# Patient Record
Sex: Female | Born: 1940 | Race: White | Hispanic: No | Smoking: Never smoker
Health system: Southern US, Community
[De-identification: ages and names within clinical notes are randomized; demographics above are authoritative.]

## PROBLEM LIST (undated history)

## (undated) DIAGNOSIS — IMO0001 Reserved for inherently not codable concepts without codable children: Secondary | ICD-10-CM

## (undated) DIAGNOSIS — I5189 Other ill-defined heart diseases: Secondary | ICD-10-CM

## (undated) DIAGNOSIS — I2721 Secondary pulmonary arterial hypertension: Secondary | ICD-10-CM

## (undated) DIAGNOSIS — Z87442 Personal history of urinary calculi: Secondary | ICD-10-CM

## (undated) DIAGNOSIS — I10 Essential (primary) hypertension: Secondary | ICD-10-CM

## (undated) DIAGNOSIS — M1990 Unspecified osteoarthritis, unspecified site: Secondary | ICD-10-CM

## (undated) DIAGNOSIS — N6320 Unspecified lump in the left breast, unspecified quadrant: Secondary | ICD-10-CM

## (undated) DIAGNOSIS — D649 Anemia, unspecified: Secondary | ICD-10-CM

## (undated) DIAGNOSIS — R079 Chest pain, unspecified: Secondary | ICD-10-CM

## (undated) DIAGNOSIS — F419 Anxiety disorder, unspecified: Secondary | ICD-10-CM

## (undated) DIAGNOSIS — I4510 Unspecified right bundle-branch block: Secondary | ICD-10-CM

## (undated) DIAGNOSIS — G4730 Sleep apnea, unspecified: Secondary | ICD-10-CM

## (undated) DIAGNOSIS — R002 Palpitations: Secondary | ICD-10-CM

## (undated) DIAGNOSIS — K219 Gastro-esophageal reflux disease without esophagitis: Secondary | ICD-10-CM

## (undated) DIAGNOSIS — E785 Hyperlipidemia, unspecified: Secondary | ICD-10-CM

## (undated) DIAGNOSIS — J302 Other seasonal allergic rhinitis: Secondary | ICD-10-CM

## (undated) HISTORY — DX: Palpitations: R00.2

## (undated) HISTORY — DX: Unspecified right bundle-branch block: I45.10

## (undated) HISTORY — DX: Secondary pulmonary arterial hypertension: I27.21

## (undated) HISTORY — DX: Chest pain, unspecified: R07.9

## (undated) HISTORY — DX: Reserved for inherently not codable concepts without codable children: IMO0001

## (undated) HISTORY — DX: Gastro-esophageal reflux disease without esophagitis: K21.9

## (undated) HISTORY — PX: OTHER SURGICAL HISTORY: SHX169

## (undated) HISTORY — DX: Other ill-defined heart diseases: I51.89

## (undated) HISTORY — DX: Essential (primary) hypertension: I10

## (undated) HISTORY — PX: SHOULDER SURGERY: SHX246

---

## 2000-04-23 ENCOUNTER — Encounter: Admission: RE | Admit: 2000-04-23 | Discharge: 2000-07-22 | Payer: Self-pay | Admitting: Family Medicine

## 2000-07-05 ENCOUNTER — Other Ambulatory Visit: Admission: RE | Admit: 2000-07-05 | Discharge: 2000-07-05 | Payer: Self-pay | Admitting: Family Medicine

## 2000-07-09 ENCOUNTER — Encounter: Admission: RE | Admit: 2000-07-09 | Discharge: 2000-07-09 | Payer: Self-pay | Admitting: Family Medicine

## 2000-07-09 ENCOUNTER — Encounter: Payer: Self-pay | Admitting: Family Medicine

## 2000-08-22 ENCOUNTER — Encounter: Payer: Self-pay | Admitting: Urology

## 2000-08-22 ENCOUNTER — Ambulatory Visit (HOSPITAL_COMMUNITY): Admission: RE | Admit: 2000-08-22 | Discharge: 2000-08-22 | Payer: Self-pay | Admitting: Urology

## 2002-01-07 ENCOUNTER — Other Ambulatory Visit: Admission: RE | Admit: 2002-01-07 | Discharge: 2002-01-07 | Payer: Self-pay | Admitting: Family Medicine

## 2002-04-22 ENCOUNTER — Encounter: Payer: Self-pay | Admitting: Family Medicine

## 2002-04-22 ENCOUNTER — Encounter: Admission: RE | Admit: 2002-04-22 | Discharge: 2002-04-22 | Payer: Self-pay | Admitting: Family Medicine

## 2003-10-07 ENCOUNTER — Encounter: Payer: Self-pay | Admitting: Family Medicine

## 2003-10-07 ENCOUNTER — Encounter: Admission: RE | Admit: 2003-10-07 | Discharge: 2003-10-07 | Payer: Self-pay | Admitting: Family Medicine

## 2004-05-26 ENCOUNTER — Ambulatory Visit (HOSPITAL_COMMUNITY): Admission: RE | Admit: 2004-05-26 | Discharge: 2004-05-26 | Payer: Self-pay | Admitting: Urology

## 2004-06-30 ENCOUNTER — Ambulatory Visit (HOSPITAL_COMMUNITY): Admission: RE | Admit: 2004-06-30 | Discharge: 2004-06-30 | Payer: Self-pay | Admitting: Urology

## 2007-02-11 ENCOUNTER — Encounter: Admission: RE | Admit: 2007-02-11 | Discharge: 2007-02-11 | Payer: Self-pay | Admitting: Family Medicine

## 2007-06-26 ENCOUNTER — Ambulatory Visit (HOSPITAL_BASED_OUTPATIENT_CLINIC_OR_DEPARTMENT_OTHER): Admission: RE | Admit: 2007-06-26 | Discharge: 2007-06-26 | Payer: Self-pay | Admitting: Orthopedic Surgery

## 2011-03-21 ENCOUNTER — Other Ambulatory Visit: Payer: Self-pay | Admitting: Podiatry

## 2011-03-21 DIAGNOSIS — M25571 Pain in right ankle and joints of right foot: Secondary | ICD-10-CM

## 2011-03-24 ENCOUNTER — Ambulatory Visit
Admission: RE | Admit: 2011-03-24 | Discharge: 2011-03-24 | Disposition: A | Discharge status: Home / Self Care | Payer: BLUE CROSS/BLUE SHIELD | Source: Ambulatory Visit | Attending: Podiatry | Admitting: Podiatry

## 2011-03-24 DIAGNOSIS — M25571 Pain in right ankle and joints of right foot: Secondary | ICD-10-CM

## 2011-05-02 NOTE — Op Note (Signed)
NAME:  Laura Garner, Laura Garner                  ACCOUNT NO.:  [id number]   MEDICAL RECORD NO.:  [id number]          PATIENT TYPE:  AMB   LOCATION:  DSC                          FACILITY:  [facility]   PHYSICIAN:  Mila Homer. Sherlean Foot, M.D. DATE OF BIRTH:  27-Aug-1941   DATE OF PROCEDURE:  06/26/2007  DATE OF DISCHARGE:                               OPERATIVE REPORT   SURGEON:  Mila Homer. Sherlean Foot, M.D.   ASSISTANT:  None.   ANESTHESIA:  General.   PREOPERATIVE DIAGNOSIS:  Left shoulder impingement syndrome and  osteoarthritis.   POSTOPERATIVE DIAGNOSIS:  Left shoulder impingement syndrome and  osteoarthritis.   PROCEDURE:  Left shoulder arthroscopy with subacromial decompression,  glenohumeral debridement and distal clavicle resection.   INDICATIONS FOR PROCEDURE:  The patient is a 70 year old white female  with failure of conservative measures for shoulder impingement syndrome.  Informed consent obtained.   DESCRIPTION OF PROCEDURE:  The patient was taken to operating room,  administered general anesthesia, placed in beach-chair position.  Left  shoulder was prepped and draped in the usual sterile fashion.  Anterior-  posterior and direct lateral portals were created with a #11 blade,  blunt trocar and cannula.  Diagnostic arthroscopy revealed some  undersurface tearing of the rotator cuff.  Of note is that she did have  arthrofibrosis as well and I did perform a preoperative manipulation.  I  then debrided some of the scar tissue in the joint.  At this point I  redirected scope into the subacromial space looking from the direct  lateral portal, performed a subacromial decompression by removing the  bursa and then used the 4.0 mm cylindrical bur to perform an  anterolateral acromioplasty and distal clavicle resection.  I used the  ArthroCare debridement wand to clean off the undersurface of the bone as  well as released the CA ligament.  I then lavaged and closed with 4-0  nylon sutures, dressed  with Xeroform dressing sponges, 2-inch silk tape  and a simple sling.   COMPLICATIONS:  None.   DRAINS:  None.           ______________________________  Mila Homer. Sherlean Foot, M.D.     SDL/MEDQ  D:  06/26/2007  T:  06/26/2007  Job:  [id number]

## 2011-05-05 NOTE — Op Note (Signed)
NAME:  REESHEMAH, NAZARYAN                            ACCOUNT NO.:  [id number]   MEDICAL RECORD NO.:  [id number]                   PATIENT TYPE:  AMB   LOCATION:  DAY                                  FACILITY:  [facility]   PHYSICIAN:  Maretta Bees. Vonita Moss, M.D.             DATE OF BIRTH:  [birth year]   DATE OF PROCEDURE:  05/26/2004  DATE OF DISCHARGE:                                 OPERATIVE REPORT   PREOPERATIVE DIAGNOSES:  Large right renal calculi.   POSTOPERATIVE DIAGNOSES:  Large right renal calculi.   PROCEDURE:  Cystoscopy, right retrograde pyelogram with interpretation and  right double J catheter placement.   SURGEON:  Maretta Bees. Vonita Moss, M.D.   ANESTHESIA:  General.   INDICATIONS FOR PROCEDURE:  This 70 year old lady has had right flank pain  and history of stones and microscopic hematuria. She has got large right  renal calculi that looked fairly soft, I believe these need removal and  discussion of percutaneous nephrolithotomy versus ESL's was discussed and  she opted for an ESL as her initial approach. With the stone burden, I felt  like she needed a right double J catheter placed before ESL and therefore  she is brought to the OR this morning.   DESCRIPTION OF PROCEDURE:  The patient was brought to the operating room,  placed in lithotomy position, external genitalia were prepped and draped in  the usual fashion.  She was cystoscoped and the bladder was unremarkable.  A  guidewire was placed up the right ureter and then an open ended ureteral  catheter was positioned and a retrograde pyelogram obtained demonstrating  these stones in the pyelocaliceal system and a slightly dilated right upper  ureter.  A 26 cm 6 [place] double J catheter was then placed easily in  position with a full coil in the renal pelvis and a full coil in the  bladder. The bladder was emptied, the scope removed and the patient sent to  the recovery room in good condition having tolerated the procedure well  and  she will undergo ESL later this morning.                                               Maretta Bees. Vonita Moss, M.D.    LJP/MEDQ  D:  05/26/2004  T:  05/26/2004  Job:  [id number]

## 2011-05-05 NOTE — Op Note (Signed)
[place]  Patient:    Laura Garner, Laura Garner                         MRN: [id number] Proc. Date: 08/22/00 Adm. Date:  [id number] Attending:  Lauree Chandler                           Operative Report  PREOPERATIVE DIAGNOSIS:  Microhematuria.  POSTOPERATIVE DIAGNOSIS:  Microhematuria and mild urethral stenosis.  OPERATION PERFORMED:  Cystoscopy, urethral dilation, and bilateral retrograde pyelograms with interpretation.  SURGEON:  Maretta Bees. Vonita Moss, M.D.  ANESTHESIA:  General.  INDICATIONS FOR PROCEDURE:  This 70 year old lady has had persistent microscopic hematuria.  She is allergic to IV contrast.  She has had renal ultrasounds that have shown no masses, stones or hydronephrosis.  Cystoscopy earlier this year was negative.  Because of persistent red blood cells, she is brought to the operating room today to evaluate her upper tracts by means of retrograde pyelograms.  She also has normal renal function.  DESCRIPTION OF PROCEDURE:  The patient was brought to the operating room and placed in lithotomy position.  The external genitalia were prepped and draped in the usual fashion.  The cystoscope was snug, so I dilated her to 36 [place] with female sounds and then inserted the 25 [place] cystoscope.  The bladder had no stones, tumors or inflammatory lesions.  Bilateral retrograde pyelograms were obtained under fluoroscopic control using a 5 French whistle tip ureteral catheter.  The upper tracts were not obstructed with no filling defects or other lesions.  The bladder was then emptied, the scope removed. The patient was transferred to the recovery room in good condition. DD:  08/22/00 TD:  08/22/00 Job: [id number]

## 2011-07-27 DIAGNOSIS — R002 Palpitations: Secondary | ICD-10-CM

## 2011-07-27 DIAGNOSIS — IMO0001 Reserved for inherently not codable concepts without codable children: Secondary | ICD-10-CM

## 2011-07-27 DIAGNOSIS — E119 Type 2 diabetes mellitus without complications: Secondary | ICD-10-CM | POA: Insufficient documentation

## 2011-07-27 DIAGNOSIS — M66879 Spontaneous rupture of other tendons, unspecified ankle and foot: Secondary | ICD-10-CM

## 2011-07-27 DIAGNOSIS — I10 Essential (primary) hypertension: Secondary | ICD-10-CM

## 2011-10-03 LAB — BASIC METABOLIC PANEL
BUN: 24 — ABNORMAL HIGH
Creatinine, Ser: 1.41 — ABNORMAL HIGH
GFR calc non Af Amer: 37 — ABNORMAL LOW

## 2011-10-03 LAB — POCT HEMOGLOBIN-HEMACUE
Hemoglobin: 10.2 — ABNORMAL LOW
Operator id: 116011

## 2012-01-21 DIAGNOSIS — J069 Acute upper respiratory infection, unspecified: Secondary | ICD-10-CM | POA: Diagnosis not present

## 2012-03-12 DIAGNOSIS — Z79899 Other long term (current) drug therapy: Secondary | ICD-10-CM | POA: Diagnosis not present

## 2012-03-12 DIAGNOSIS — E1149 Type 2 diabetes mellitus with other diabetic neurological complication: Secondary | ICD-10-CM | POA: Diagnosis not present

## 2012-03-12 DIAGNOSIS — E782 Mixed hyperlipidemia: Secondary | ICD-10-CM | POA: Diagnosis not present

## 2012-03-18 DIAGNOSIS — E782 Mixed hyperlipidemia: Secondary | ICD-10-CM | POA: Diagnosis not present

## 2012-03-18 DIAGNOSIS — I10 Essential (primary) hypertension: Secondary | ICD-10-CM | POA: Diagnosis not present

## 2012-03-18 DIAGNOSIS — E1149 Type 2 diabetes mellitus with other diabetic neurological complication: Secondary | ICD-10-CM | POA: Diagnosis not present

## 2012-03-28 DIAGNOSIS — M9981 Other biomechanical lesions of cervical region: Secondary | ICD-10-CM | POA: Diagnosis not present

## 2012-03-28 DIAGNOSIS — M5137 Other intervertebral disc degeneration, lumbosacral region: Secondary | ICD-10-CM | POA: Diagnosis not present

## 2012-03-28 DIAGNOSIS — M999 Biomechanical lesion, unspecified: Secondary | ICD-10-CM | POA: Diagnosis not present

## 2012-04-10 DIAGNOSIS — I10 Essential (primary) hypertension: Secondary | ICD-10-CM | POA: Diagnosis not present

## 2012-04-10 DIAGNOSIS — R609 Edema, unspecified: Secondary | ICD-10-CM | POA: Diagnosis not present

## 2012-06-05 DIAGNOSIS — M999 Biomechanical lesion, unspecified: Secondary | ICD-10-CM | POA: Diagnosis not present

## 2012-06-05 DIAGNOSIS — M9981 Other biomechanical lesions of cervical region: Secondary | ICD-10-CM | POA: Diagnosis not present

## 2012-06-05 DIAGNOSIS — M5412 Radiculopathy, cervical region: Secondary | ICD-10-CM | POA: Diagnosis not present

## 2012-06-13 DIAGNOSIS — M5412 Radiculopathy, cervical region: Secondary | ICD-10-CM | POA: Diagnosis not present

## 2012-06-13 DIAGNOSIS — M999 Biomechanical lesion, unspecified: Secondary | ICD-10-CM | POA: Diagnosis not present

## 2012-06-13 DIAGNOSIS — M9981 Other biomechanical lesions of cervical region: Secondary | ICD-10-CM | POA: Diagnosis not present

## 2012-06-25 DIAGNOSIS — M999 Biomechanical lesion, unspecified: Secondary | ICD-10-CM | POA: Diagnosis not present

## 2012-06-25 DIAGNOSIS — M5412 Radiculopathy, cervical region: Secondary | ICD-10-CM | POA: Diagnosis not present

## 2012-06-25 DIAGNOSIS — M9981 Other biomechanical lesions of cervical region: Secondary | ICD-10-CM | POA: Diagnosis not present

## 2012-06-27 DIAGNOSIS — M5137 Other intervertebral disc degeneration, lumbosacral region: Secondary | ICD-10-CM | POA: Diagnosis not present

## 2012-06-27 DIAGNOSIS — M542 Cervicalgia: Secondary | ICD-10-CM | POA: Diagnosis not present

## 2012-07-18 DIAGNOSIS — E782 Mixed hyperlipidemia: Secondary | ICD-10-CM | POA: Diagnosis not present

## 2012-07-18 DIAGNOSIS — Z79899 Other long term (current) drug therapy: Secondary | ICD-10-CM | POA: Diagnosis not present

## 2012-07-18 DIAGNOSIS — E1149 Type 2 diabetes mellitus with other diabetic neurological complication: Secondary | ICD-10-CM | POA: Diagnosis not present

## 2012-07-18 DIAGNOSIS — D649 Anemia, unspecified: Secondary | ICD-10-CM | POA: Diagnosis not present

## 2012-07-25 DIAGNOSIS — E7800 Pure hypercholesterolemia, unspecified: Secondary | ICD-10-CM | POA: Diagnosis not present

## 2012-07-25 DIAGNOSIS — I10 Essential (primary) hypertension: Secondary | ICD-10-CM | POA: Diagnosis not present

## 2012-07-25 DIAGNOSIS — E1149 Type 2 diabetes mellitus with other diabetic neurological complication: Secondary | ICD-10-CM | POA: Diagnosis not present

## 2012-07-25 DIAGNOSIS — D649 Anemia, unspecified: Secondary | ICD-10-CM | POA: Diagnosis not present

## 2012-08-29 DIAGNOSIS — M9981 Other biomechanical lesions of cervical region: Secondary | ICD-10-CM | POA: Diagnosis not present

## 2012-08-29 DIAGNOSIS — M5412 Radiculopathy, cervical region: Secondary | ICD-10-CM | POA: Diagnosis not present

## 2012-08-29 DIAGNOSIS — M999 Biomechanical lesion, unspecified: Secondary | ICD-10-CM | POA: Diagnosis not present

## 2012-09-11 DIAGNOSIS — J0100 Acute maxillary sinusitis, unspecified: Secondary | ICD-10-CM | POA: Diagnosis not present

## 2012-09-11 DIAGNOSIS — E119 Type 2 diabetes mellitus without complications: Secondary | ICD-10-CM | POA: Diagnosis not present

## 2012-10-29 DIAGNOSIS — Z23 Encounter for immunization: Secondary | ICD-10-CM | POA: Diagnosis not present

## 2012-11-20 DIAGNOSIS — M999 Biomechanical lesion, unspecified: Secondary | ICD-10-CM | POA: Diagnosis not present

## 2012-11-20 DIAGNOSIS — M9981 Other biomechanical lesions of cervical region: Secondary | ICD-10-CM | POA: Diagnosis not present

## 2012-11-20 DIAGNOSIS — M546 Pain in thoracic spine: Secondary | ICD-10-CM | POA: Diagnosis not present

## 2012-11-25 DIAGNOSIS — M999 Biomechanical lesion, unspecified: Secondary | ICD-10-CM | POA: Diagnosis not present

## 2012-11-25 DIAGNOSIS — M546 Pain in thoracic spine: Secondary | ICD-10-CM | POA: Diagnosis not present

## 2012-11-25 DIAGNOSIS — M9981 Other biomechanical lesions of cervical region: Secondary | ICD-10-CM | POA: Diagnosis not present

## 2012-11-27 DIAGNOSIS — E782 Mixed hyperlipidemia: Secondary | ICD-10-CM | POA: Diagnosis not present

## 2012-11-27 DIAGNOSIS — D649 Anemia, unspecified: Secondary | ICD-10-CM | POA: Diagnosis not present

## 2012-11-27 DIAGNOSIS — E1149 Type 2 diabetes mellitus with other diabetic neurological complication: Secondary | ICD-10-CM | POA: Diagnosis not present

## 2012-12-02 DIAGNOSIS — E782 Mixed hyperlipidemia: Secondary | ICD-10-CM | POA: Diagnosis not present

## 2012-12-02 DIAGNOSIS — I10 Essential (primary) hypertension: Secondary | ICD-10-CM | POA: Diagnosis not present

## 2012-12-02 DIAGNOSIS — E1149 Type 2 diabetes mellitus with other diabetic neurological complication: Secondary | ICD-10-CM | POA: Diagnosis not present

## 2012-12-05 DIAGNOSIS — M999 Biomechanical lesion, unspecified: Secondary | ICD-10-CM | POA: Diagnosis not present

## 2012-12-05 DIAGNOSIS — M9981 Other biomechanical lesions of cervical region: Secondary | ICD-10-CM | POA: Diagnosis not present

## 2012-12-05 DIAGNOSIS — M546 Pain in thoracic spine: Secondary | ICD-10-CM | POA: Diagnosis not present

## 2012-12-26 DIAGNOSIS — M5412 Radiculopathy, cervical region: Secondary | ICD-10-CM | POA: Diagnosis not present

## 2012-12-26 DIAGNOSIS — M9981 Other biomechanical lesions of cervical region: Secondary | ICD-10-CM | POA: Diagnosis not present

## 2012-12-26 DIAGNOSIS — M999 Biomechanical lesion, unspecified: Secondary | ICD-10-CM | POA: Diagnosis not present

## 2013-01-18 DIAGNOSIS — J0100 Acute maxillary sinusitis, unspecified: Secondary | ICD-10-CM | POA: Diagnosis not present

## 2013-01-21 DIAGNOSIS — M5412 Radiculopathy, cervical region: Secondary | ICD-10-CM | POA: Diagnosis not present

## 2013-01-21 DIAGNOSIS — M9981 Other biomechanical lesions of cervical region: Secondary | ICD-10-CM | POA: Diagnosis not present

## 2013-01-21 DIAGNOSIS — M999 Biomechanical lesion, unspecified: Secondary | ICD-10-CM | POA: Diagnosis not present

## 2013-02-19 DIAGNOSIS — M999 Biomechanical lesion, unspecified: Secondary | ICD-10-CM | POA: Diagnosis not present

## 2013-02-19 DIAGNOSIS — M9981 Other biomechanical lesions of cervical region: Secondary | ICD-10-CM | POA: Diagnosis not present

## 2013-03-24 DIAGNOSIS — J069 Acute upper respiratory infection, unspecified: Secondary | ICD-10-CM | POA: Diagnosis not present

## 2013-04-10 DIAGNOSIS — E782 Mixed hyperlipidemia: Secondary | ICD-10-CM | POA: Diagnosis not present

## 2013-04-10 DIAGNOSIS — E1149 Type 2 diabetes mellitus with other diabetic neurological complication: Secondary | ICD-10-CM | POA: Diagnosis not present

## 2013-04-14 DIAGNOSIS — E1149 Type 2 diabetes mellitus with other diabetic neurological complication: Secondary | ICD-10-CM | POA: Diagnosis not present

## 2013-04-14 DIAGNOSIS — Z1331 Encounter for screening for depression: Secondary | ICD-10-CM | POA: Diagnosis not present

## 2013-04-14 DIAGNOSIS — Z23 Encounter for immunization: Secondary | ICD-10-CM | POA: Diagnosis not present

## 2013-04-14 DIAGNOSIS — Z9181 History of falling: Secondary | ICD-10-CM | POA: Diagnosis not present

## 2013-04-28 DIAGNOSIS — M899 Disorder of bone, unspecified: Secondary | ICD-10-CM | POA: Diagnosis not present

## 2013-04-28 DIAGNOSIS — M949 Disorder of cartilage, unspecified: Secondary | ICD-10-CM | POA: Diagnosis not present

## 2013-04-28 DIAGNOSIS — Z1231 Encounter for screening mammogram for malignant neoplasm of breast: Secondary | ICD-10-CM | POA: Diagnosis not present

## 2013-06-03 DIAGNOSIS — J0100 Acute maxillary sinusitis, unspecified: Secondary | ICD-10-CM | POA: Diagnosis not present

## 2013-06-03 DIAGNOSIS — J209 Acute bronchitis, unspecified: Secondary | ICD-10-CM | POA: Diagnosis not present

## 2013-06-03 DIAGNOSIS — H66009 Acute suppurative otitis media without spontaneous rupture of ear drum, unspecified ear: Secondary | ICD-10-CM | POA: Diagnosis not present

## 2013-08-05 DIAGNOSIS — IMO0002 Reserved for concepts with insufficient information to code with codable children: Secondary | ICD-10-CM | POA: Diagnosis not present

## 2013-08-05 DIAGNOSIS — M999 Biomechanical lesion, unspecified: Secondary | ICD-10-CM | POA: Diagnosis not present

## 2013-08-05 DIAGNOSIS — M9981 Other biomechanical lesions of cervical region: Secondary | ICD-10-CM | POA: Diagnosis not present

## 2013-08-11 DIAGNOSIS — M9981 Other biomechanical lesions of cervical region: Secondary | ICD-10-CM | POA: Diagnosis not present

## 2013-08-11 DIAGNOSIS — M999 Biomechanical lesion, unspecified: Secondary | ICD-10-CM | POA: Diagnosis not present

## 2013-08-11 DIAGNOSIS — IMO0002 Reserved for concepts with insufficient information to code with codable children: Secondary | ICD-10-CM | POA: Diagnosis not present

## 2013-08-14 DIAGNOSIS — E1149 Type 2 diabetes mellitus with other diabetic neurological complication: Secondary | ICD-10-CM | POA: Diagnosis not present

## 2013-08-14 DIAGNOSIS — D649 Anemia, unspecified: Secondary | ICD-10-CM | POA: Diagnosis not present

## 2013-08-14 DIAGNOSIS — E782 Mixed hyperlipidemia: Secondary | ICD-10-CM | POA: Diagnosis not present

## 2013-08-20 DIAGNOSIS — I10 Essential (primary) hypertension: Secondary | ICD-10-CM | POA: Diagnosis not present

## 2013-08-20 DIAGNOSIS — E1149 Type 2 diabetes mellitus with other diabetic neurological complication: Secondary | ICD-10-CM | POA: Diagnosis not present

## 2013-08-20 DIAGNOSIS — E782 Mixed hyperlipidemia: Secondary | ICD-10-CM | POA: Diagnosis not present

## 2013-09-03 DIAGNOSIS — IMO0002 Reserved for concepts with insufficient information to code with codable children: Secondary | ICD-10-CM | POA: Diagnosis not present

## 2013-09-03 DIAGNOSIS — M999 Biomechanical lesion, unspecified: Secondary | ICD-10-CM | POA: Diagnosis not present

## 2013-09-03 DIAGNOSIS — M9981 Other biomechanical lesions of cervical region: Secondary | ICD-10-CM | POA: Diagnosis not present

## 2013-10-05 DIAGNOSIS — Z23 Encounter for immunization: Secondary | ICD-10-CM | POA: Diagnosis not present

## 2013-10-09 DIAGNOSIS — IMO0002 Reserved for concepts with insufficient information to code with codable children: Secondary | ICD-10-CM | POA: Diagnosis not present

## 2013-10-09 DIAGNOSIS — M546 Pain in thoracic spine: Secondary | ICD-10-CM | POA: Diagnosis not present

## 2013-10-09 DIAGNOSIS — M9981 Other biomechanical lesions of cervical region: Secondary | ICD-10-CM | POA: Diagnosis not present

## 2013-10-09 DIAGNOSIS — M999 Biomechanical lesion, unspecified: Secondary | ICD-10-CM | POA: Diagnosis not present

## 2013-10-09 DIAGNOSIS — G44209 Tension-type headache, unspecified, not intractable: Secondary | ICD-10-CM | POA: Diagnosis not present

## 2013-11-18 DIAGNOSIS — M542 Cervicalgia: Secondary | ICD-10-CM | POA: Diagnosis not present

## 2013-11-18 DIAGNOSIS — M999 Biomechanical lesion, unspecified: Secondary | ICD-10-CM | POA: Diagnosis not present

## 2013-11-18 DIAGNOSIS — M25559 Pain in unspecified hip: Secondary | ICD-10-CM | POA: Diagnosis not present

## 2013-11-18 DIAGNOSIS — M546 Pain in thoracic spine: Secondary | ICD-10-CM | POA: Diagnosis not present

## 2013-11-18 DIAGNOSIS — M9981 Other biomechanical lesions of cervical region: Secondary | ICD-10-CM | POA: Diagnosis not present

## 2013-11-18 DIAGNOSIS — IMO0002 Reserved for concepts with insufficient information to code with codable children: Secondary | ICD-10-CM | POA: Diagnosis not present

## 2013-12-02 DIAGNOSIS — J0100 Acute maxillary sinusitis, unspecified: Secondary | ICD-10-CM | POA: Diagnosis not present

## 2013-12-05 DIAGNOSIS — M546 Pain in thoracic spine: Secondary | ICD-10-CM | POA: Diagnosis not present

## 2013-12-05 DIAGNOSIS — M542 Cervicalgia: Secondary | ICD-10-CM | POA: Diagnosis not present

## 2013-12-05 DIAGNOSIS — M999 Biomechanical lesion, unspecified: Secondary | ICD-10-CM | POA: Diagnosis not present

## 2013-12-05 DIAGNOSIS — M9981 Other biomechanical lesions of cervical region: Secondary | ICD-10-CM | POA: Diagnosis not present

## 2013-12-05 DIAGNOSIS — IMO0002 Reserved for concepts with insufficient information to code with codable children: Secondary | ICD-10-CM | POA: Diagnosis not present

## 2013-12-05 DIAGNOSIS — M25559 Pain in unspecified hip: Secondary | ICD-10-CM | POA: Diagnosis not present

## 2013-12-24 DIAGNOSIS — Z79899 Other long term (current) drug therapy: Secondary | ICD-10-CM | POA: Diagnosis not present

## 2013-12-24 DIAGNOSIS — D649 Anemia, unspecified: Secondary | ICD-10-CM | POA: Diagnosis not present

## 2013-12-24 DIAGNOSIS — E1149 Type 2 diabetes mellitus with other diabetic neurological complication: Secondary | ICD-10-CM | POA: Diagnosis not present

## 2013-12-24 DIAGNOSIS — E782 Mixed hyperlipidemia: Secondary | ICD-10-CM | POA: Diagnosis not present

## 2013-12-28 DIAGNOSIS — J069 Acute upper respiratory infection, unspecified: Secondary | ICD-10-CM | POA: Diagnosis not present

## 2013-12-30 DIAGNOSIS — D649 Anemia, unspecified: Secondary | ICD-10-CM | POA: Diagnosis not present

## 2013-12-30 DIAGNOSIS — Z1331 Encounter for screening for depression: Secondary | ICD-10-CM | POA: Diagnosis not present

## 2013-12-30 DIAGNOSIS — E1149 Type 2 diabetes mellitus with other diabetic neurological complication: Secondary | ICD-10-CM | POA: Diagnosis not present

## 2013-12-30 DIAGNOSIS — E782 Mixed hyperlipidemia: Secondary | ICD-10-CM | POA: Diagnosis not present

## 2013-12-30 DIAGNOSIS — I10 Essential (primary) hypertension: Secondary | ICD-10-CM | POA: Diagnosis not present

## 2013-12-30 DIAGNOSIS — N183 Chronic kidney disease, stage 3 unspecified: Secondary | ICD-10-CM | POA: Diagnosis not present

## 2013-12-30 DIAGNOSIS — Z9181 History of falling: Secondary | ICD-10-CM | POA: Diagnosis not present

## 2013-12-30 DIAGNOSIS — Z6838 Body mass index (BMI) 38.0-38.9, adult: Secondary | ICD-10-CM | POA: Diagnosis not present

## 2014-01-20 DIAGNOSIS — M9981 Other biomechanical lesions of cervical region: Secondary | ICD-10-CM | POA: Diagnosis not present

## 2014-01-20 DIAGNOSIS — M5412 Radiculopathy, cervical region: Secondary | ICD-10-CM | POA: Diagnosis not present

## 2014-01-20 DIAGNOSIS — M999 Biomechanical lesion, unspecified: Secondary | ICD-10-CM | POA: Diagnosis not present

## 2014-05-04 DIAGNOSIS — J019 Acute sinusitis, unspecified: Secondary | ICD-10-CM | POA: Diagnosis not present

## 2014-05-18 DIAGNOSIS — D649 Anemia, unspecified: Secondary | ICD-10-CM | POA: Diagnosis not present

## 2014-05-18 DIAGNOSIS — E1149 Type 2 diabetes mellitus with other diabetic neurological complication: Secondary | ICD-10-CM | POA: Diagnosis not present

## 2014-05-18 DIAGNOSIS — E782 Mixed hyperlipidemia: Secondary | ICD-10-CM | POA: Diagnosis not present

## 2014-05-22 DIAGNOSIS — D649 Anemia, unspecified: Secondary | ICD-10-CM | POA: Diagnosis not present

## 2014-05-22 DIAGNOSIS — N183 Chronic kidney disease, stage 3 unspecified: Secondary | ICD-10-CM | POA: Diagnosis not present

## 2014-05-22 DIAGNOSIS — E1149 Type 2 diabetes mellitus with other diabetic neurological complication: Secondary | ICD-10-CM | POA: Diagnosis not present

## 2014-05-22 DIAGNOSIS — Z23 Encounter for immunization: Secondary | ICD-10-CM | POA: Diagnosis not present

## 2014-05-22 DIAGNOSIS — I10 Essential (primary) hypertension: Secondary | ICD-10-CM | POA: Diagnosis not present

## 2014-05-22 DIAGNOSIS — E782 Mixed hyperlipidemia: Secondary | ICD-10-CM | POA: Diagnosis not present

## 2014-06-30 DIAGNOSIS — H2510 Age-related nuclear cataract, unspecified eye: Secondary | ICD-10-CM | POA: Diagnosis not present

## 2014-07-15 DIAGNOSIS — IMO0002 Reserved for concepts with insufficient information to code with codable children: Secondary | ICD-10-CM | POA: Diagnosis not present

## 2014-07-15 DIAGNOSIS — M999 Biomechanical lesion, unspecified: Secondary | ICD-10-CM | POA: Diagnosis not present

## 2014-07-15 DIAGNOSIS — M9981 Other biomechanical lesions of cervical region: Secondary | ICD-10-CM | POA: Diagnosis not present

## 2014-07-15 DIAGNOSIS — R51 Headache: Secondary | ICD-10-CM | POA: Diagnosis not present

## 2014-07-21 DIAGNOSIS — R609 Edema, unspecified: Secondary | ICD-10-CM | POA: Diagnosis not present

## 2014-09-23 DIAGNOSIS — M9902 Segmental and somatic dysfunction of thoracic region: Secondary | ICD-10-CM | POA: Diagnosis not present

## 2014-09-23 DIAGNOSIS — M9901 Segmental and somatic dysfunction of cervical region: Secondary | ICD-10-CM | POA: Diagnosis not present

## 2014-09-23 DIAGNOSIS — E1140 Type 2 diabetes mellitus with diabetic neuropathy, unspecified: Secondary | ICD-10-CM | POA: Diagnosis not present

## 2014-09-23 DIAGNOSIS — M5481 Occipital neuralgia: Secondary | ICD-10-CM | POA: Diagnosis not present

## 2014-09-23 DIAGNOSIS — M545 Low back pain: Secondary | ICD-10-CM | POA: Diagnosis not present

## 2014-09-23 DIAGNOSIS — M9903 Segmental and somatic dysfunction of lumbar region: Secondary | ICD-10-CM | POA: Diagnosis not present

## 2014-09-23 DIAGNOSIS — E782 Mixed hyperlipidemia: Secondary | ICD-10-CM | POA: Diagnosis not present

## 2014-09-23 DIAGNOSIS — M5414 Radiculopathy, thoracic region: Secondary | ICD-10-CM | POA: Diagnosis not present

## 2014-09-23 DIAGNOSIS — N183 Chronic kidney disease, stage 3 (moderate): Secondary | ICD-10-CM | POA: Diagnosis not present

## 2014-09-25 DIAGNOSIS — E782 Mixed hyperlipidemia: Secondary | ICD-10-CM | POA: Diagnosis not present

## 2014-09-25 DIAGNOSIS — N189 Chronic kidney disease, unspecified: Secondary | ICD-10-CM | POA: Diagnosis not present

## 2014-09-25 DIAGNOSIS — E1140 Type 2 diabetes mellitus with diabetic neuropathy, unspecified: Secondary | ICD-10-CM | POA: Diagnosis not present

## 2014-09-25 DIAGNOSIS — I10 Essential (primary) hypertension: Secondary | ICD-10-CM | POA: Diagnosis not present

## 2014-09-25 DIAGNOSIS — Z23 Encounter for immunization: Secondary | ICD-10-CM | POA: Diagnosis not present

## 2014-10-26 DIAGNOSIS — E1122 Type 2 diabetes mellitus with diabetic chronic kidney disease: Secondary | ICD-10-CM | POA: Diagnosis not present

## 2015-01-26 DIAGNOSIS — E782 Mixed hyperlipidemia: Secondary | ICD-10-CM | POA: Diagnosis not present

## 2015-01-26 DIAGNOSIS — Z79899 Other long term (current) drug therapy: Secondary | ICD-10-CM | POA: Diagnosis not present

## 2015-01-26 DIAGNOSIS — E1149 Type 2 diabetes mellitus with other diabetic neurological complication: Secondary | ICD-10-CM | POA: Diagnosis not present

## 2015-01-26 DIAGNOSIS — E1122 Type 2 diabetes mellitus with diabetic chronic kidney disease: Secondary | ICD-10-CM | POA: Diagnosis not present

## 2015-02-01 DIAGNOSIS — E782 Mixed hyperlipidemia: Secondary | ICD-10-CM | POA: Diagnosis not present

## 2015-02-01 DIAGNOSIS — Z9181 History of falling: Secondary | ICD-10-CM | POA: Diagnosis not present

## 2015-02-01 DIAGNOSIS — E1149 Type 2 diabetes mellitus with other diabetic neurological complication: Secondary | ICD-10-CM | POA: Diagnosis not present

## 2015-02-01 DIAGNOSIS — Z1389 Encounter for screening for other disorder: Secondary | ICD-10-CM | POA: Diagnosis not present

## 2015-02-01 DIAGNOSIS — N189 Chronic kidney disease, unspecified: Secondary | ICD-10-CM | POA: Diagnosis not present

## 2015-02-01 DIAGNOSIS — I10 Essential (primary) hypertension: Secondary | ICD-10-CM | POA: Diagnosis not present

## 2015-02-01 DIAGNOSIS — E1122 Type 2 diabetes mellitus with diabetic chronic kidney disease: Secondary | ICD-10-CM | POA: Diagnosis not present

## 2015-03-02 DIAGNOSIS — M9901 Segmental and somatic dysfunction of cervical region: Secondary | ICD-10-CM | POA: Diagnosis not present

## 2015-03-02 DIAGNOSIS — M9903 Segmental and somatic dysfunction of lumbar region: Secondary | ICD-10-CM | POA: Diagnosis not present

## 2015-03-02 DIAGNOSIS — M545 Low back pain: Secondary | ICD-10-CM | POA: Diagnosis not present

## 2015-03-02 DIAGNOSIS — M542 Cervicalgia: Secondary | ICD-10-CM | POA: Diagnosis not present

## 2015-03-03 DIAGNOSIS — Z6841 Body Mass Index (BMI) 40.0 and over, adult: Secondary | ICD-10-CM | POA: Diagnosis not present

## 2015-03-03 DIAGNOSIS — J0100 Acute maxillary sinusitis, unspecified: Secondary | ICD-10-CM | POA: Diagnosis not present

## 2015-03-09 DIAGNOSIS — M9903 Segmental and somatic dysfunction of lumbar region: Secondary | ICD-10-CM | POA: Diagnosis not present

## 2015-03-09 DIAGNOSIS — M9901 Segmental and somatic dysfunction of cervical region: Secondary | ICD-10-CM | POA: Diagnosis not present

## 2015-03-09 DIAGNOSIS — M545 Low back pain: Secondary | ICD-10-CM | POA: Diagnosis not present

## 2015-03-09 DIAGNOSIS — M542 Cervicalgia: Secondary | ICD-10-CM | POA: Diagnosis not present

## 2015-03-22 DIAGNOSIS — M9903 Segmental and somatic dysfunction of lumbar region: Secondary | ICD-10-CM | POA: Diagnosis not present

## 2015-03-22 DIAGNOSIS — M4302 Spondylolysis, cervical region: Secondary | ICD-10-CM | POA: Diagnosis not present

## 2015-03-22 DIAGNOSIS — M9902 Segmental and somatic dysfunction of thoracic region: Secondary | ICD-10-CM | POA: Diagnosis not present

## 2015-03-22 DIAGNOSIS — M4307 Spondylolysis, lumbosacral region: Secondary | ICD-10-CM | POA: Diagnosis not present

## 2015-03-22 DIAGNOSIS — M5013 Cervical disc disorder with radiculopathy, cervicothoracic region: Secondary | ICD-10-CM | POA: Diagnosis not present

## 2015-03-22 DIAGNOSIS — M545 Low back pain: Secondary | ICD-10-CM | POA: Diagnosis not present

## 2015-03-22 DIAGNOSIS — M9901 Segmental and somatic dysfunction of cervical region: Secondary | ICD-10-CM | POA: Diagnosis not present

## 2015-03-22 DIAGNOSIS — M542 Cervicalgia: Secondary | ICD-10-CM | POA: Diagnosis not present

## 2015-04-05 DIAGNOSIS — M9901 Segmental and somatic dysfunction of cervical region: Secondary | ICD-10-CM | POA: Diagnosis not present

## 2015-04-05 DIAGNOSIS — M4307 Spondylolysis, lumbosacral region: Secondary | ICD-10-CM | POA: Diagnosis not present

## 2015-04-05 DIAGNOSIS — M9902 Segmental and somatic dysfunction of thoracic region: Secondary | ICD-10-CM | POA: Diagnosis not present

## 2015-04-05 DIAGNOSIS — M5013 Cervical disc disorder with radiculopathy, cervicothoracic region: Secondary | ICD-10-CM | POA: Diagnosis not present

## 2015-04-05 DIAGNOSIS — M4302 Spondylolysis, cervical region: Secondary | ICD-10-CM | POA: Diagnosis not present

## 2015-04-05 DIAGNOSIS — M9903 Segmental and somatic dysfunction of lumbar region: Secondary | ICD-10-CM | POA: Diagnosis not present

## 2015-05-03 DIAGNOSIS — J0110 Acute frontal sinusitis, unspecified: Secondary | ICD-10-CM | POA: Diagnosis not present

## 2015-05-03 DIAGNOSIS — J0100 Acute maxillary sinusitis, unspecified: Secondary | ICD-10-CM | POA: Diagnosis not present

## 2015-05-03 DIAGNOSIS — Z6841 Body Mass Index (BMI) 40.0 and over, adult: Secondary | ICD-10-CM | POA: Diagnosis not present

## 2015-05-24 DIAGNOSIS — E1149 Type 2 diabetes mellitus with other diabetic neurological complication: Secondary | ICD-10-CM | POA: Diagnosis not present

## 2015-05-24 DIAGNOSIS — E1122 Type 2 diabetes mellitus with diabetic chronic kidney disease: Secondary | ICD-10-CM | POA: Diagnosis not present

## 2015-05-24 DIAGNOSIS — E782 Mixed hyperlipidemia: Secondary | ICD-10-CM | POA: Diagnosis not present

## 2015-05-31 DIAGNOSIS — I10 Essential (primary) hypertension: Secondary | ICD-10-CM | POA: Diagnosis not present

## 2015-05-31 DIAGNOSIS — M79671 Pain in right foot: Secondary | ICD-10-CM | POA: Diagnosis not present

## 2015-05-31 DIAGNOSIS — E1149 Type 2 diabetes mellitus with other diabetic neurological complication: Secondary | ICD-10-CM | POA: Diagnosis not present

## 2015-05-31 DIAGNOSIS — N182 Chronic kidney disease, stage 2 (mild): Secondary | ICD-10-CM | POA: Diagnosis not present

## 2015-05-31 DIAGNOSIS — N189 Chronic kidney disease, unspecified: Secondary | ICD-10-CM | POA: Diagnosis not present

## 2015-05-31 DIAGNOSIS — Z1389 Encounter for screening for other disorder: Secondary | ICD-10-CM | POA: Diagnosis not present

## 2015-05-31 DIAGNOSIS — E782 Mixed hyperlipidemia: Secondary | ICD-10-CM | POA: Diagnosis not present

## 2015-06-07 DIAGNOSIS — S92351A Displaced fracture of fifth metatarsal bone, right foot, initial encounter for closed fracture: Secondary | ICD-10-CM | POA: Diagnosis not present

## 2015-06-15 ENCOUNTER — Ambulatory Visit (INDEPENDENT_AMBULATORY_CARE_PROVIDER_SITE_OTHER): Payer: Medicare Other | Admitting: Podiatry

## 2015-06-15 ENCOUNTER — Encounter: Payer: Self-pay | Admitting: Podiatry

## 2015-06-15 ENCOUNTER — Ambulatory Visit (INDEPENDENT_AMBULATORY_CARE_PROVIDER_SITE_OTHER): Payer: Medicare Other

## 2015-06-15 VITALS — BP 127/78 | HR 76 | Resp 16 | Ht 62.0 in | Wt 220.0 lb

## 2015-06-15 DIAGNOSIS — S92301A Fracture of unspecified metatarsal bone(s), right foot, initial encounter for closed fracture: Secondary | ICD-10-CM

## 2015-06-15 DIAGNOSIS — Q6650 Congenital pes planus, unspecified foot: Secondary | ICD-10-CM

## 2015-06-15 DIAGNOSIS — M25572 Pain in left ankle and joints of left foot: Secondary | ICD-10-CM

## 2015-06-15 DIAGNOSIS — G5752 Tarsal tunnel syndrome, left lower limb: Secondary | ICD-10-CM

## 2015-06-17 NOTE — Progress Notes (Signed)
Subjective:     Patient ID: Laura Garner, female   DOB: 06/11/1941, 74 y.o.   MRN: [id number]  HPI 74 year old female presents the office today with her daughter for concerns of left foot pain which has been ongoing for a "few years". She states that she states they had the posterior tibial tendon repair on the right side due to tear and a concern with standing and hopping to the left side. She states that she has pain to the left foot particularly with weightbearing and pressure. She points to the outside aspect of her ankle with majority of her pain is at. She denies any recent injury or trauma to the area. She denies any swelling or any redness overlying the area. Denies any tenderness. She also states approximate one week ago she did fall hurting her right foot for which she went to urgent care was told she had a fracture of the fifth metatarsal. She is placed into a Cam Walker at that time however she was unable to wear it and she has been wearing a regular shoe. She has some discomfort to the area with pressure and weightbearing. No other complaints at this time.  Review of Systems  All other systems reviewed and are negative.      Objective:   Physical Exam AAO x3, NAD DP/PT pulses palpable bilaterally, CRT less than 3 seconds Protective sensation intact with Simms Weinstein monofilament, vibratory sensation intact, Achilles tendon reflex intact On the right foot there is a scar overlying the medial aspect of the ankle from prior surgery which is well healed. There is mild tenderness to palpation on the fifth metatarsal on the distal aspect. There is mild edema overlying the lateral aspect of the foot without any associated erythema or increase in warmth. There is no other areas of tenderness to the right foot/ankle. On the left side there is mild tenderness to palpation overlying the lateral aspect of the foot over the sinus tarsi. There is mild discomfort along the medial aspect of the  ankle on the course the posterior tibial tendon inferior to the medial malleolus and along the insertion into the navicular tuberosity. There is no other areas of tenderness of left foot/ankle. The patient is able to perform a double and single heel rise. There is a decrease in medial arch height upon weightbearing with equinus present. There is decreased range of motion of the subtalar joint without any crepitation. Ankle joint range of motion is intact. There is no overlying edema, erythema, increase in warmth to the left foot/ankle. No areas of tenderness to bilateral lower extremities. MMT 5/5, ROM WNL.  No open lesions or pre-ulcerative lesions.  No overlying edema, erythema, increase in warmth to bilateral lower extremities.  No pain with calf compression, swelling, warmth, erythema bilaterally.      Assessment:     74 year old female with right fifth metatarsal fracture, left symptomatic flatfoot/sinus tarsi pain    Plan:     -X-rays were obtained and reviewed with the patient.  -Treatment options discussed including all alternatives, risks, and complications  1. Right foot 5th metatarsal fracture -Dispensed surgical shoe as she is unable to wear the CAM boot -Ice/elevation -Repeat x-ray 3 weeks.   2. Left foot pain -Likely due to flatfoot. Discussed etiology -Discussed steroid injection into the subtalar joint over the area of maximal tenderness. Risks and complications of the injection were discussed the patient for which she understands and verbally consents to. Under sterile conditions a total of  1 mL mixture of dexamethasone phosphate and 2% lidocaine plain was infiltrated into the lateral aspect of the foot overlying the sinus tarsi and the area of maximal tenderness. A Band-Aid was applied. Patient tolerated the injection well any complications. Post injection care was discussed the patient. -I discussed custom orthotics and possible AFO/[place] brace. I bluntly for her to  follow-up in 3 weeks when Benjie Karvonen is here to be evaluated for this.  -In the meantime, she would likely beenfit from an ankle brace. I believe the one that we have here she is unable to put on site recommended an over-the-counter brace for now.   -Follow-up 3 weeks or sooner if any problems arise. In the meantime, encouraged to call the office with any questions, concerns, change in symptoms.   Celesta Gentile, DPM

## 2015-06-28 ENCOUNTER — Ambulatory Visit: Payer: BLUE CROSS/BLUE SHIELD | Admitting: Podiatry

## 2015-07-01 ENCOUNTER — Encounter: Payer: Self-pay | Admitting: Podiatry

## 2015-07-01 ENCOUNTER — Ambulatory Visit (INDEPENDENT_AMBULATORY_CARE_PROVIDER_SITE_OTHER): Payer: Medicare Other | Admitting: Podiatry

## 2015-07-01 ENCOUNTER — Ambulatory Visit (INDEPENDENT_AMBULATORY_CARE_PROVIDER_SITE_OTHER): Payer: Medicare Other

## 2015-07-01 ENCOUNTER — Encounter: Payer: Medicare Other | Admitting: Podiatry

## 2015-07-01 DIAGNOSIS — S92301D Fracture of unspecified metatarsal bone(s), right foot, subsequent encounter for fracture with routine healing: Secondary | ICD-10-CM | POA: Diagnosis not present

## 2015-07-01 DIAGNOSIS — M79671 Pain in right foot: Secondary | ICD-10-CM

## 2015-07-01 DIAGNOSIS — M2142 Flat foot [pes planus] (acquired), left foot: Secondary | ICD-10-CM | POA: Diagnosis not present

## 2015-07-01 DIAGNOSIS — Q6650 Congenital pes planus, unspecified foot: Secondary | ICD-10-CM | POA: Diagnosis not present

## 2015-07-04 NOTE — Progress Notes (Signed)
Patient ID: Laura Garner, female   DOB: 01-24-1941, 74 y.o.   MRN: [id number]  Subjective: Patient returns the office today with her daughter for follow-up evaluation of the right fifth metatarsal fracture as well as left ankle pain. Since last appointment the injections individually help decrease the painful left side. She does continue some pain to her ankle. The majority of her pain is on the outside aspect of the foot which is mostly resolved. She denies any swelling or redness of the area recently she denies any recent injury or trauma. On the right side she was unable to wear the surgical shoe as well. She continues to do regular shoe. She gets some intermittent discomfort around the outside aspect of the foot. She is also asking her nails to be debrided as they are thickened and elongated and she is unable to trim them herself. No other complaints at this time.   Objective : AAO 3, NAD  Neurovascular status unchanged  On the right foot there is mild tailors palpation along the fifth metatarsal distally. There is mild amount of edema over the area without any assisted erythema or increase in warmth. No other areas of tenderness to the right foot.  On the left side there is decreased tenderness to palpation around the lateral aspect of the foot along the sinus tarsi. There is mild discomfort along the anterior lateral and anteromedial gutters of the ankle joint. There is a significant decrease in medial arch height. Arthritic changes are present. There is a decrease in range of motion of the subtalar joint.  No other areas of tenderness bilateral lower extremities.  No open lesions or pre-ulcerative lesions  Nails are hypertrophic, dystrophic, brittle, elongated 10. There is tenderness to palpation overlying nails 1-5 bilaterally. There is no surrounding erythema or drainage.  No pain with calf compression, swelling, warmth, erythema.   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 There is callus formation around the fifth metatarsal fracture. -Recommended to continue immobilization for the right foot. However this time she does not want any kind of boot or immobilization device. Discussed with her this and make the fracture healing prolonged because further problems. She understands this.  -At this time she was also evaluated by [facility] and she was casted for an [place] brace on the left -Nail sharply debrided 10 without complication/bleeding.  -Follow-up  in 4 weeks or sooner if any problems are to arise. In the meantime I encouraged her to call the office with any questions, concerns, change in symptoms.   Celesta Gentile, DPM

## 2015-07-04 NOTE — Progress Notes (Signed)
Entered in error. Another note already dictated for same day.   

## 2015-07-06 DIAGNOSIS — H524 Presbyopia: Secondary | ICD-10-CM | POA: Diagnosis not present

## 2015-07-06 DIAGNOSIS — H2513 Age-related nuclear cataract, bilateral: Secondary | ICD-10-CM | POA: Diagnosis not present

## 2015-07-29 ENCOUNTER — Ambulatory Visit (INDEPENDENT_AMBULATORY_CARE_PROVIDER_SITE_OTHER): Payer: Medicare Other

## 2015-07-29 ENCOUNTER — Ambulatory Visit: Payer: Medicare Other | Admitting: Podiatry

## 2015-07-29 ENCOUNTER — Ambulatory Visit (INDEPENDENT_AMBULATORY_CARE_PROVIDER_SITE_OTHER): Payer: Medicare Other | Admitting: Podiatry

## 2015-07-29 DIAGNOSIS — M2142 Flat foot [pes planus] (acquired), left foot: Secondary | ICD-10-CM | POA: Diagnosis not present

## 2015-07-29 DIAGNOSIS — S92301A Fracture of unspecified metatarsal bone(s), right foot, initial encounter for closed fracture: Secondary | ICD-10-CM | POA: Diagnosis not present

## 2015-07-29 DIAGNOSIS — M19072 Primary osteoarthritis, left ankle and foot: Secondary | ICD-10-CM | POA: Diagnosis not present

## 2015-07-29 DIAGNOSIS — S92301D Fracture of unspecified metatarsal bone(s), right foot, subsequent encounter for fracture with routine healing: Secondary | ICD-10-CM

## 2015-07-29 DIAGNOSIS — M76822 Posterior tibial tendinitis, left leg: Secondary | ICD-10-CM

## 2015-07-29 DIAGNOSIS — M2141 Flat foot [pes planus] (acquired), right foot: Secondary | ICD-10-CM | POA: Diagnosis not present

## 2015-07-29 DIAGNOSIS — M25572 Pain in left ankle and joints of left foot: Secondary | ICD-10-CM

## 2015-07-29 NOTE — Progress Notes (Signed)
Patient ID: Laura Garner, female   DOB: 1941-04-04, 74 y.o.   MRN: [id number]  Subjective: 74 year old female presents the office today to pick up [place] and the left side as well as follow up evaluation of fracture to the right fifth metatarsal. She states that she has had decreased pain in the right foot inches when in regular shoe without orthotic to the pressure off the area. She states that she cannot tolerate wearing a surgical shoe or Cam Walker. She is not dizzy swelling or redness around the right foot. She also has pain along the inside aspect of her ankle on the left side as well as to the foot. No other complaints at this time in no acute changes since last appointment.  Objective: AAO 3, NAD  Neurovascular status unchanged  On the right foot there is mild tenderness to palpation along the fifth metatarsal distally. There is trace  edema over the area without any associated erythema or increase in warmth. No other areas of tenderness to the right foot.  On the left side there is decreased tenderness to palpation around the lateral aspect of the foot along the sinus tarsi. There is mild discomfort along the anterior lateral and anteromedial gutters of the ankle joint. There is a significant decrease in medial arch height. Arthritic changes are present. There is a decrease in range of motion of the subtalar joint.  No other areas of tenderness bilateral lower extremities.  No open lesions or pre-ulcerative lesions  No pain with calf compression, swelling, warmth, erythema.   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 There is callus formation around the fifth metatarsal fracture. -Brace was dispensed per Benjie Karvonen -Can continue regular shoe gear as tolerated to the right foot. There is any increase in pain to call the office. Continue  to monitor -Follow-up in 6-8  weeks or sooner if any problems are to arise. In the meantime I encouraged her to call the office with any questions, concerns, change in symptoms.   Celesta Gentile, DPM

## 2015-09-09 ENCOUNTER — Ambulatory Visit (INDEPENDENT_AMBULATORY_CARE_PROVIDER_SITE_OTHER): Payer: Medicare Other | Admitting: Podiatry

## 2015-09-09 ENCOUNTER — Encounter: Payer: Self-pay | Admitting: Podiatry

## 2015-09-09 VITALS — BP 164/68 | HR 68 | Resp 18

## 2015-09-09 DIAGNOSIS — M2142 Flat foot [pes planus] (acquired), left foot: Secondary | ICD-10-CM | POA: Diagnosis not present

## 2015-09-09 DIAGNOSIS — M19072 Primary osteoarthritis, left ankle and foot: Secondary | ICD-10-CM | POA: Diagnosis not present

## 2015-09-09 DIAGNOSIS — S92301D Fracture of unspecified metatarsal bone(s), right foot, subsequent encounter for fracture with routine healing: Secondary | ICD-10-CM | POA: Diagnosis not present

## 2015-09-09 DIAGNOSIS — M2141 Flat foot [pes planus] (acquired), right foot: Secondary | ICD-10-CM

## 2015-09-09 NOTE — Progress Notes (Signed)
Patient ID: Laura Garner, female   DOB: 1941/04/13, 75 y.o.   MRN: [id number]  Subjective: 74 year old female presents the office they for follow-up evaluation after spending [place] brace the left foot. She states that she feels significant difference in the brace feels comfortable. She gets a very mild amount of irritation the top of her foot with the brace. She wears a Band-Aid over this area which helps. She states that her right foot is doing well she has an occasional ache around the metatarsal fracture however denies any pain. Denies any swelling or redness over the area. No other complaints at this time in no acute changes.  Objective: AAO 3, NAD Neurovascular status unchanged There is no tenderness the patient on the fifth metatarsal the right foot. There is no overlying edema, erythema, increase in warmth. No other areas of tenderness the right foot. There is a significant decrease in medial arch left foot. There is no pain on the course the posterior tibial tendon/flexor tendons on the peroneal tendons. There is restriction of range of motion of the subtalar joint. Ankle joint range of motion is intact. There is a very small amount of irritation on the dorsal medial midfoot and the brace. No open lesion identified at this time. There is no overlying edema, erythema, increase in warmth left foot. No area pinpoint bony tenderness or pain the vibratory sensation bilaterally. No pain with calf compression, swelling, warmth, erythema.  Assessment: 74 year old female with left symptomatic flatfoot, arthritis; right fifth metatarsal fracture  Plan: -Treatment options discussed including all alternatives, risks, and complications -Continued [place] brace the left foot. Discussed wear a thicker sock or use moleskin over the area of irritation. If there is any wound forms to stop wearing the brace and call the office immediately. -Continue supportive shoe gear. This time the fifth metatarsal  fractures doing well. She will not immobilize and will continue the shoe. We'll off on x-rays but it will not change her treatment at this point. It was healing well last x-ray. -Follow-up in 3 months or sooner if any problems arise. In the meantime, encouraged to call the office with any questions, concerns, change in symptoms.   Celesta Gentile, DPM

## 2015-09-20 DIAGNOSIS — N189 Chronic kidney disease, unspecified: Secondary | ICD-10-CM | POA: Diagnosis not present

## 2015-09-20 DIAGNOSIS — E1149 Type 2 diabetes mellitus with other diabetic neurological complication: Secondary | ICD-10-CM | POA: Diagnosis not present

## 2015-09-27 DIAGNOSIS — E1149 Type 2 diabetes mellitus with other diabetic neurological complication: Secondary | ICD-10-CM | POA: Diagnosis not present

## 2015-09-27 DIAGNOSIS — Z9181 History of falling: Secondary | ICD-10-CM | POA: Diagnosis not present

## 2015-09-27 DIAGNOSIS — I10 Essential (primary) hypertension: Secondary | ICD-10-CM | POA: Diagnosis not present

## 2015-09-27 DIAGNOSIS — Z1389 Encounter for screening for other disorder: Secondary | ICD-10-CM | POA: Diagnosis not present

## 2015-09-27 DIAGNOSIS — R609 Edema, unspecified: Secondary | ICD-10-CM | POA: Diagnosis not present

## 2015-09-27 DIAGNOSIS — N189 Chronic kidney disease, unspecified: Secondary | ICD-10-CM | POA: Diagnosis not present

## 2015-09-27 DIAGNOSIS — Z23 Encounter for immunization: Secondary | ICD-10-CM | POA: Diagnosis not present

## 2015-10-25 DIAGNOSIS — M4306 Spondylolysis, lumbar region: Secondary | ICD-10-CM | POA: Diagnosis not present

## 2015-10-25 DIAGNOSIS — Z6837 Body mass index (BMI) 37.0-37.9, adult: Secondary | ICD-10-CM | POA: Diagnosis not present

## 2015-10-25 DIAGNOSIS — M9903 Segmental and somatic dysfunction of lumbar region: Secondary | ICD-10-CM | POA: Diagnosis not present

## 2015-10-25 DIAGNOSIS — I10 Essential (primary) hypertension: Secondary | ICD-10-CM | POA: Diagnosis not present

## 2015-10-25 DIAGNOSIS — M9905 Segmental and somatic dysfunction of pelvic region: Secondary | ICD-10-CM | POA: Diagnosis not present

## 2015-10-25 DIAGNOSIS — M4302 Spondylolysis, cervical region: Secondary | ICD-10-CM | POA: Diagnosis not present

## 2015-10-25 DIAGNOSIS — E1149 Type 2 diabetes mellitus with other diabetic neurological complication: Secondary | ICD-10-CM | POA: Diagnosis not present

## 2015-10-25 DIAGNOSIS — M5417 Radiculopathy, lumbosacral region: Secondary | ICD-10-CM | POA: Diagnosis not present

## 2015-10-25 DIAGNOSIS — R609 Edema, unspecified: Secondary | ICD-10-CM | POA: Diagnosis not present

## 2015-10-25 DIAGNOSIS — M9901 Segmental and somatic dysfunction of cervical region: Secondary | ICD-10-CM | POA: Diagnosis not present

## 2015-11-22 DIAGNOSIS — M4302 Spondylolysis, cervical region: Secondary | ICD-10-CM | POA: Diagnosis not present

## 2015-11-22 DIAGNOSIS — M5417 Radiculopathy, lumbosacral region: Secondary | ICD-10-CM | POA: Diagnosis not present

## 2015-11-22 DIAGNOSIS — M9901 Segmental and somatic dysfunction of cervical region: Secondary | ICD-10-CM | POA: Diagnosis not present

## 2015-11-22 DIAGNOSIS — M9903 Segmental and somatic dysfunction of lumbar region: Secondary | ICD-10-CM | POA: Diagnosis not present

## 2015-11-22 DIAGNOSIS — M9905 Segmental and somatic dysfunction of pelvic region: Secondary | ICD-10-CM | POA: Diagnosis not present

## 2015-11-22 DIAGNOSIS — M4306 Spondylolysis, lumbar region: Secondary | ICD-10-CM | POA: Diagnosis not present

## 2015-11-29 DIAGNOSIS — M47816 Spondylosis without myelopathy or radiculopathy, lumbar region: Secondary | ICD-10-CM | POA: Diagnosis not present

## 2015-11-29 DIAGNOSIS — M4724 Other spondylosis with radiculopathy, thoracic region: Secondary | ICD-10-CM | POA: Diagnosis not present

## 2015-11-29 DIAGNOSIS — M9902 Segmental and somatic dysfunction of thoracic region: Secondary | ICD-10-CM | POA: Diagnosis not present

## 2015-11-29 DIAGNOSIS — M9901 Segmental and somatic dysfunction of cervical region: Secondary | ICD-10-CM | POA: Diagnosis not present

## 2015-11-29 DIAGNOSIS — M9903 Segmental and somatic dysfunction of lumbar region: Secondary | ICD-10-CM | POA: Diagnosis not present

## 2015-11-29 DIAGNOSIS — M4721 Other spondylosis with radiculopathy, occipito-atlanto-axial region: Secondary | ICD-10-CM | POA: Diagnosis not present

## 2015-12-02 ENCOUNTER — Ambulatory Visit (INDEPENDENT_AMBULATORY_CARE_PROVIDER_SITE_OTHER): Payer: Medicare Other | Admitting: Podiatry

## 2015-12-02 ENCOUNTER — Encounter: Payer: Self-pay | Admitting: Podiatry

## 2015-12-02 VITALS — BP 172/74 | HR 61 | Resp 18

## 2015-12-02 DIAGNOSIS — M19072 Primary osteoarthritis, left ankle and foot: Secondary | ICD-10-CM | POA: Diagnosis not present

## 2015-12-02 DIAGNOSIS — S92301D Fracture of unspecified metatarsal bone(s), right foot, subsequent encounter for fracture with routine healing: Secondary | ICD-10-CM

## 2015-12-02 DIAGNOSIS — M25572 Pain in left ankle and joints of left foot: Secondary | ICD-10-CM

## 2015-12-04 NOTE — Progress Notes (Signed)
Patient ID: Laura Garner, female   DOB: [birth year], 74 y.o.   MRN: [id number]  Subjective: 74 year old female presents the office they for follow-up evaluation after continuing to sue the [place] brace the left foot. She says the brace is helping and she does wear a daily basis. She gets some occasional discomfort to the left foot however this is much improved since wearing the brace. She gets some occasional and comfort to the right fifth metatarsal varus at the fracture this continues to improve. No recent injury or trauma. No swelling or redness. No other concerns at this time.  Objective: AAO 3, NAD Neurovascular status unchanged There is no tenderness the patient on the fifth metatarsal the right foot. There is no overlying edema, erythema, increase in warmth. No other areas of tenderness the right foot. There is a significant decrease in medial arch left foot. There is currently no pain on the course the posterior tibial tendon/flexor tendons on the peroneal tendons. There is restriction of range of motion of the subtalar joint. Ankle joint range of motion is intact. There is a very small amount of irritation on the dorsal medial midfoot and the brace. No open lesion identified at this time. There is no overlying edema, erythema, increase in warmth left foot. No area pinpoint bony tenderness or pain the vibratory sensation bilaterally. No pain with calf compression, swelling, warmth, erythema.  Assessment: 74 year old female with left symptomatic flatfoot, arthritis; right fifth metatarsal fracture with resolving symptoms bilaterally  Plan: -Treatment options discussed including all alternatives, risks, and complications -Continued [place] brace the left foot. Also discussed shoe gear changes. -She wishes to hold off on any further x-ray on the right foot and this continues to improve. -Follow-up as needed or if any problems arise. In the meantime, encouraged to call the office with any  questions, concerns, change in symptoms.   Celesta Gentile, DPM

## 2015-12-17 DIAGNOSIS — M4212 Adult osteochondrosis of spine, cervical region: Secondary | ICD-10-CM | POA: Diagnosis not present

## 2016-01-17 DIAGNOSIS — M542 Cervicalgia: Secondary | ICD-10-CM | POA: Diagnosis not present

## 2016-01-17 DIAGNOSIS — Z6837 Body mass index (BMI) 37.0-37.9, adult: Secondary | ICD-10-CM | POA: Diagnosis not present

## 2016-01-24 DIAGNOSIS — M25511 Pain in right shoulder: Secondary | ICD-10-CM | POA: Diagnosis not present

## 2016-01-24 DIAGNOSIS — M25512 Pain in left shoulder: Secondary | ICD-10-CM | POA: Diagnosis not present

## 2016-01-24 DIAGNOSIS — M542 Cervicalgia: Secondary | ICD-10-CM | POA: Diagnosis not present

## 2016-01-26 DIAGNOSIS — M25511 Pain in right shoulder: Secondary | ICD-10-CM | POA: Diagnosis not present

## 2016-01-26 DIAGNOSIS — M25512 Pain in left shoulder: Secondary | ICD-10-CM | POA: Diagnosis not present

## 2016-01-26 DIAGNOSIS — M542 Cervicalgia: Secondary | ICD-10-CM | POA: Diagnosis not present

## 2016-01-31 DIAGNOSIS — M25511 Pain in right shoulder: Secondary | ICD-10-CM | POA: Diagnosis not present

## 2016-01-31 DIAGNOSIS — M25512 Pain in left shoulder: Secondary | ICD-10-CM | POA: Diagnosis not present

## 2016-01-31 DIAGNOSIS — M542 Cervicalgia: Secondary | ICD-10-CM | POA: Diagnosis not present

## 2016-02-03 DIAGNOSIS — M25512 Pain in left shoulder: Secondary | ICD-10-CM | POA: Diagnosis not present

## 2016-02-03 DIAGNOSIS — M542 Cervicalgia: Secondary | ICD-10-CM | POA: Diagnosis not present

## 2016-02-03 DIAGNOSIS — M25511 Pain in right shoulder: Secondary | ICD-10-CM | POA: Diagnosis not present

## 2016-02-07 DIAGNOSIS — M25512 Pain in left shoulder: Secondary | ICD-10-CM | POA: Diagnosis not present

## 2016-02-07 DIAGNOSIS — E782 Mixed hyperlipidemia: Secondary | ICD-10-CM | POA: Diagnosis not present

## 2016-02-07 DIAGNOSIS — Z79899 Other long term (current) drug therapy: Secondary | ICD-10-CM | POA: Diagnosis not present

## 2016-02-07 DIAGNOSIS — N189 Chronic kidney disease, unspecified: Secondary | ICD-10-CM | POA: Diagnosis not present

## 2016-02-07 DIAGNOSIS — E1149 Type 2 diabetes mellitus with other diabetic neurological complication: Secondary | ICD-10-CM | POA: Diagnosis not present

## 2016-02-07 DIAGNOSIS — M542 Cervicalgia: Secondary | ICD-10-CM | POA: Diagnosis not present

## 2016-02-07 DIAGNOSIS — M25511 Pain in right shoulder: Secondary | ICD-10-CM | POA: Diagnosis not present

## 2016-02-14 DIAGNOSIS — I10 Essential (primary) hypertension: Secondary | ICD-10-CM | POA: Diagnosis not present

## 2016-02-14 DIAGNOSIS — M25511 Pain in right shoulder: Secondary | ICD-10-CM | POA: Diagnosis not present

## 2016-02-14 DIAGNOSIS — Z1231 Encounter for screening mammogram for malignant neoplasm of breast: Secondary | ICD-10-CM | POA: Diagnosis not present

## 2016-02-14 DIAGNOSIS — R609 Edema, unspecified: Secondary | ICD-10-CM | POA: Diagnosis not present

## 2016-02-14 DIAGNOSIS — N189 Chronic kidney disease, unspecified: Secondary | ICD-10-CM | POA: Diagnosis not present

## 2016-02-14 DIAGNOSIS — M542 Cervicalgia: Secondary | ICD-10-CM | POA: Diagnosis not present

## 2016-02-14 DIAGNOSIS — M25512 Pain in left shoulder: Secondary | ICD-10-CM | POA: Diagnosis not present

## 2016-02-14 DIAGNOSIS — Z6837 Body mass index (BMI) 37.0-37.9, adult: Secondary | ICD-10-CM | POA: Diagnosis not present

## 2016-02-14 DIAGNOSIS — N182 Chronic kidney disease, stage 2 (mild): Secondary | ICD-10-CM | POA: Diagnosis not present

## 2016-02-14 DIAGNOSIS — E2839 Other primary ovarian failure: Secondary | ICD-10-CM | POA: Diagnosis not present

## 2016-02-14 DIAGNOSIS — E782 Mixed hyperlipidemia: Secondary | ICD-10-CM | POA: Diagnosis not present

## 2016-02-14 DIAGNOSIS — E1149 Type 2 diabetes mellitus with other diabetic neurological complication: Secondary | ICD-10-CM | POA: Diagnosis not present

## 2016-02-17 DIAGNOSIS — M542 Cervicalgia: Secondary | ICD-10-CM | POA: Diagnosis not present

## 2016-02-17 DIAGNOSIS — M25511 Pain in right shoulder: Secondary | ICD-10-CM | POA: Diagnosis not present

## 2016-02-17 DIAGNOSIS — M25512 Pain in left shoulder: Secondary | ICD-10-CM | POA: Diagnosis not present

## 2016-02-24 DIAGNOSIS — M25512 Pain in left shoulder: Secondary | ICD-10-CM | POA: Diagnosis not present

## 2016-02-24 DIAGNOSIS — M25511 Pain in right shoulder: Secondary | ICD-10-CM | POA: Diagnosis not present

## 2016-02-24 DIAGNOSIS — M542 Cervicalgia: Secondary | ICD-10-CM | POA: Diagnosis not present

## 2016-03-02 DIAGNOSIS — M25511 Pain in right shoulder: Secondary | ICD-10-CM | POA: Diagnosis not present

## 2016-03-02 DIAGNOSIS — M542 Cervicalgia: Secondary | ICD-10-CM | POA: Diagnosis not present

## 2016-03-02 DIAGNOSIS — M25512 Pain in left shoulder: Secondary | ICD-10-CM | POA: Diagnosis not present

## 2016-03-15 DIAGNOSIS — M25511 Pain in right shoulder: Secondary | ICD-10-CM | POA: Diagnosis not present

## 2016-03-15 DIAGNOSIS — M542 Cervicalgia: Secondary | ICD-10-CM | POA: Diagnosis not present

## 2016-03-15 DIAGNOSIS — M25512 Pain in left shoulder: Secondary | ICD-10-CM | POA: Diagnosis not present

## 2016-03-28 ENCOUNTER — Ambulatory Visit (INDEPENDENT_AMBULATORY_CARE_PROVIDER_SITE_OTHER): Payer: Medicare Other | Admitting: Podiatry

## 2016-03-28 ENCOUNTER — Encounter: Payer: Self-pay | Admitting: Podiatry

## 2016-03-28 ENCOUNTER — Ambulatory Visit (INDEPENDENT_AMBULATORY_CARE_PROVIDER_SITE_OTHER): Payer: Medicare Other

## 2016-03-28 DIAGNOSIS — M19079 Primary osteoarthritis, unspecified ankle and foot: Secondary | ICD-10-CM | POA: Diagnosis not present

## 2016-03-28 DIAGNOSIS — M779 Enthesopathy, unspecified: Secondary | ICD-10-CM | POA: Diagnosis not present

## 2016-03-28 DIAGNOSIS — R52 Pain, unspecified: Secondary | ICD-10-CM | POA: Diagnosis not present

## 2016-03-28 NOTE — Progress Notes (Signed)
Patient ID: Laura Garner, female   DOB: 10-13-1941, 75 y.o.   MRN: [id number]   Subjective: 74 year old female presents the office today for concerns of pain to the right ankle/foot which is been ongoing for about 3 weeks. She denies any recent injury or trauma to the area. She states it feels the same pain that she's had on the left side previously. She has would have some recurrence of pain in the outside of the left foot/ankle as well. She did well to the injection since June of last year. She is wearing the AFO and is doing well and fitting well. She's been also and await more since then. No increase in swelling or redness. Denies any systemic complaints such as fevers, chills, nausea, vomiting. No acute changes since last appointment, and no other complaints at this time.   Objective: AAO x3, NAD DP/PT pulses palpable bilaterally, CRT less than 3 seconds Protective sensation intact with Derrel Nip monofilament There is a decrease in medial arch height upon weightbearing. There is tender  on the sinus tarsi on the lateral aspect of the foot bilaterally the right side worse than left. There is no overlying erythema or increase in warmth. There is no other area pinpoint by tenderness there is no pain vibratory sensation. No areas of pinpoint bony tenderness or pain with vibratory sensation. MMT 5/5, ROM WNL.There is no pain to the fifth metatarsal head on right foot. No edema, erythema, increase in warmth to bilateral lower extremities.  No open lesions or pre-ulcerative lesions.  No pain with calf compression, swelling, warmth, erythema  Assessment: Subtalar joint arthritis, capsulitis bilaterally.  Plan: -All treatment options discussed with the patient including all alternatives, risks, complications.  -X-rays were obtained and reviewed with the patient.  -I discussed steroid injection to bilateral subtalar joints and she wishes to proceed with this understanding risks and  complications. Under sterile conditions a mixture of dexamethasone phosphate and lidocaine plain was infiltrated into the sinus tarsi through a lateral approach without complications under sterile conditions. -Continue brace on left side. -Anti-inflammatories as needed. -Follow-up in 6 weeks -Patient encouraged to call the office with any questions, concerns, change in symptoms.   Celesta Gentile, DPM

## 2016-03-30 DIAGNOSIS — N390 Urinary tract infection, site not specified: Secondary | ICD-10-CM | POA: Diagnosis not present

## 2016-03-30 DIAGNOSIS — Z6841 Body Mass Index (BMI) 40.0 and over, adult: Secondary | ICD-10-CM | POA: Diagnosis not present

## 2016-04-05 DIAGNOSIS — M25511 Pain in right shoulder: Secondary | ICD-10-CM | POA: Diagnosis not present

## 2016-04-05 DIAGNOSIS — M25512 Pain in left shoulder: Secondary | ICD-10-CM | POA: Diagnosis not present

## 2016-04-05 DIAGNOSIS — M542 Cervicalgia: Secondary | ICD-10-CM | POA: Diagnosis not present

## 2016-04-19 DIAGNOSIS — M542 Cervicalgia: Secondary | ICD-10-CM | POA: Diagnosis not present

## 2016-04-19 DIAGNOSIS — M25512 Pain in left shoulder: Secondary | ICD-10-CM | POA: Diagnosis not present

## 2016-04-19 DIAGNOSIS — M25511 Pain in right shoulder: Secondary | ICD-10-CM | POA: Diagnosis not present

## 2016-05-09 ENCOUNTER — Encounter: Payer: Self-pay | Admitting: Podiatry

## 2016-05-09 ENCOUNTER — Ambulatory Visit (INDEPENDENT_AMBULATORY_CARE_PROVIDER_SITE_OTHER): Payer: Medicare Other | Admitting: Podiatry

## 2016-05-09 VITALS — BP 179/78 | HR 74 | Resp 18

## 2016-05-09 DIAGNOSIS — M79675 Pain in left toe(s): Secondary | ICD-10-CM

## 2016-05-09 DIAGNOSIS — M79674 Pain in right toe(s): Secondary | ICD-10-CM

## 2016-05-09 DIAGNOSIS — B351 Tinea unguium: Secondary | ICD-10-CM | POA: Diagnosis not present

## 2016-05-09 DIAGNOSIS — M19079 Primary osteoarthritis, unspecified ankle and foot: Secondary | ICD-10-CM

## 2016-05-09 NOTE — Patient Instructions (Addendum)
Plantar Fasciitis (Heel Spur Syndrome) with Rehab The plantar fascia is a fibrous, ligament-like, soft-tissue structure that spans the bottom of the foot. Plantar fasciitis is a condition that causes pain in the foot due to inflammation of the tissue. SYMPTOMS   Pain and tenderness on the underneath side of the foot.  Pain that worsens with standing or walking. CAUSES  Plantar fasciitis is caused by irritation and injury to the plantar fascia on the underneath side of the foot. Common mechanisms of injury include:  Direct trauma to bottom of the foot.  Damage to a small nerve that runs under the foot where the main fascia attaches to the heel bone.  Stress placed on the plantar fascia due to bone spurs. RISK INCREASES WITH:   Activities that place stress on the plantar fascia (running, jumping, pivoting, or cutting).  Poor strength and flexibility.  Improperly fitted shoes.  Tight calf muscles.  Flat feet.  Failure to warm-up properly before activity.  Obesity. PREVENTION  Warm up and stretch properly before activity.  Allow for adequate recovery between workouts.  Maintain physical fitness:  Strength, flexibility, and endurance.  Cardiovascular fitness.  Maintain a health body weight.  Avoid stress on the plantar fascia.  Wear properly fitted shoes, including arch supports for individuals who have flat feet.  PROGNOSIS  If treated properly, then the symptoms of plantar fasciitis usually resolve without surgery. However, occasionally surgery is necessary.  RELATED COMPLICATIONS   Recurrent symptoms that may result in a chronic condition.  Problems of the lower back that are caused by compensating for the injury, such as limping.  Pain or weakness of the foot during push-off following surgery.  Chronic inflammation, scarring, and partial or complete fascia tear, occurring more often from repeated injections.  TREATMENT  Treatment initially involves the  use of ice and medication to help reduce pain and inflammation. The use of strengthening and stretching exercises may help reduce pain with activity, especially stretches of the Achilles tendon. These exercises may be performed at home or with a therapist. Your caregiver may recommend that you use heel cups of arch supports to help reduce stress on the plantar fascia. Occasionally, corticosteroid injections are given to reduce inflammation. If symptoms persist for greater than 6 months despite non-surgical (conservative), then surgery may be recommended.   MEDICATION   If pain medication is necessary, then nonsteroidal anti-inflammatory medications, such as aspirin and ibuprofen, or other minor pain relievers, such as acetaminophen, are often recommended.  Do not take pain medication within 7 days before surgery.  Prescription pain relievers may be given if deemed necessary by your caregiver. Use only as directed and only as much as you need.  Corticosteroid injections may be given by your caregiver. These injections should be reserved for the most serious cases, because they may only be given a certain number of times.  HEAT AND COLD  Cold treatment (icing) relieves pain and reduces inflammation. Cold treatment should be applied for 10 to 15 minutes every 2 to 3 hours for inflammation and pain and immediately after any activity that aggravates your symptoms. Use ice packs or massage the area with a piece of ice (ice massage).  Heat treatment may be used prior to performing the stretching and strengthening activities prescribed by your caregiver, physical therapist, or athletic trainer. Use a heat pack or soak the injury in warm water.  SEEK IMMEDIATE MEDICAL CARE IF:  Treatment seems to offer no benefit, or the condition worsens.  Any medications   produce adverse side effects.  EXERCISES- RANGE OF MOTION (ROM) AND STRETCHING EXERCISES - Plantar Fasciitis (Heel Spur Syndrome) These exercises  may help you when beginning to rehabilitate your injury. Your symptoms may resolve with or without further involvement from your physician, physical therapist or athletic trainer. While completing these exercises, remember:   Restoring tissue flexibility helps normal motion to return to the joints. This allows healthier, less painful movement and activity.  An effective stretch should be held for at least 30 seconds.  A stretch should never be painful. You should only feel a gentle lengthening or release in the stretched tissue.  RANGE OF MOTION - Toe Extension, Flexion  Sit with your right / left leg crossed over your opposite knee.  Grasp your toes and gently pull them back toward the top of your foot. You should feel a stretch on the bottom of your toes and/or foot.  Hold this stretch for 10 seconds.  Now, gently pull your toes toward the bottom of your foot. You should feel a stretch on the top of your toes and or foot.  Hold this stretch for 10 seconds. Repeat  times. Complete this stretch 3 times per day.   RANGE OF MOTION - Ankle Dorsiflexion, Active Assisted  Remove shoes and sit on a chair that is preferably not on a carpeted surface.  Place right / left foot under knee. Extend your opposite leg for support.  Keeping your heel down, slide your right / left foot back toward the chair until you feel a stretch at your ankle or calf. If you do not feel a stretch, slide your bottom forward to the edge of the chair, while still keeping your heel down.  Hold this stretch for 10 seconds. Repeat 3 times. Complete this stretch 2 times per day.   STRETCH  Gastroc, Standing  Place hands on wall.  Extend right / left leg, keeping the front knee somewhat bent.  Slightly point your toes inward on your back foot.  Keeping your right / left heel on the floor and your knee straight, shift your weight toward the wall, not allowing your back to arch.  You should feel a gentle stretch  in the right / left calf. Hold this position for 10 seconds. Repeat 3 times. Complete this stretch 2 times per day.  STRETCH  Soleus, Standing  Place hands on wall.  Extend right / left leg, keeping the other knee somewhat bent.  Slightly point your toes inward on your back foot.  Keep your right / left heel on the floor, bend your back knee, and slightly shift your weight over the back leg so that you feel a gentle stretch deep in your back calf.  Hold this position for 10 seconds. Repeat 3 times. Complete this stretch 2 times per day.  STRETCH  Gastrocsoleus, Standing  Note: This exercise can place a lot of stress on your foot and ankle. Please complete this exercise only if specifically instructed by your caregiver.   Place the ball of your right / left foot on a step, keeping your other foot firmly on the same step.  Hold on to the wall or a rail for balance.  Slowly lift your other foot, allowing your body weight to press your heel down over the edge of the step.  You should feel a stretch in your right / left calf.  Hold this position for 10 seconds.  Repeat this exercise with a slight bend in your right /   left knee. Repeat 3 times. Complete this stretch 2 times per day.   STRENGTHENING EXERCISES - Plantar Fasciitis (Heel Spur Syndrome)  These exercises may help you when beginning to rehabilitate your injury. They may resolve your symptoms with or without further involvement from your physician, physical therapist or athletic trainer. While completing these exercises, remember:   Muscles can gain both the endurance and the strength needed for everyday activities through controlled exercises.  Complete these exercises as instructed by your physician, physical therapist or athletic trainer. Progress the resistance and repetitions only as guided.  STRENGTH - Towel Curls  Sit in a chair positioned on a non-carpeted surface.  Place your foot on a towel, keeping your heel  on the floor.  Pull the towel toward your heel by only curling your toes. Keep your heel on the floor. Repeat 3 times. Complete this exercise 2 times per day.  STRENGTH - Ankle Inversion  Secure one end of a rubber exercise band/tubing to a fixed object (table, pole). Loop the other end around your foot just before your toes.  Place your fists between your knees. This will focus your strengthening at your ankle.  Slowly, pull your big toe up and in, making sure the band/tubing is positioned to resist the entire motion.  Hold this position for 10 seconds.  Have your muscles resist the band/tubing as it slowly pulls your foot back to the starting position. Repeat 3 times. Complete this exercises 2 times per day.  Document Released: 12/04/2005 Document Revised: 02/26/2012 Document Reviewed: 03/18/2009 [employer] Patient Information 2014 [employer]. Achilles Tendinitis  with Rehab Achilles tendinitis is a disorder of the Achilles tendon. The Achilles tendon connects the large calf muscles (Gastrocnemius and Soleus) to the heel bone (calcaneus). This tendon is sometimes called the heel cord. It is important for pushing-off and standing on your toes and is important for walking, running, or jumping. Tendinitis is often caused by overuse and repetitive microtrauma. SYMPTOMS  Pain, tenderness, swelling, warmth, and redness may occur over the Achilles tendon even at rest.  Pain with pushing off, or flexing or extending the ankle.  Pain that is worsened after or during activity. CAUSES   Overuse sometimes seen with rapid increase in exercise programs or in sports requiring running and jumping.  Poor physical conditioning (strength and flexibility or endurance).  Running sports, especially training running down hills.  Inadequate warm-up before practice or play or failure to stretch before participation.  Injury to the tendon. PREVENTION   Warm up and stretch before practice or  competition.  Allow time for adequate rest and recovery between practices and competition.  Keep up conditioning.  Keep up ankle and leg flexibility.  Improve or keep muscle strength and endurance.  Improve cardiovascular fitness.  Use proper technique.  Use proper equipment (shoes, skates).  To help prevent recurrence, taping, protective strapping, or an adhesive bandage may be recommended for several weeks after healing is complete. PROGNOSIS   Recovery may take weeks to several months to heal.  Longer recovery is expected if symptoms have been prolonged.  Recovery is usually quicker if the inflammation is due to a direct blow as compared with overuse or sudden strain. RELATED COMPLICATIONS   Healing time will be prolonged if the condition is not correctly treated. The injury must be given plenty of time to heal.  Symptoms can reoccur if activity is resumed too soon.  Untreated, tendinitis may increase the risk of tendon rupture requiring additional time for recovery   and possibly surgery. TREATMENT   The first treatment consists of rest anti-inflammatory medication, and ice to relieve the pain.  Stretching and strengthening exercises after resolution of pain will likely help reduce the risk of recurrence. Referral to a physical therapist or athletic trainer for further evaluation and treatment may be helpful.  A walking boot or cast may be recommended to rest the Achilles tendon. This can help break the cycle of inflammation and microtrauma.  Arch supports (orthotics) may be prescribed or recommended by your caregiver as an adjunct to therapy and rest.  Surgery to remove the inflamed tendon lining or degenerated tendon tissue is rarely necessary and has shown less than predictable results. MEDICATION   Nonsteroidal anti-inflammatory medications, such as aspirin and ibuprofen, may be used for pain and inflammation relief. Do not take within 7 days before surgery. Take  these as directed by your caregiver. Contact your caregiver immediately if any bleeding, stomach upset, or signs of allergic reaction occur. Other minor pain relievers, such as acetaminophen, may also be used.  Pain relievers may be prescribed as necessary by your caregiver. Do not take prescription pain medication for longer than 4 to 7 days. Use only as directed and only as much as you need.  Cortisone injections are rarely indicated. Cortisone injections may weaken tendons and predispose to rupture. It is better to give the condition more time to heal than to use them. HEAT AND COLD  Cold is used to relieve pain and reduce inflammation for acute and chronic Achilles tendinitis. Cold should be applied for 10 to 15 minutes every 2 to 3 hours for inflammation and pain and immediately after any activity that aggravates your symptoms. Use ice packs or an ice massage.  Heat may be used before performing stretching and strengthening activities prescribed by your caregiver. Use a heat pack or a warm soak. SEEK MEDICAL CARE IF:  Symptoms get worse or do not improve in 2 weeks despite treatment.  New, unexplained symptoms develop. Drugs used in treatment may produce side effects.  EXERCISES:  RANGE OF MOTION (ROM) AND STRETCHING EXERCISES - Achilles Tendinitis  These exercises may help you when beginning to rehabilitate your injury. Your symptoms may resolve with or without further involvement from your physician, physical therapist or athletic trainer. While completing these exercises, remember:   Restoring tissue flexibility helps normal motion to return to the joints. This allows healthier, less painful movement and activity.  An effective stretch should be held for at least 30 seconds.  A stretch should never be painful. You should only feel a gentle lengthening or release in the stretched tissue.  STRETCH  Gastroc, Standing   Place hands on wall.  Extend right / left leg, keeping the  front knee somewhat bent.  Slightly point your toes inward on your back foot.  Keeping your right / left heel on the floor and your knee straight, shift your weight toward the wall, not allowing your back to arch.  You should feel a gentle stretch in the right / left calf. Hold this position for 10 seconds. Repeat 3 times. Complete this stretch 2 times per day.  STRETCH  Soleus, Standing   Place hands on wall.  Extend right / left leg, keeping the other knee somewhat bent.  Slightly point your toes inward on your back foot.  Keep your right / left heel on the floor, bend your back knee, and slightly shift your weight over the back leg so that you feel a   gentle stretch deep in your back calf.  Hold this position for 10 seconds. Repeat 3 times. Complete this stretch 2 times per day.  STRETCH  Gastrocsoleus, Standing  Note: This exercise can place a lot of stress on your foot and ankle. Please complete this exercise only if specifically instructed by your caregiver.   Place the ball of your right / left foot on a step, keeping your other foot firmly on the same step.  Hold on to the wall or a rail for balance.  Slowly lift your other foot, allowing your body weight to press your heel down over the edge of the step.  You should feel a stretch in your right / left calf.  Hold this position for 10 seconds.  Repeat this exercise with a slight bend in your knee. Repeat 3 times. Complete this stretch 2 times per day.   STRENGTHENING EXERCISES - Achilles Tendinitis These exercises may help you when beginning to rehabilitate your injury. They may resolve your symptoms with or without further involvement from your physician, physical therapist or athletic trainer. While completing these exercises, remember:   Muscles can gain both the endurance and the strength needed for everyday activities through controlled exercises.  Complete these exercises as instructed by your physician,  physical therapist or athletic trainer. Progress the resistance and repetitions only as guided.  You may experience muscle soreness or fatigue, but the pain or discomfort you are trying to eliminate should never worsen during these exercises. If this pain does worsen, stop and make certain you are following the directions exactly. If the pain is still present after adjustments, discontinue the exercise until you can discuss the trouble with your clinician.  STRENGTH - Plantar-flexors   Sit with your right / left leg extended. Holding onto both ends of a rubber exercise band/tubing, loop it around the ball of your foot. Keep a slight tension in the band.  Slowly push your toes away from you, pointing them downward.  Hold this position for 10 seconds. Return slowly, controlling the tension in the band/tubing. Repeat 3 times. Complete this exercise 2 times per day.   STRENGTH - Plantar-flexors   Stand with your feet shoulder width apart. Steady yourself with a wall or table using as little support as needed.  Keeping your weight evenly spread over the width of your feet, rise up on your toes.*  Hold this position for 10 seconds. Repeat 3 times. Complete this exercise 2 times per day.  *If this is too easy, shift your weight toward your right / left leg until you feel challenged. Ultimately, you may be asked to do this exercise with your right / left foot only.  STRENGTH  Plantar-flexors, Eccentric  Note: This exercise can place a lot of stress on your foot and ankle. Please complete this exercise only if specifically instructed by your caregiver.   Place the balls of your feet on a step. With your hands, use only enough support from a wall or rail to keep your balance.  Keep your knees straight and rise up on your toes.  Slowly shift your weight entirely to your right / left toes and pick up your opposite foot. Gently and with controlled movement, lower your weight through your right /  left foot so that your heel drops below the level of the step. You will feel a slight stretch in the back of your calf at the end position.  Use the healthy leg to help rise up onto   the balls of both feet, then lower weight only on the right / left leg again. Build up to 15 repetitions. Then progress to 3 consecutive sets of 15 repetitions.*  After completing the above exercise, complete the same exercise with a slight knee bend (about 30 degrees). Again, build up to 15 repetitions. Then progress to 3 consecutive sets of 15 repetitions.* Perform this exercise 2 times per day.  *When you easily complete 3 sets of 15, your physician, physical therapist or athletic trainer may advise you to add resistance by wearing a backpack filled with additional weight.  STRENGTH - Plantar Flexors, Seated   Sit on a chair that allows your feet to rest flat on the ground. If necessary, sit at the edge of the chair.  Keeping your toes firmly on the ground, lift your right / left heel as far as you can without increasing any discomfort in your ankle. Repeat 3 times. Complete this exercise 2 times a day.  

## 2016-05-10 NOTE — Progress Notes (Signed)
Patient ID: Laura Garner, female   DOB: 1941/11/25, 75 y.o.   MRN: [id number]  Subjective: 75 year old female presents the office today for follow-up evaluation of bilateral foot pain. She still injection did not help much however over the last week she started to have significant improvement pain. She cries and her knee pain to her feet. She is continuing with the brace and the left side. She is wears shoes all the time which helps. No recent injury. She also says her nails are thickened elongated she cannot trim them herself and there causing pain. Denies any systemic complaints such as fevers, chills, nausea, vomiting. No acute changes since last appointment, and no other complaints at this time.   Objective: AAO x3, NAD DP/PT pulses palpable bilaterally, CRT less than 3 seconds Protective sensation intact with Derrel Nip monofilament There is a decrease in medial arch height upon weightbearing. There is currently no tenderness on the sinus tarsi on the lateral aspect of the foot bilaterally. There is no overlying erythema or increase in warmth. There is no other area pinpoint by tenderness there is no pain vibratory sensation. Subtalar joint ROM decrease but no pain with ROM. MMT 5/5, ROM WNL otherwise.There is no pain to the fifth metatarsal head on right foot. Nails are somewhat hypertrophic, dystrophic, brittle, discolored, elongated 10. There is no swelling or redness or drainage. Tenderness nails 1-5 bilaterally. No edema, erythema, increase in warmth to bilateral lower extremities.  No open lesions or pre-ulcerative lesions.  No pain with calf compression, swelling, warmth, erythema  Assessment: Subtalar joint arthritis, capsulitis bilaterally; symptomatic onychomycosis  Plan: -All treatment options discussed with the patient including all alternatives, risks, complications.  -At this time a discussed the rehabilitation exercises and stretching. Also continue supportive shoe gear.  She is inquiring about sandals. I recommended against this however around the house where stand without good arch support. This is not help and she should remain regular shoe. Continue AFO brace. -Nails debrided 10 without, occasions or bleeding. -Follow-up in 3 months or sooner if any issues are to arise. -Patient encouraged to call the office with any questions, concerns, change in symptoms.   Celesta Gentile, DPM

## 2016-06-12 DIAGNOSIS — N189 Chronic kidney disease, unspecified: Secondary | ICD-10-CM | POA: Diagnosis not present

## 2016-06-12 DIAGNOSIS — E1149 Type 2 diabetes mellitus with other diabetic neurological complication: Secondary | ICD-10-CM | POA: Diagnosis not present

## 2016-06-12 DIAGNOSIS — E782 Mixed hyperlipidemia: Secondary | ICD-10-CM | POA: Diagnosis not present

## 2016-06-19 DIAGNOSIS — E782 Mixed hyperlipidemia: Secondary | ICD-10-CM | POA: Diagnosis not present

## 2016-06-19 DIAGNOSIS — I10 Essential (primary) hypertension: Secondary | ICD-10-CM | POA: Diagnosis not present

## 2016-06-19 DIAGNOSIS — E1149 Type 2 diabetes mellitus with other diabetic neurological complication: Secondary | ICD-10-CM | POA: Diagnosis not present

## 2016-06-19 DIAGNOSIS — Z6841 Body Mass Index (BMI) 40.0 and over, adult: Secondary | ICD-10-CM | POA: Diagnosis not present

## 2016-06-19 DIAGNOSIS — R609 Edema, unspecified: Secondary | ICD-10-CM | POA: Diagnosis not present

## 2016-06-19 DIAGNOSIS — N189 Chronic kidney disease, unspecified: Secondary | ICD-10-CM | POA: Diagnosis not present

## 2016-06-19 DIAGNOSIS — N182 Chronic kidney disease, stage 2 (mild): Secondary | ICD-10-CM | POA: Diagnosis not present

## 2016-06-27 DIAGNOSIS — M9901 Segmental and somatic dysfunction of cervical region: Secondary | ICD-10-CM | POA: Diagnosis not present

## 2016-06-27 DIAGNOSIS — M5388 Other specified dorsopathies, sacral and sacrococcygeal region: Secondary | ICD-10-CM | POA: Diagnosis not present

## 2016-06-27 DIAGNOSIS — M4306 Spondylolysis, lumbar region: Secondary | ICD-10-CM | POA: Diagnosis not present

## 2016-06-27 DIAGNOSIS — M9904 Segmental and somatic dysfunction of sacral region: Secondary | ICD-10-CM | POA: Diagnosis not present

## 2016-06-27 DIAGNOSIS — M9903 Segmental and somatic dysfunction of lumbar region: Secondary | ICD-10-CM | POA: Diagnosis not present

## 2016-06-27 DIAGNOSIS — M4302 Spondylolysis, cervical region: Secondary | ICD-10-CM | POA: Diagnosis not present

## 2016-07-03 DIAGNOSIS — M9901 Segmental and somatic dysfunction of cervical region: Secondary | ICD-10-CM | POA: Diagnosis not present

## 2016-07-03 DIAGNOSIS — M9903 Segmental and somatic dysfunction of lumbar region: Secondary | ICD-10-CM | POA: Diagnosis not present

## 2016-07-03 DIAGNOSIS — M9904 Segmental and somatic dysfunction of sacral region: Secondary | ICD-10-CM | POA: Diagnosis not present

## 2016-07-03 DIAGNOSIS — M5388 Other specified dorsopathies, sacral and sacrococcygeal region: Secondary | ICD-10-CM | POA: Diagnosis not present

## 2016-07-03 DIAGNOSIS — M4302 Spondylolysis, cervical region: Secondary | ICD-10-CM | POA: Diagnosis not present

## 2016-07-03 DIAGNOSIS — M4306 Spondylolysis, lumbar region: Secondary | ICD-10-CM | POA: Diagnosis not present

## 2016-07-10 DIAGNOSIS — H04123 Dry eye syndrome of bilateral lacrimal glands: Secondary | ICD-10-CM | POA: Diagnosis not present

## 2016-07-10 DIAGNOSIS — H2513 Age-related nuclear cataract, bilateral: Secondary | ICD-10-CM | POA: Diagnosis not present

## 2016-07-10 DIAGNOSIS — H524 Presbyopia: Secondary | ICD-10-CM | POA: Diagnosis not present

## 2016-07-24 DIAGNOSIS — M5388 Other specified dorsopathies, sacral and sacrococcygeal region: Secondary | ICD-10-CM | POA: Diagnosis not present

## 2016-07-24 DIAGNOSIS — M9903 Segmental and somatic dysfunction of lumbar region: Secondary | ICD-10-CM | POA: Diagnosis not present

## 2016-07-24 DIAGNOSIS — M4302 Spondylolysis, cervical region: Secondary | ICD-10-CM | POA: Diagnosis not present

## 2016-07-24 DIAGNOSIS — M4306 Spondylolysis, lumbar region: Secondary | ICD-10-CM | POA: Diagnosis not present

## 2016-07-24 DIAGNOSIS — M9904 Segmental and somatic dysfunction of sacral region: Secondary | ICD-10-CM | POA: Diagnosis not present

## 2016-07-24 DIAGNOSIS — M9901 Segmental and somatic dysfunction of cervical region: Secondary | ICD-10-CM | POA: Diagnosis not present

## 2016-08-10 ENCOUNTER — Ambulatory Visit: Payer: Medicare Other | Admitting: Podiatry

## 2016-08-14 DIAGNOSIS — M9901 Segmental and somatic dysfunction of cervical region: Secondary | ICD-10-CM | POA: Diagnosis not present

## 2016-08-14 DIAGNOSIS — M4306 Spondylolysis, lumbar region: Secondary | ICD-10-CM | POA: Diagnosis not present

## 2016-08-14 DIAGNOSIS — M9903 Segmental and somatic dysfunction of lumbar region: Secondary | ICD-10-CM | POA: Diagnosis not present

## 2016-08-14 DIAGNOSIS — M4302 Spondylolysis, cervical region: Secondary | ICD-10-CM | POA: Diagnosis not present

## 2016-08-14 DIAGNOSIS — M9904 Segmental and somatic dysfunction of sacral region: Secondary | ICD-10-CM | POA: Diagnosis not present

## 2016-08-14 DIAGNOSIS — M5388 Other specified dorsopathies, sacral and sacrococcygeal region: Secondary | ICD-10-CM | POA: Diagnosis not present

## 2016-08-16 DIAGNOSIS — N200 Calculus of kidney: Secondary | ICD-10-CM | POA: Diagnosis not present

## 2016-08-16 DIAGNOSIS — N3080 Other cystitis without hematuria: Secondary | ICD-10-CM | POA: Diagnosis not present

## 2016-08-17 DIAGNOSIS — Z23 Encounter for immunization: Secondary | ICD-10-CM | POA: Diagnosis not present

## 2016-08-18 ENCOUNTER — Other Ambulatory Visit: Payer: Self-pay | Admitting: Urology

## 2016-08-18 DIAGNOSIS — R6889 Other general symptoms and signs: Secondary | ICD-10-CM

## 2016-08-24 ENCOUNTER — Ambulatory Visit (HOSPITAL_COMMUNITY)
Admission: RE | Admit: 2016-08-24 | Discharge: 2016-08-24 | Disposition: A | Discharge status: Home / Self Care | Payer: Medicare Other | Source: Ambulatory Visit | Attending: Urology | Admitting: Urology

## 2016-08-24 DIAGNOSIS — R6889 Other general symptoms and signs: Secondary | ICD-10-CM

## 2016-08-24 DIAGNOSIS — N83201 Unspecified ovarian cyst, right side: Secondary | ICD-10-CM | POA: Insufficient documentation

## 2016-09-12 DIAGNOSIS — R350 Frequency of micturition: Secondary | ICD-10-CM | POA: Diagnosis not present

## 2016-09-12 DIAGNOSIS — R8271 Bacteriuria: Secondary | ICD-10-CM | POA: Diagnosis not present

## 2016-09-21 ENCOUNTER — Ambulatory Visit (INDEPENDENT_AMBULATORY_CARE_PROVIDER_SITE_OTHER): Payer: Medicare Other | Admitting: Podiatry

## 2016-09-21 ENCOUNTER — Encounter: Payer: Self-pay | Admitting: Podiatry

## 2016-09-21 DIAGNOSIS — M79674 Pain in right toe(s): Secondary | ICD-10-CM

## 2016-09-21 DIAGNOSIS — B351 Tinea unguium: Secondary | ICD-10-CM

## 2016-09-21 DIAGNOSIS — M79675 Pain in left toe(s): Secondary | ICD-10-CM

## 2016-09-22 DIAGNOSIS — Z124 Encounter for screening for malignant neoplasm of cervix: Secondary | ICD-10-CM | POA: Diagnosis not present

## 2016-09-22 DIAGNOSIS — N83209 Unspecified ovarian cyst, unspecified side: Secondary | ICD-10-CM | POA: Diagnosis not present

## 2016-09-22 DIAGNOSIS — R938 Abnormal findings on diagnostic imaging of other specified body structures: Secondary | ICD-10-CM | POA: Diagnosis not present

## 2016-09-22 DIAGNOSIS — N939 Abnormal uterine and vaginal bleeding, unspecified: Secondary | ICD-10-CM | POA: Diagnosis not present

## 2016-09-22 NOTE — Progress Notes (Signed)
Patient ID: Laura Garner, female   DOB: 06-Nov-1941, 75 y.o.   MRN: [id number]  Subjective: 75 year old female presents to the office today for thickened, elongated toenails that she cannot trim them herself and there causing pain. Denies any systemic complaints such as fevers, chills, nausea, vomiting. No acute changes since last appointment, and no other complaints at this time. She states her ankles and feet are doing well otherwise. She continues with the [place].   Objective: AAO x3, NAD DP/PT pulses palpable bilaterally, CRT less than 3 seconds Protective sensation intact with Simms Weinstein monofilament Nails are hypertrophic, dystrophic, brittle, discolored, elongated 10. There is no surrounding redness or drainage. Tenderness nails 1-5 bilaterally. No edema, erythema, increase in warmth to bilateral lower extremities.  Decrease in medial arch height. There is no subcutaneous increase on the course of posterior tibial tendon, right fifth metatarsal or to other areas of the foot. No open lesions or pre-ulcerative lesions.  No pain with calf compression, swelling, warmth, erythema  Assessment: Symptomatic onychomycosis  Plan: -All treatment options discussed with the patient including all alternatives, risks, complications.  -Nails debrided 10 without complications or bleeding. -Continue [place] brace. Continue supportive shoe gear. -Follow-up in 3 months or sooner if any issues are to arise. -Patient encouraged to call the office with any questions, concerns, change in symptoms.   Celesta Gentile, DPM

## 2016-10-16 DIAGNOSIS — E1149 Type 2 diabetes mellitus with other diabetic neurological complication: Secondary | ICD-10-CM | POA: Diagnosis not present

## 2016-10-16 DIAGNOSIS — E782 Mixed hyperlipidemia: Secondary | ICD-10-CM | POA: Diagnosis not present

## 2016-10-23 DIAGNOSIS — E1149 Type 2 diabetes mellitus with other diabetic neurological complication: Secondary | ICD-10-CM | POA: Diagnosis not present

## 2016-10-23 DIAGNOSIS — I10 Essential (primary) hypertension: Secondary | ICD-10-CM | POA: Diagnosis not present

## 2016-10-23 DIAGNOSIS — K219 Gastro-esophageal reflux disease without esophagitis: Secondary | ICD-10-CM | POA: Diagnosis not present

## 2016-10-23 DIAGNOSIS — Z1389 Encounter for screening for other disorder: Secondary | ICD-10-CM | POA: Diagnosis not present

## 2016-10-23 DIAGNOSIS — E782 Mixed hyperlipidemia: Secondary | ICD-10-CM | POA: Diagnosis not present

## 2016-10-23 DIAGNOSIS — E669 Obesity, unspecified: Secondary | ICD-10-CM | POA: Diagnosis not present

## 2016-10-23 DIAGNOSIS — R197 Diarrhea, unspecified: Secondary | ICD-10-CM | POA: Diagnosis not present

## 2016-10-23 DIAGNOSIS — Z6837 Body mass index (BMI) 37.0-37.9, adult: Secondary | ICD-10-CM | POA: Diagnosis not present

## 2016-10-23 DIAGNOSIS — R609 Edema, unspecified: Secondary | ICD-10-CM | POA: Diagnosis not present

## 2016-10-23 DIAGNOSIS — Z9181 History of falling: Secondary | ICD-10-CM | POA: Diagnosis not present

## 2016-10-25 DIAGNOSIS — N83201 Unspecified ovarian cyst, right side: Secondary | ICD-10-CM | POA: Diagnosis not present

## 2016-10-25 DIAGNOSIS — D251 Intramural leiomyoma of uterus: Secondary | ICD-10-CM | POA: Diagnosis not present

## 2016-12-04 ENCOUNTER — Other Ambulatory Visit: Payer: Self-pay | Admitting: Obstetrics and Gynecology

## 2016-12-07 ENCOUNTER — Ambulatory Visit (INDEPENDENT_AMBULATORY_CARE_PROVIDER_SITE_OTHER): Payer: Medicare Other

## 2016-12-07 ENCOUNTER — Ambulatory Visit (INDEPENDENT_AMBULATORY_CARE_PROVIDER_SITE_OTHER): Payer: Medicare Other | Admitting: Podiatry

## 2016-12-07 DIAGNOSIS — M19079 Primary osteoarthritis, unspecified ankle and foot: Secondary | ICD-10-CM | POA: Diagnosis not present

## 2016-12-07 DIAGNOSIS — R52 Pain, unspecified: Secondary | ICD-10-CM | POA: Diagnosis not present

## 2016-12-07 DIAGNOSIS — M7750 Other enthesopathy of unspecified foot: Secondary | ICD-10-CM

## 2016-12-07 DIAGNOSIS — M779 Enthesopathy, unspecified: Secondary | ICD-10-CM

## 2016-12-13 NOTE — Progress Notes (Signed)
Subjective: 75 year old female presents the also concerns of bilateral foot pain which is been worsening over the last 2-3 weeks. She has increased rectum return to lose weight and do more walking and she started to get more discomfort to her feet in the same areas that she was having previously. She states the left side is worse than right. On the left side she points the outside aspect of the foot which is the majority pain on the sinus tarsi. She denies any recent injury or trauma. No swelling or redness. She describes the right foot is an overall aching sensation. Denies any systemic complaints such as fevers, chills, nausea, vomiting. No acute changes since last appointment, and no other complaints at this time.   Objective: AAO x3, NAD DP/PT pulses palpable bilaterally, CRT less than 3 seconds There is a significant decrease in medial arch height upon weightbearing. There is no specific area pinpoint bony tenderness or pain the vibratory sensation. On left foot there is tenderness in the lateral aspect of the sinus tarsi. There is decreased range of motion of subtalar joint. There is no specific area of tenderness on the right foot is subjective overall achiness. No open lesions or pre-ulcerative lesions.  No pain with calf compression, swelling, warmth, erythema  Assessment: Left foot capsulitis, symptomatic flatfoot  Plan: -All treatment options discussed with the patient including all alternatives, risks, complications.  -X-rays obtained and reviewed. No evidence of acute fracture. Old fracture of the fifth metatarsal is healed on the right foot. -Steroid injection was carefully performed today to the lateral aspect of the sinus tarsi on the left foot with a mixture dexamethasone and local anesthetic. Post injection care was discussed -Continue [place] brace. -Discussed shoe gear modifications -And inflammatory compound cream was ordered today through [employer].  -Patient encouraged to  call the office with any questions, concerns, change in symptoms.   Celesta Gentile, DPM

## 2016-12-27 ENCOUNTER — Ambulatory Visit: Payer: Medicare Other | Admitting: Sports Medicine

## 2016-12-27 DIAGNOSIS — R350 Frequency of micturition: Secondary | ICD-10-CM | POA: Diagnosis not present

## 2016-12-27 DIAGNOSIS — R8271 Bacteriuria: Secondary | ICD-10-CM | POA: Diagnosis not present

## 2016-12-27 DIAGNOSIS — R3982 Chronic bladder pain: Secondary | ICD-10-CM | POA: Diagnosis not present

## 2016-12-29 ENCOUNTER — Ambulatory Visit: Payer: Medicare Other | Admitting: Podiatry

## 2017-01-22 ENCOUNTER — Ambulatory Visit: Payer: Medicare Other | Admitting: Podiatry

## 2017-02-19 DIAGNOSIS — E1149 Type 2 diabetes mellitus with other diabetic neurological complication: Secondary | ICD-10-CM | POA: Diagnosis not present

## 2017-02-19 DIAGNOSIS — N189 Chronic kidney disease, unspecified: Secondary | ICD-10-CM | POA: Diagnosis not present

## 2017-02-19 DIAGNOSIS — E782 Mixed hyperlipidemia: Secondary | ICD-10-CM | POA: Diagnosis not present

## 2017-03-05 DIAGNOSIS — E1149 Type 2 diabetes mellitus with other diabetic neurological complication: Secondary | ICD-10-CM | POA: Diagnosis not present

## 2017-03-05 DIAGNOSIS — K219 Gastro-esophageal reflux disease without esophagitis: Secondary | ICD-10-CM | POA: Diagnosis not present

## 2017-03-05 DIAGNOSIS — I10 Essential (primary) hypertension: Secondary | ICD-10-CM | POA: Diagnosis not present

## 2017-03-05 DIAGNOSIS — E782 Mixed hyperlipidemia: Secondary | ICD-10-CM | POA: Diagnosis not present

## 2017-03-05 DIAGNOSIS — Z6839 Body mass index (BMI) 39.0-39.9, adult: Secondary | ICD-10-CM | POA: Diagnosis not present

## 2017-03-12 DIAGNOSIS — N200 Calculus of kidney: Secondary | ICD-10-CM | POA: Diagnosis not present

## 2017-03-12 DIAGNOSIS — N3020 Other chronic cystitis without hematuria: Secondary | ICD-10-CM | POA: Diagnosis not present

## 2017-03-21 ENCOUNTER — Encounter (HOSPITAL_COMMUNITY)
Admission: RE | Admit: 2017-03-21 | Discharge: 2017-03-21 | Disposition: A | Discharge status: Home / Self Care | Payer: Medicare Other | Source: Ambulatory Visit | Attending: Obstetrics and Gynecology | Admitting: Obstetrics and Gynecology

## 2017-03-21 ENCOUNTER — Other Ambulatory Visit: Payer: Self-pay

## 2017-03-21 ENCOUNTER — Encounter (HOSPITAL_COMMUNITY): Payer: Self-pay

## 2017-03-21 ENCOUNTER — Encounter (HOSPITAL_COMMUNITY): Payer: Self-pay | Admitting: Anesthesiology

## 2017-03-21 DIAGNOSIS — Z6841 Body Mass Index (BMI) 40.0 and over, adult: Secondary | ICD-10-CM | POA: Diagnosis not present

## 2017-03-21 DIAGNOSIS — K219 Gastro-esophageal reflux disease without esophagitis: Secondary | ICD-10-CM | POA: Diagnosis not present

## 2017-03-21 DIAGNOSIS — N840 Polyp of corpus uteri: Secondary | ICD-10-CM | POA: Diagnosis not present

## 2017-03-21 DIAGNOSIS — R938 Abnormal findings on diagnostic imaging of other specified body structures: Secondary | ICD-10-CM | POA: Diagnosis not present

## 2017-03-21 DIAGNOSIS — I10 Essential (primary) hypertension: Secondary | ICD-10-CM | POA: Diagnosis not present

## 2017-03-21 DIAGNOSIS — N814 Uterovaginal prolapse, unspecified: Secondary | ICD-10-CM | POA: Diagnosis not present

## 2017-03-21 DIAGNOSIS — E785 Hyperlipidemia, unspecified: Secondary | ICD-10-CM | POA: Diagnosis not present

## 2017-03-21 DIAGNOSIS — E7800 Pure hypercholesterolemia, unspecified: Secondary | ICD-10-CM | POA: Diagnosis not present

## 2017-03-21 DIAGNOSIS — E119 Type 2 diabetes mellitus without complications: Secondary | ICD-10-CM | POA: Diagnosis not present

## 2017-03-21 DIAGNOSIS — Z7984 Long term (current) use of oral hypoglycemic drugs: Secondary | ICD-10-CM | POA: Diagnosis not present

## 2017-03-21 DIAGNOSIS — Z79899 Other long term (current) drug therapy: Secondary | ICD-10-CM | POA: Diagnosis not present

## 2017-03-21 HISTORY — DX: Gastro-esophageal reflux disease without esophagitis: K21.9

## 2017-03-21 HISTORY — DX: Unspecified osteoarthritis, unspecified site: M19.90

## 2017-03-21 HISTORY — DX: Anxiety disorder, unspecified: F41.9

## 2017-03-21 HISTORY — DX: Other seasonal allergic rhinitis: J30.2

## 2017-03-21 HISTORY — DX: Personal history of urinary calculi: Z87.442

## 2017-03-21 HISTORY — DX: Hyperlipidemia, unspecified: E78.5

## 2017-03-21 HISTORY — DX: Anemia, unspecified: D64.9

## 2017-03-21 LAB — BASIC METABOLIC PANEL
ANION GAP: 5 (ref 5–15)
BUN: 18 mg/dL (ref 6–20)
CALCIUM: 9.2 mg/dL (ref 8.9–10.3)
CO2: 22 mmol/L (ref 22–32)
Chloride: 109 mmol/L (ref 101–111)
Creatinine, Ser: 0.74 mg/dL (ref 0.44–1.00)
GLUCOSE: 103 mg/dL — AB (ref 65–99)
Potassium: 4.3 mmol/L (ref 3.5–5.1)
Sodium: 136 mmol/L (ref 135–145)

## 2017-03-21 LAB — CBC
HCT: 33.8 % — ABNORMAL LOW (ref 36.0–46.0)
HEMOGLOBIN: 11.4 g/dL — AB (ref 12.0–15.0)
MCH: 32.7 pg (ref 26.0–34.0)
MCHC: 33.7 g/dL (ref 30.0–36.0)
MCV: 96.8 fL (ref 78.0–100.0)
PLATELETS: 214 10*3/uL (ref 150–400)
RBC: 3.49 MIL/uL — AB (ref 3.87–5.11)
RDW: 13.4 % (ref 11.5–15.5)
WBC: 7.1 10*3/uL (ref 4.0–10.5)

## 2017-03-21 NOTE — H&P (Signed)
NAME:  Laura Garner, Laura Garner                       ACCOUNT NO.:  MEDICAL RECORD NO.:  [id number]  LOCATION:                                 FACILITY:  PHYSICIAN:  Lucille Passy. Ulanda Edison, M.D. DATE OF BIRTH:  04/28/1941  DATE OF ADMISSION:  03/22/2017 DATE OF DISCHARGE:                             HISTORY & PHYSICAL   PRESENT ILLNESS:  A 76 year old white female, para 3-0-0-3, admitted to the hospital for a D and C, hysteroscopy, possible removal of an endometrial polyp because of the thickened endometrium.  This patient was seen in October after having been found to have a small ovarian cyst on CT scan for a kidney stone.  It showed a 2.6 cm cyst in the right ovary, but it also showed a thickened endometrium.  She was found to have an 11 mm nonobstructing stone in the right kidney.  When I saw her, I did an endometrial biopsy, that showed scant strips of inactive endometrium.  Benign squamous epithelium.  No dysplasia or malignancy. She was advised that that was not consistent with her thickened endometrium and I asked her to consider having an endometrial evaluation by D and C and hysteroscopy.  Because of time constraints with her family, this is the first time she has been able to do it.  Her CA-125 was 9.4 units/mL.  An ultrasound done in our office showed an entire right ovary measured 2.16 x 3.6 cm.  Three ovarian cysts measured 1.88 x 2.17, 1.07 x 1.16, and 0.74 x 0.72 cm.  She was noted to have 2 small fibroids and her endometrial thickness was 16.52 mm.  The endometrium was avascular, thickened, and contained numerous small cystic areas consistent with a polyp.  As stated, the patient was advised to consider surgery and now she is to have the D and C, hysteroscopy.  PAST MEDICAL HISTORY:  Her past medical history reveals that she is allergic to Avelox, iodinated contrast, and trimethoprim caused diarrhea.  MEDICATIONS:  Amlodipine, carvedilol, Cipro, losartan, metformin and Actos, and  pravastatin.  She also takes pantoprazole.  SOCIAL HISTORY:  She never smoked.  Does not drink alcohol.  Had 3 vaginal deliveries.  She does have high blood pressure, high cholesterol, type 2 diabetes, arthritis, and she is obese.  PHYSICAL EXAMINATION:  VITAL SIGNS:  On admission, weight is 214 pounds, height 5 feet 1-1/2 inches, BMI is 39.8, blood pressure 130/60, and pulse 72. HEAD, EYES, NOSE, AND THROAT:  No abnormalities.  There is an upper partial plate. NECK:  Supple, without thyromegaly. BREASTS:  Soft without masses. LUNGS:  Clear to auscultation. HEART:  Normal size and sounds.  No murmurs. ABDOMEN:  Soft, nontender and is obese, but no masses are palpable. GU:  The vulva and vagina are clean.  The cervix sits on the outside of the vagina.  The uterus is very difficult to feel.  I cannot determine if it is anterior, posterior, or midline.  The adnexa are free of masses.  ADMITTING IMPRESSION: 1. Thickened endometrium. 2. Probable endometrial polyp. 3. Uterine prolapse. 4. High blood pressure. 5. Diabetes. 6. High cholesterol. 7. Obesity. The patient is admitted  for D and C, hysteroscopy, possible removal of an endometrial polyp.  She has been counseled about the risks of surgery and is ready to proceed.     Lucille Passy. Ulanda Edison, M.D.     TFH/MEDQ  D:  03/21/2017  T:  03/21/2017  Job:  [id number]

## 2017-03-21 NOTE — Patient Instructions (Addendum)
Your procedure is scheduled on: Thursday, 4/5  Enter through the Main Entrance of [facility] at: 6 am  Pick up the phone at the desk and dial [phone].  Call this number if you have problems the morning of surgery: [phone].  Remember:  Do NOT eat or drink (including water) after midnight Wednesday 4/4.  Take these medicines the morning of surgery with a SIP OF WATER:  Carvedilol, hydralazine, claritin, protonix.  You may use flonase and eye drops if needed.  Diabetes medication:  Do not take metformin Wednesday night or Thursday morning dose.  Do not take actos on Thursday morning.  We will check your blood sugar upon arrival to Short Stay on day of surgery and treat if needed.  Do NOT wear jewelry (body piercing), metal hair clips/bobby pins, make-up, or nail polish. Do NOT wear lotions, powders, or perfumes.  You may wear deoderant. Do NOT shave for 48 hours prior to surgery. Do NOT bring valuables to the hospital. Dentures, or bridgework may not be worn into surgery.  Have a responsible adult drive you home and stay with you for 24 hours after your procedure.  Home with Daughter Marlowe Kays cell [phone]

## 2017-03-21 NOTE — Anesthesia Preprocedure Evaluation (Addendum)
Anesthesia Evaluation  Patient identified by MRN, date of birth, ID band Patient awake    Reviewed: Allergy & Precautions, NPO status , Patient's Chart, lab work & pertinent test results, reviewed documented beta blocker date and time   Airway Mallampati: II  TM Distance: >3 FB Neck ROM: Full    Dental no notable dental hx. (+) Teeth Intact, Partial Upper   Pulmonary neg pulmonary ROS,    Pulmonary exam normal breath sounds clear to auscultation       Cardiovascular hypertension, Pt. on medications and Pt. on home beta blockers Normal cardiovascular exam Rhythm:Regular Rate:Normal     Neuro/Psych Anxiety negative neurological ROS     GI/Hepatic Neg liver ROS, GERD  Medicated and Controlled,  Endo/Other  diabetes, Well Controlled, Type 2, Oral Hypoglycemic AgentsMorbid obesityHyperlipidemia   Renal/GU Hx/o renal calculi  negative genitourinary   Musculoskeletal  (+) Arthritis ,   Abdominal (+) + obese,   Peds  Hematology  (+) anemia ,   Anesthesia Other Findings   Reproductive/Obstetrics                            Anesthesia Physical Anesthesia Plan  ASA: III  Anesthesia Plan:    Post-op Pain Management:    Induction: Intravenous  Airway Management Planned: LMA  Additional Equipment:   Intra-op Plan:   Post-operative Plan: Extubation in OR  Informed Consent: I have reviewed the patients History and Physical, chart, labs and discussed the procedure including the risks, benefits and alternatives for the proposed anesthesia with the patient or authorized representative who has indicated his/her understanding and acceptance.   Dental advisory given  Plan Discussed with: CRNA, Anesthesiologist and Surgeon  Anesthesia Plan Comments:        Anesthesia Quick Evaluation

## 2017-03-22 ENCOUNTER — Encounter (HOSPITAL_COMMUNITY)
Admission: RE | Disposition: A | Discharge status: Home / Self Care | Payer: Self-pay | Source: Ambulatory Visit | Attending: Obstetrics and Gynecology

## 2017-03-22 ENCOUNTER — Encounter (HOSPITAL_COMMUNITY): Payer: Self-pay

## 2017-03-22 ENCOUNTER — Ambulatory Visit (HOSPITAL_COMMUNITY): Payer: Medicare Other | Admitting: Anesthesiology

## 2017-03-22 ENCOUNTER — Ambulatory Visit (HOSPITAL_COMMUNITY)
Admission: RE | Admit: 2017-03-22 | Discharge: 2017-03-22 | Disposition: A | Discharge status: Home / Self Care | Payer: Medicare Other | Source: Ambulatory Visit | Attending: Obstetrics and Gynecology | Admitting: Obstetrics and Gynecology

## 2017-03-22 DIAGNOSIS — E119 Type 2 diabetes mellitus without complications: Secondary | ICD-10-CM | POA: Diagnosis not present

## 2017-03-22 DIAGNOSIS — E7800 Pure hypercholesterolemia, unspecified: Secondary | ICD-10-CM | POA: Insufficient documentation

## 2017-03-22 DIAGNOSIS — Z7984 Long term (current) use of oral hypoglycemic drugs: Secondary | ICD-10-CM | POA: Diagnosis not present

## 2017-03-22 DIAGNOSIS — Z6841 Body Mass Index (BMI) 40.0 and over, adult: Secondary | ICD-10-CM | POA: Insufficient documentation

## 2017-03-22 DIAGNOSIS — Z79899 Other long term (current) drug therapy: Secondary | ICD-10-CM | POA: Insufficient documentation

## 2017-03-22 DIAGNOSIS — K219 Gastro-esophageal reflux disease without esophagitis: Secondary | ICD-10-CM | POA: Diagnosis not present

## 2017-03-22 DIAGNOSIS — E785 Hyperlipidemia, unspecified: Secondary | ICD-10-CM | POA: Diagnosis not present

## 2017-03-22 DIAGNOSIS — I10 Essential (primary) hypertension: Secondary | ICD-10-CM | POA: Insufficient documentation

## 2017-03-22 DIAGNOSIS — N840 Polyp of corpus uteri: Secondary | ICD-10-CM | POA: Diagnosis not present

## 2017-03-22 DIAGNOSIS — N814 Uterovaginal prolapse, unspecified: Secondary | ICD-10-CM | POA: Insufficient documentation

## 2017-03-22 DIAGNOSIS — R938 Abnormal findings on diagnostic imaging of other specified body structures: Secondary | ICD-10-CM | POA: Insufficient documentation

## 2017-03-22 HISTORY — PX: HYSTEROSCOPY W/D&C: SHX1775

## 2017-03-22 LAB — GLUCOSE, CAPILLARY
GLUCOSE-CAPILLARY: 130 mg/dL — AB (ref 65–99)
Glucose-Capillary: 128 mg/dL — ABNORMAL HIGH (ref 65–99)

## 2017-03-22 SURGERY — DILATATION AND CURETTAGE /HYSTEROSCOPY
Anesthesia: General

## 2017-03-22 MED ORDER — SILVER NITRATE-POT NITRATE 75-25 % EX MISC
CUTANEOUS | Status: AC
  Filled 2017-03-22: qty 1

## 2017-03-22 MED ORDER — MEPERIDINE HCL 25 MG/ML IJ SOLN: 6.2500 mg | INTRAMUSCULAR | Status: DC | PRN

## 2017-03-22 MED ORDER — ONDANSETRON HCL 4 MG/2ML IJ SOLN
INTRAMUSCULAR | Status: AC
  Filled 2017-03-22: qty 2

## 2017-03-22 MED ORDER — PROPOFOL 10 MG/ML IV BOLUS
INTRAVENOUS | Status: DC | PRN
  Administered 2017-03-22: 20 mg via INTRAVENOUS
  Administered 2017-03-22: 50 mg via INTRAVENOUS
  Administered 2017-03-22: 130 mg via INTRAVENOUS

## 2017-03-22 MED ORDER — ONDANSETRON HCL 4 MG/2ML IJ SOLN
INTRAMUSCULAR | Status: DC | PRN
  Administered 2017-03-22: 4 mg via INTRAVENOUS

## 2017-03-22 MED ORDER — LACTATED RINGERS IV SOLN
INTRAVENOUS | Status: DC
  Administered 2017-03-22: 07:00:00 via INTRAVENOUS

## 2017-03-22 MED ORDER — FENTANYL CITRATE (PF) 100 MCG/2ML IJ SOLN
25.0000 ug | INTRAMUSCULAR | Status: DC | PRN
  Administered 2017-03-22: 25 ug via INTRAVENOUS

## 2017-03-22 MED ORDER — EPHEDRINE 5 MG/ML INJ
INTRAVENOUS | Status: AC
  Filled 2017-03-22: qty 10

## 2017-03-22 MED ORDER — METOCLOPRAMIDE HCL 5 MG/ML IJ SOLN: 10.0000 mg | Freq: Once | INTRAMUSCULAR | Status: DC | PRN

## 2017-03-22 MED ORDER — LIDOCAINE HCL (CARDIAC) 20 MG/ML IV SOLN
INTRAVENOUS | Status: AC
  Filled 2017-03-22: qty 5

## 2017-03-22 MED ORDER — FENTANYL CITRATE (PF) 100 MCG/2ML IJ SOLN
INTRAMUSCULAR | Status: AC
  Administered 2017-03-22: 25 ug via INTRAVENOUS
  Filled 2017-03-22: qty 2

## 2017-03-22 MED ORDER — FENTANYL CITRATE (PF) 100 MCG/2ML IJ SOLN
INTRAMUSCULAR | Status: AC
  Filled 2017-03-22: qty 2

## 2017-03-22 MED ORDER — PROPOFOL 10 MG/ML IV BOLUS
INTRAVENOUS | Status: AC
  Filled 2017-03-22: qty 20

## 2017-03-22 MED ORDER — FENTANYL CITRATE (PF) 100 MCG/2ML IJ SOLN
INTRAMUSCULAR | Status: DC | PRN
  Administered 2017-03-22 (×2): 50 ug via INTRAVENOUS

## 2017-03-22 MED ORDER — LIDOCAINE HCL (CARDIAC) 20 MG/ML IV SOLN
INTRAVENOUS | Status: DC | PRN
  Administered 2017-03-22: 80 mg via INTRAVENOUS

## 2017-03-22 MED ORDER — SODIUM CHLORIDE 0.9 % IR SOLN
Status: DC | PRN
  Administered 2017-03-22: 3000 mL

## 2017-03-22 MED ORDER — EPHEDRINE SULFATE 50 MG/ML IJ SOLN
INTRAMUSCULAR | Status: DC | PRN
  Administered 2017-03-22: 5 mg via INTRAVENOUS
  Administered 2017-03-22: 10 mg via INTRAVENOUS

## 2017-03-22 SURGICAL SUPPLY — 17 items
CANISTER SUCT 3000ML PPV (MISCELLANEOUS) ×2 IMPLANT
CATH ROBINSON RED A/P 16FR (CATHETERS) ×2 IMPLANT
CLOTH BEACON ORANGE TIMEOUT ST (SAFETY) ×2 IMPLANT
CONTAINER PREFILL 10% NBF 60ML (FORM) ×4 IMPLANT
ELECT REM PT RETURN 9FT ADLT (ELECTROSURGICAL) ×2
ELECTRODE REM PT RTRN 9FT ADLT (ELECTROSURGICAL) ×1 IMPLANT
GLOVE BIO SURGEON STRL SZ7 (GLOVE) ×4 IMPLANT
GLOVE BIO SURGEON STRL SZ7.5 (GLOVE) ×2 IMPLANT
GLOVE BIOGEL PI IND STRL 7.0 (GLOVE) ×1 IMPLANT
GLOVE BIOGEL PI INDICATOR 7.0 (GLOVE) ×1
GLOVE INDICATOR 7.0 STRL GRN (GLOVE) ×6 IMPLANT
GOWN STRL REUS W/TWL LRG LVL3 (GOWN DISPOSABLE) ×4 IMPLANT
PACK VAGINAL MINOR WOMEN LF (CUSTOM PROCEDURE TRAY) ×2 IMPLANT
PAD OB MATERNITY 4.3X12.25 (PERSONAL CARE ITEMS) ×2 IMPLANT
TOWEL OR 17X24 6PK STRL BLUE (TOWEL DISPOSABLE) ×4 IMPLANT
TUBING AQUILEX INFLOW (TUBING) ×2 IMPLANT
TUBING AQUILEX OUTFLOW (TUBING) ×2 IMPLANT

## 2017-03-22 NOTE — Op Note (Signed)
NAME:  Laura Garner, Laura Garner                       ACCOUNT NO.:  MEDICAL RECORD NO.:  [id number]  LOCATION:                                 FACILITY:  PHYSICIAN:  Lucille Passy. Ulanda Edison, M.D. DATE OF BIRTH:  [birth year]  DATE OF PROCEDURE:  03/22/2017 DATE OF DISCHARGE:                              OPERATIVE REPORT   PREOPERATIVE DIAGNOSIS:  Probable endometrial polyp.  POSTOPERATIVE DIAGNOSIS:  Probable endometrial polyp.  OPERATION:  D and C, hysteroscopy, and removal of endometrial polyp.  SURGEON:  Newton Pigg, M.D.  ANESTHESIA:  General anesthesia.  DESCRIPTION OF PROCEDURE:  The patient was brought to the operating room and placed under satisfactory general anesthesia, and oral airway was given, initially the one used #4 was too large, she was given #3 and it fit well.  She was placed in lithotomy position.  The vagina and vulva were prepped with Hibiclens by the nurse.  She was not catheterized because she is currently having UTI.  The area was draped as a sterile field.  A weighted speculum was placed posteriorly and Sims retractor anteriorly.  The cervix was grasped and cervix presented at the introitus.  It was grasped with a tenaculum on the anterior cervical lip.  Uterus sounded to 11 cm slightly posteriorly.  The cervix was dilated progressively so that the hysteroscope could be introduced. While introducing the hysteroscope, I noticed the endometrial polyp coming down close to the cervix.  I tried to go above the polyp and inspected the endometrial cavity, but of course the cavity was pretty much obliterated by the polyp.  I took a picture of the polyp, grasped with different instruments.  The uterine dressing forceps, the baby ring forceps and the polyp forceps, all of which would grasp the polyp and remove small chunks of the polyp, but not the entire polyp, so I looked again twice with the hysteroscope to confirm that we had not removed the polyp in its entirety.  I then  was able to discern that it was originating from the left uterine wall.  I used the myoma graspers and with 2 entries into the endometrial cavity, I was able to remove the entire polyp.  I then re-looked with the hysteroscope, saw there was no bleeding, the cavity was completely clear.  I did an endocervical and endometrial curettages, but very little if any tissue was obtained from both.  Procedure was terminated.  Fluid deficit was 245 mL, but there was much on the floor.  The patient seemed to tolerate the procedure well.  She will be returned to recovery and will be assessed there.  ESTIMATED BLOOD LOSS:  Less than 5 mL.     Lucille Passy. Ulanda Edison, M.D.     TFH/MEDQ  D:  03/22/2017  T:  03/22/2017  Job:  [id number]

## 2017-03-22 NOTE — Op Note (Signed)
Pre and post op dx:  Endometrial polyp  Op: D&C Hysteroscopy and removal of endometrial polyp  Op: Paw Karstens  General anesthesia   EBL < 5 cc's.   To RR room after awake.

## 2017-03-22 NOTE — Transfer of Care (Signed)
Immediate Anesthesia Transfer of Care Note  Patient: Laura Garner  Procedure(s) Performed: Procedure(s): DILATATION AND CURETTAGE /HYSTEROSCOPY (N/A)  Patient Location: PACU  Anesthesia Type:General  Level of Consciousness: awake, alert  and oriented  Airway & Oxygen Therapy: Patient Spontanous Breathing and Patient connected to nasal cannula oxygen  Post-op Assessment: Report given to RN and Post -op Vital signs reviewed and stable  Post vital signs: Reviewed and stable  Last Vitals:  Vitals:   03/22/17 0608  BP: (!) 164/73  Pulse: 67  Resp: 16  Temp: 36.4 C    Last Pain:  Vitals:   03/22/17 0608  TempSrc: Oral      Patients Stated Pain Goal: 3 (49/75/30 0511)  Complications: No apparent anesthesia complications

## 2017-03-22 NOTE — Discharge Planning (Signed)
Motrin 600 mg # 20 1 po q6h prn pain Call with temp> 100.4 degrees, heavy bleeding ,excessive pain or any problems  Return to my office 2 weeks.

## 2017-03-22 NOTE — Anesthesia Procedure Notes (Signed)
Procedure Name: LMA Insertion Date/Time: 03/22/2017 8:33 AM Performed by: Sydni Elizarraraz, Sheron Nightingale Pre-anesthesia Checklist: Patient identified, Emergency Drugs available, Suction available, Patient being monitored and Timeout performed Patient Re-evaluated:Patient Re-evaluated prior to inductionOxygen Delivery Method: Circle system utilized Preoxygenation: Pre-oxygenation with 100% oxygen Intubation Type: IV induction LMA: LMA inserted LMA Size: 3.0 Number of attempts: 2 Placement Confirmation: breath sounds checked- equal and bilateral ETT to lip (cm): at lipd. Dental Injury: Teeth and Oropharynx as per pre-operative assessment

## 2017-03-22 NOTE — Anesthesia Postprocedure Evaluation (Signed)
Anesthesia Post Note  Patient: Syerra D Dunker  Procedure(s) Performed: Procedure(s) (LRB): DILATATION AND CURETTAGE /HYSTEROSCOPY (N/A)  Patient location during evaluation: PACU Anesthesia Type: General Level of consciousness: awake and alert and oriented Pain management: pain level controlled Vital Signs Assessment: post-procedure vital signs reviewed and stable Respiratory status: spontaneous breathing, nonlabored ventilation and respiratory function stable Cardiovascular status: blood pressure returned to baseline and stable Postop Assessment: no signs of nausea or vomiting Anesthetic complications: no        Last Vitals:  Vitals:   03/22/17 0815 03/22/17 0845  BP:  (!) 173/56  Pulse:  66  Resp: 16 14  Temp: 36.6 C     Last Pain:  Vitals:   03/22/17 0845  TempSrc:   PainSc: 3    Pain Goal: Patients Stated Pain Goal: 3 (03/22/17 8403)               Gilma Bessette A.

## 2017-03-22 NOTE — Progress Notes (Signed)
I examined this lady 03-21-17 and she reports no change in her health since that time.

## 2017-03-22 NOTE — Discharge Instructions (Signed)
DISCHARGE INSTRUCTIONS: HYSTEROSCOPY / ENDOMETRIAL ABLATION The following instructio  Personal hygiene:  Use sanitary pads for vaginal drainage, not tampons.  Shower the day after your procedure.  NO tub baths, pools or Jacuzzis for 2-3 weeks.  Wipe front to back after using the bathroom.  Activity and limitations:  Do NOT drive or operate any equipment for 24 hours. The effects of anesthesia are still present and drowsiness may result.  Do NOT rest in bed all day.  Walking is encouraged.  Walk up and down stairs slowly.  You may resume your normal activity in one to two days or as indicated by your physician. Sexual activity: NO intercourse for at least 2 weeks after the procedure, or as indicated by your Doctor.  Diet: Eat a light meal as desired this evening. You may resume your usual diet tomorrow.  Return to Work: You may resume your work activities in one to two days or as indicated by Marine scientist.  What to expect after your surgery: Expect to have vaginal bleeding/discharge for 2-3 days and spotting for up to 10 days. It is not unusual to have soreness for up to 1-2 weeks. You may have a slight burning sensation when you urinate for the first day. Mild cramps may continue for a couple of days. You may have a regular period in 2-6 weeks.  Call your doctor for any of the following:  Excessive vaginal bleeding or clotting, saturating and changing one pad every hour.  Inability to urinate 6 hours after discharge from hospital.  Pain not relieved by pain medication.  Fever of 100.4 F or greater.  Unusual vaginal discharge or odor.  Return to office _________________Call for an appointment ___________________ Patients signature: ______________________ Nurses signature ________________________  Post Anesthesia Care Unit [phone]    Post Anesthesia Home Care Instructions  Activity: Get plenty of rest for the remainder of the day. A responsible  individual must stay with you for 24 hours following the procedure.  For the next 24 hours, DO NOT: -Drive a car -Paediatric nurse -Drink alcoholic beverages -Take any medication unless instructed by your physician -Make any legal decisions or sign important papers.  Meals: Start with liquid foods such as gelatin or soup. Progress to regular foods as tolerated. Avoid greasy, spicy, heavy foods. If nausea and/or vomiting occur, drink only clear liquids until the nausea and/or vomiting subsides. Call your physician if vomiting continues.  Special Instructions/Symptoms: Your throat may feel dry or sore from the anesthesia or the breathing tube placed in your throat during surgery. If this causes discomfort, gargle with warm salt water. The discomfort should disappear within 24 hours.  If you had a scopolamine patch placed behind your ear for the management of post- operative nausea and/or vomiting:  1. The medication in the patch is effective for 72 hours, after which it should be removed.  Wrap patch in a tissue and discard in the trash. Wash hands thoroughly with soap and water. 2. You may remove the patch earlier than 72 hours if you experience unpleasant side effects which may include dry mouth, dizziness or visual disturbances. 3. Avoid touching the patch. Wash your hands with soap and water after contact with the patch.

## 2017-03-22 NOTE — Anesthesia Procedure Notes (Signed)
Performed by: Chanel Mckesson M       

## 2017-03-23 ENCOUNTER — Encounter (HOSPITAL_COMMUNITY): Payer: Self-pay | Admitting: Obstetrics and Gynecology

## 2017-06-18 DIAGNOSIS — R350 Frequency of micturition: Secondary | ICD-10-CM | POA: Diagnosis not present

## 2017-06-18 DIAGNOSIS — N3020 Other chronic cystitis without hematuria: Secondary | ICD-10-CM | POA: Diagnosis not present

## 2017-06-18 DIAGNOSIS — N200 Calculus of kidney: Secondary | ICD-10-CM | POA: Diagnosis not present

## 2017-07-05 DIAGNOSIS — E782 Mixed hyperlipidemia: Secondary | ICD-10-CM | POA: Diagnosis not present

## 2017-07-05 DIAGNOSIS — E1149 Type 2 diabetes mellitus with other diabetic neurological complication: Secondary | ICD-10-CM | POA: Diagnosis not present

## 2017-07-05 DIAGNOSIS — I10 Essential (primary) hypertension: Secondary | ICD-10-CM | POA: Diagnosis not present

## 2017-07-09 DIAGNOSIS — H25812 Combined forms of age-related cataract, left eye: Secondary | ICD-10-CM | POA: Diagnosis not present

## 2017-07-09 DIAGNOSIS — E119 Type 2 diabetes mellitus without complications: Secondary | ICD-10-CM | POA: Diagnosis not present

## 2017-07-09 DIAGNOSIS — H25013 Cortical age-related cataract, bilateral: Secondary | ICD-10-CM | POA: Diagnosis not present

## 2017-07-10 DIAGNOSIS — I10 Essential (primary) hypertension: Secondary | ICD-10-CM | POA: Diagnosis not present

## 2017-07-10 DIAGNOSIS — E782 Mixed hyperlipidemia: Secondary | ICD-10-CM | POA: Diagnosis not present

## 2017-07-10 DIAGNOSIS — K219 Gastro-esophageal reflux disease without esophagitis: Secondary | ICD-10-CM | POA: Diagnosis not present

## 2017-07-10 DIAGNOSIS — Z6841 Body Mass Index (BMI) 40.0 and over, adult: Secondary | ICD-10-CM | POA: Diagnosis not present

## 2017-07-10 DIAGNOSIS — E1149 Type 2 diabetes mellitus with other diabetic neurological complication: Secondary | ICD-10-CM | POA: Diagnosis not present

## 2017-07-12 ENCOUNTER — Encounter: Payer: Self-pay | Admitting: Podiatry

## 2017-07-12 ENCOUNTER — Ambulatory Visit (INDEPENDENT_AMBULATORY_CARE_PROVIDER_SITE_OTHER): Payer: Medicare Other | Admitting: Podiatry

## 2017-07-12 DIAGNOSIS — M79675 Pain in left toe(s): Secondary | ICD-10-CM

## 2017-07-12 DIAGNOSIS — M79674 Pain in right toe(s): Secondary | ICD-10-CM | POA: Diagnosis not present

## 2017-07-12 DIAGNOSIS — M779 Enthesopathy, unspecified: Secondary | ICD-10-CM | POA: Diagnosis not present

## 2017-07-12 DIAGNOSIS — B351 Tinea unguium: Secondary | ICD-10-CM | POA: Diagnosis not present

## 2017-07-12 DIAGNOSIS — M19072 Primary osteoarthritis, left ankle and foot: Secondary | ICD-10-CM

## 2017-07-12 NOTE — Progress Notes (Signed)
Subjective: [name] presents the office they for concerns of pain to the office aspect of the first feet around the ankle. She points to the sinus tarsi both feet where she gets discomfort. She also gets some occasional pain about his left heel that when he first gets up after she walks and gets the brace on this pain resolves. She is requesting steroid injections in both of her feet today. She also states her nails are thick, discolored, elongated causing irritation in shoes and she is asking for them to be trimmed today. Denies any redness or drainage or any swelling to the toenail sites. Denies any systemic complaints such as fevers, chills, nausea, vomiting. No acute changes since last appointment, and no other complaints at this time.   Objective: AAO x3, NAD DP/PT pulses palpable bilaterally, CRT less than 3 seconds There is tenderness bilateral feet on the lateral aspect of the foot on the sinus tarsi. Decreased range of motion of the sinus tarsi bilaterally. There is no pain of the ankle. This no area pinpoint bony tenderness. Vibratory sensation. Is no pain along the course or insertion the plantar fascia today. Nails are hypertrophic, dystrophic, brittle, discolored, elongated 10. No surrounding redness or drainage. Tenderness nails 1-5 bilaterally. No open lesions or pre-ulcerative lesions are identified today.  No pain with calf compression, swelling, warmth, erythema  Assessment: 76 year old female with bilateral sinus tarsi capsulitis; symptomatic onychomycosis  Plan: -All treatment options discussed with the patient including all alternatives, risks, complications.  -Today she is requesting steroid injections. Mature Kenalog and local anesthetic was infiltrated bilaterally. Maximal tenderness on the sinus tarsi. Post injection care was discussed. -She does need to shoes. Coupons for [employer] provided. Continue with brace.  -Nail sharply debrided 10 without complications  or bleeding -Daily foot inspection -RTC 3 months or sooner if needed. Call any questions or concerns. -Patient encouraged to call the office with any questions, concerns, change in symptoms.   Celesta Gentile, DPM

## 2017-08-23 DIAGNOSIS — Z6841 Body Mass Index (BMI) 40.0 and over, adult: Secondary | ICD-10-CM | POA: Diagnosis not present

## 2017-08-23 DIAGNOSIS — Z23 Encounter for immunization: Secondary | ICD-10-CM | POA: Diagnosis not present

## 2017-08-23 DIAGNOSIS — I10 Essential (primary) hypertension: Secondary | ICD-10-CM | POA: Diagnosis not present

## 2017-09-06 DIAGNOSIS — Z6741 Type O blood, Rh negative: Secondary | ICD-10-CM | POA: Diagnosis not present

## 2017-09-06 DIAGNOSIS — Z9181 History of falling: Secondary | ICD-10-CM | POA: Diagnosis not present

## 2017-09-06 DIAGNOSIS — Z1211 Encounter for screening for malignant neoplasm of colon: Secondary | ICD-10-CM | POA: Diagnosis not present

## 2017-09-06 DIAGNOSIS — I10 Essential (primary) hypertension: Secondary | ICD-10-CM | POA: Diagnosis not present

## 2017-09-06 DIAGNOSIS — Z1212 Encounter for screening for malignant neoplasm of rectum: Secondary | ICD-10-CM | POA: Diagnosis not present

## 2017-09-06 DIAGNOSIS — Z1389 Encounter for screening for other disorder: Secondary | ICD-10-CM | POA: Diagnosis not present

## 2017-09-06 DIAGNOSIS — E669 Obesity, unspecified: Secondary | ICD-10-CM | POA: Diagnosis not present

## 2017-09-06 DIAGNOSIS — Z0000 Encounter for general adult medical examination without abnormal findings: Secondary | ICD-10-CM | POA: Diagnosis not present

## 2017-09-06 DIAGNOSIS — Z136 Encounter for screening for cardiovascular disorders: Secondary | ICD-10-CM | POA: Diagnosis not present

## 2017-09-06 DIAGNOSIS — E785 Hyperlipidemia, unspecified: Secondary | ICD-10-CM | POA: Diagnosis not present

## 2017-09-06 DIAGNOSIS — Z6841 Body Mass Index (BMI) 40.0 and over, adult: Secondary | ICD-10-CM | POA: Diagnosis not present

## 2017-09-24 DIAGNOSIS — E1149 Type 2 diabetes mellitus with other diabetic neurological complication: Secondary | ICD-10-CM | POA: Diagnosis not present

## 2017-09-24 DIAGNOSIS — I10 Essential (primary) hypertension: Secondary | ICD-10-CM | POA: Diagnosis not present

## 2017-09-24 DIAGNOSIS — Z6841 Body Mass Index (BMI) 40.0 and over, adult: Secondary | ICD-10-CM | POA: Diagnosis not present

## 2017-10-01 DIAGNOSIS — Z1212 Encounter for screening for malignant neoplasm of rectum: Secondary | ICD-10-CM | POA: Diagnosis not present

## 2017-10-01 DIAGNOSIS — Z1211 Encounter for screening for malignant neoplasm of colon: Secondary | ICD-10-CM | POA: Diagnosis not present

## 2017-10-11 ENCOUNTER — Encounter: Payer: Self-pay | Admitting: Podiatry

## 2017-10-11 ENCOUNTER — Ambulatory Visit (INDEPENDENT_AMBULATORY_CARE_PROVIDER_SITE_OTHER): Payer: Medicare Other | Admitting: Podiatry

## 2017-10-11 DIAGNOSIS — B351 Tinea unguium: Secondary | ICD-10-CM | POA: Diagnosis not present

## 2017-10-11 DIAGNOSIS — M7750 Other enthesopathy of unspecified foot: Secondary | ICD-10-CM

## 2017-10-11 DIAGNOSIS — M79674 Pain in right toe(s): Secondary | ICD-10-CM | POA: Diagnosis not present

## 2017-10-11 DIAGNOSIS — M79675 Pain in left toe(s): Secondary | ICD-10-CM

## 2017-10-11 DIAGNOSIS — M779 Enthesopathy, unspecified: Secondary | ICD-10-CM

## 2017-10-11 NOTE — Progress Notes (Signed)
Subjective: 76 y.o. returns the office today for painful, elongated, thickened toenails which she cannot trim herself. Denies any redness or drainage around the nails. She has a states that the injection she had last appointment helped quite a bit. They lasted for several weeks but the pain started to come back. She denies any recent injury or trauma. She states that was the best relief she's had injections we did both sides of the same time that she's had previously when doing one side individually. Denies any acute changes since last appointment and no new complaints today. Denies any systemic complaints such as fevers, chills, nausea, vomiting.   PCP: Philmore Pali, NP  Objective: AAO 3, NAD DP/PT pulses palpable, CRT less than 3 seconds Nails hypertrophic, dystrophic, elongated, brittle, discolored 10. There is tenderness overlying the nails 1-5 bilaterally. There is no surrounding erythema or drainage along the nail sites. No open lesions or pre-ulcerative lesions are identified. There is tenderness along the lateral aspect of bilateral feet on the sinus tarsi. There is no other area tenderness identified bilaterally. This chronic bilateral lower extremity edema but she states this has gotten better since changing her medications. There is no erythema or increase in warmth bilaterally. Varicose veins are present. No other areas of tenderness bilateral lower extremities. No overlying edema, erythema, increased warmth. No pain with calf compression, swelling, warmth, erythema.  Assessment: Patient presents with symptomatic onychomycosis; capsulitis  Plan: -Treatment options including alternatives, risks, complications were discussed -Nails sharply debrided 10 without complication/bleeding. -Discussed steroid injections today into the sinus tarsi. Understanding risks and complications she wants to go ahead and proceed with this. Under Betadine preparation a mixture of Kenalog 10 and local  anesthetic was infiltrated into the lateral aspect of the foot on the sinus tarsi without complications. Post injection care was discussed. -Discussed daily foot inspection. If there are any changes, to call the office immediately.  -Follow-up in 3 months or sooner if any problems are to arise. In the meantime, encouraged to call the office with any questions, concerns, changes symptoms.  Celesta Gentile, DPM

## 2017-10-23 ENCOUNTER — Ambulatory Visit: Payer: Medicare Other | Admitting: Internal Medicine

## 2017-10-23 DIAGNOSIS — B372 Candidiasis of skin and nail: Secondary | ICD-10-CM | POA: Diagnosis not present

## 2017-10-23 DIAGNOSIS — I10 Essential (primary) hypertension: Secondary | ICD-10-CM | POA: Diagnosis not present

## 2017-10-23 DIAGNOSIS — E1149 Type 2 diabetes mellitus with other diabetic neurological complication: Secondary | ICD-10-CM | POA: Diagnosis not present

## 2017-10-24 ENCOUNTER — Ambulatory Visit (INDEPENDENT_AMBULATORY_CARE_PROVIDER_SITE_OTHER): Payer: Medicare Other | Admitting: Internal Medicine

## 2017-10-24 ENCOUNTER — Encounter: Payer: Self-pay | Admitting: Internal Medicine

## 2017-10-24 VITALS — BP 182/80 | HR 62 | Ht 61.5 in | Wt 215.8 lb

## 2017-10-24 DIAGNOSIS — R002 Palpitations: Secondary | ICD-10-CM | POA: Diagnosis not present

## 2017-10-24 DIAGNOSIS — I10 Essential (primary) hypertension: Secondary | ICD-10-CM

## 2017-10-24 DIAGNOSIS — R0789 Other chest pain: Secondary | ICD-10-CM | POA: Insufficient documentation

## 2017-10-24 DIAGNOSIS — R0602 Shortness of breath: Secondary | ICD-10-CM | POA: Diagnosis not present

## 2017-10-24 DIAGNOSIS — R0989 Other specified symptoms and signs involving the circulatory and respiratory systems: Secondary | ICD-10-CM | POA: Diagnosis not present

## 2017-10-24 DIAGNOSIS — R0609 Other forms of dyspnea: Secondary | ICD-10-CM | POA: Insufficient documentation

## 2017-10-24 DIAGNOSIS — I4510 Unspecified right bundle-branch block: Secondary | ICD-10-CM | POA: Diagnosis not present

## 2017-10-24 NOTE — Progress Notes (Signed)
New Outpatient Visit Date: 10/24/2017  Referring Provider: Philmore Pali, NP [place], [place] [address]  Chief Complaint: Elevated blood pressure  HPI:  Laura Garner is a 76 y.o. female who is being seen today for the evaluation of labile hypertension at the request of Lam, Rudi Rummage, NP. She has a history of hypertension, type 2 diabetes mellitus, hyperlipidemia, chronic kidney disease stage II, obesity, and GERD. Laura Garner reports a long history of high blood pressure but reports that her hypertension has been difficult to control over the last 2 years. She has been on multiple agents during this time, including amlodipine, hydralazine, hydrochlorothiazide, doxazosin, and lisinopril. She was intolerant of most of these medications or had suboptimal blood pressure response. Currently she is on a combination of carvedilol, losartan, and clonidine patch. She notes that her blood pressures at home fluctuate between 130-180/60-80. Sometimes, she feels a little bit lightheaded, which she attributes to high blood pressure. Laura Garner is never undergone workup for secondary hypertension.  Laura Garner has noted intermittent chest pain, predominantly when lying down at night. This is sometimes accompanied by palpitations. She has attributed this to acid reflux in the past, as the symptoms seemed to resolve when she takes pantoprazole. She also has chronic dyspnea on exertion when walking down her driveway or going up a few steps. This been present for approximately a year and seems to be getting a little bit worse. She denies orthopnea and PND but has chronic mild leg edema. At one point, she was on a diuretic but discontinued this due to increased urinary frequency. Amlodipine was also stopped previously due to leg swelling and a "foggy" sensation in her head. Though her edema improved somewhat, it has not completely  resolved.  --------------------------------------------------------------------------------------------------  Cardiovascular History & Procedures: Cardiovascular Problems:  Labile hypertension  Dyspnea on exertion  Atypical chest pain  Risk Factors:  Hypertension, hyperlipidemia, diabetes mellitus, obesity, sedentary lifestyle, and age greater than 48  Cath/PCI:  None  CV Surgery:  None  EP Procedures and Devices:  None  Non-Invasive Evaluation(s):  None  Recent CV Pertinent Labs: Lab Results  Component Value Date   K 4.3 03/21/2017   BUN 18 03/21/2017   CREATININE 0.74 03/21/2017    --------------------------------------------------------------------------------------------------  Past Medical History:  Diagnosis Date  . Anemia   . Anxiety    no meds  . Arthritis    neck - otc med prn  . Diabetes mellitus    type 2  . GERD (gastroesophageal reflux disease)   . Heart palpitations    hx - caused by medication  . History of kidney stones    multiple kidney stones -passed some stones and surgery x 3   . Hyperlipidemia   . Hypertension   . Reflux   . Seasonal allergies   . SVD (spontaneous vaginal delivery)    x 3    Past Surgical History:  Procedure Laterality Date  . lithrotripsy     surgery x 3  . right foot surgery     repair tendon  . SHOULDER SURGERY     x 2 - right and left    Current Meds  Medication Sig  . aspirin EC 81 MG tablet Take 81 mg by mouth daily.  . Calcium Carbonate-Vit D-Min (CALTRATE PLUS PO) Take 1 tablet by mouth daily.   . carvedilol (COREG) 12.5 MG tablet Take 12.5 mg by mouth 2 (two) times daily with a meal.  . cloNIDine (CATAPRES -  DOSED IN MG/24 HR) 0.1 mg/24hr patch   . ferrous sulfate 325 (65 FE) MG tablet Take 325 mg by mouth daily with breakfast.  . fluticasone (FLONASE) 50 MCG/ACT nasal spray Place 2 sprays into both nostrils daily.  Marland Kitchen loratadine (CLARITIN) 10 MG tablet Take 10 mg by mouth daily.  Marland Kitchen  losartan (COZAAR) 100 MG tablet Take 100 mg by mouth at bedtime.   . metFORMIN (GLUCOPHAGE) 1000 MG tablet Take 1,000 mg by mouth 2 (two) times daily with a meal.  . Multiple Vitamin (MULTIVITAMIN) capsule Take 1 capsule by mouth daily.   . Omega-3 Fatty Acids (FISH OIL) 1000 MG CPDR Take 1 capsule by mouth 2 (two) times daily.   . pantoprazole (PROTONIX) 40 MG tablet Take 40 mg by mouth daily as needed.   Vladimir Faster Glycol-Propyl Glycol (SYSTANE ULTRA OP) Place 1 drop into both eyes daily as needed (dry eyes).  . pravastatin (PRAVACHOL) 20 MG tablet Take 20 mg by mouth at bedtime.   . vitamin B-12 (CYANOCOBALAMIN) 1000 MCG tablet Take 1,000 mcg by mouth daily.  . [DISCONTINUED] pioglitazone (ACTOS) 45 MG tablet Take 45 mg by mouth daily.    Allergies: Avelox [moxifloxacin hcl]; Iodinated diagnostic agents; Iodine; and Trimethoprim  Social History   Socioeconomic History  . Marital status: Divorced    Spouse name: Not on file  . Number of children: Not on file  . Years of education: Not on file  . Highest education level: Not on file  Social Needs  . Financial resource strain: Not on file  . Food insecurity - worry: Not on file  . Food insecurity - inability: Not on file  . Transportation needs - medical: Not on file  . Transportation needs - non-medical: Not on file  Occupational History  . Not on file  Tobacco Use  . Smoking status: Never Smoker  . Smokeless tobacco: Never Used  Substance and Sexual Activity  . Alcohol use: No    Alcohol/week: 0.0 oz  . Drug use: No  . Sexual activity: Not on file  Other Topics Concern  . Not on file  Social History Narrative  . Not on file    Family History  Problem Relation Age of Onset  . Heart failure Mother   . Hypertension Father     Review of Systems: A 12-system review of systems was performed and was negative except as noted in the  HPI.  --------------------------------------------------------------------------------------------------  Physical Exam: BP (!) 182/80 (BP Location: Left Arm, Patient Position: Sitting, Cuff Size: Large)   Pulse 62   Ht 5' 1.5" (1.562 m)   Wt 215 lb 12 oz (97.9 kg)   SpO2 97%   BMI 40.11 kg/m   General:  Morbidly obese woman, seated comfortably in the exam room. HEENT: No conjunctival pallor or scleral icterus. Moist mucous membranes. OP clear. Neck: Supple without lymphadenopathy, thyromegaly, JVD, or HJR. No carotid bruit. Lungs: Normal work of breathing. Clear to auscultation bilaterally without wheezes or crackles. Heart: Regular rate and rhythm without murmurs, rubs, or gallops. Unable to assess PMI due to body habitus. Abd: Bowel sounds present. Soft, NT/ND . Unable to assess HSM due to body habitus. Ext: Trace pretibial edema bilaterally. Radial, PT, and DP pulses are 2+ bilaterally Skin: Warm and dry without rash. Neuro: CNIII-XII intact. Strength and fine-touch sensation intact in upper and lower extremities bilaterally. Psych: Normal mood and affect.  EKG:  Normal sinus rhythm with right bundle branch block and left ventricular hypertrophy.  Lab Results  Component Value Date   WBC 7.1 03/21/2017   HGB 11.4 (L) 03/21/2017   HCT 33.8 (L) 03/21/2017   MCV 96.8 03/21/2017   PLT 214 03/21/2017    Lab Results  Component Value Date   NA 136 03/21/2017   K 4.3 03/21/2017   CL 109 03/21/2017   CO2 22 03/21/2017   BUN 18 03/21/2017   CREATININE 0.74 03/21/2017   GLUCOSE 103 (H) 03/21/2017    No results found for: CHOL, HDL, LDLCALC, LDLDIRECT, TRIG, CHOLHDL   --------------------------------------------------------------------------------------------------  ASSESSMENT AND PLAN: Labile hypertension Blood pressure today is significantly elevated and consistent with prior readings. The patient is asymptomatic. EKG demonstrates normal sinus rhythm with LVH and right  bundle branch block. There is no prior tracing available for comparison. We will obtain a basic metabolic panel today as well as serum aldosterone and plasma renin activity. I will also obtain a renal artery Doppler, as the patient's difficult to control hypertension could indicate a secondary cause. If her renal function and electrolytes lower, I would like to rechallenge her with hydrochlorothiazide. I instructed the patient to limit her salt intake. If the aforementioned workup is unrevealing and her blood pressure remains difficult to control, sleep study will need to be considered.  Dyspnea on exertion and atypical chest pain Shortness of breath certainly could be due to diastolic dysfunction, though I suspect her underlying morbid obesity is also contributing quite a bit. Her chest pain is atypical, particularly given that it predominantly occurs at night when lying flat and resolves with PPI use. We will obtain a transthoracic echocardiogram to evaluate for structural abnormalities. If she has significantly reduced LVEF or regional wall motion abnormality, we will need to consider cardiac catheterization. Otherwise, we could likely get away with a noninvasive ischemia evaluation.  Palpitations These occur sporadically and seemed to be most pronounced when lying in bed. She attributes them to GERD, as well as the aforementioned atypical chest pain. We will defer further evaluation at this time. Laura Garner should continue her current dose of carvedilol.  Follow-up: Return to clinic in 6 weeks.  Nelva Bush, MD 10/24/2017 8:06 PM

## 2017-10-24 NOTE — Patient Instructions (Signed)
Medication Instructions:  Your physician recommends that you continue on your current medications as directed. Please refer to the Current Medication list given to you today.   Labwork: Your physician recommends that you return for lab work in: TODAY (BMP, SERUM ALDOSTERONE AND PLASMA RENIN ACTIVITY).   Testing/Procedures: Your physician has requested that you have an echocardiogram. Echocardiography is a painless test that uses sound waves to create images of your heart. It provides your doctor with information about the size and shape of your heart and how well your heart's chambers and valves are working. This procedure takes approximately one hour. There are no restrictions for this procedure.  Your physician has requested that you have a renal artery duplex. During this test, an ultrasound is used to evaluate blood flow to the kidneys. Allow one hour for this exam. Do not eat after midnight the day before and avoid carbonated beverages. Take your medications as you usually do.    Follow-Up: Your physician recommends that you schedule a follow-up appointment in: [place].   Echocardiogram An echocardiogram, or echocardiography, uses sound waves (ultrasound) to produce an image of your heart. The echocardiogram is simple, painless, obtained within a short period of time, and offers valuable information to your health care provider. The images from an echocardiogram can provide information such as:  Evidence of coronary artery disease (CAD).  Heart size.  Heart muscle function.  Heart valve function.  Aneurysm detection.  Evidence of a past heart attack.  Fluid buildup around the heart.  Heart muscle thickening.  Assess heart valve function.  Tell a health care provider about:  Any allergies you have.  All medicines you are taking, including vitamins, herbs, eye drops, creams, and over-the-counter medicines.  Any problems you or family members have had with  anesthetic medicines.  Any blood disorders you have.  Any surgeries you have had.  Any medical conditions you have.  Whether you are pregnant or may be pregnant. What happens before the procedure? No special preparation is needed. Eat and drink normally. What happens during the procedure?  In order to produce an image of your heart, gel will be applied to your chest and a wand-like tool (transducer) will be moved over your chest. The gel will help transmit the sound waves from the transducer. The sound waves will harmlessly bounce off your heart to allow the heart images to be captured in real-time motion. These images will then be recorded.  You may need an IV to receive a medicine that improves the quality of the pictures. What happens after the procedure? You may return to your normal schedule including diet, activities, and medicines, unless your health care provider tells you otherwise. This information is not intended to replace advice given to you by your health care provider. Make sure you discuss any questions you have with your health care provider. Document Released: 12/01/2000 Document Revised: 07/22/2016 Document Reviewed: 08/11/2013 Elsevier Interactive Patient Education  2017 [employer].

## 2017-10-26 ENCOUNTER — Other Ambulatory Visit: Payer: Self-pay | Admitting: Internal Medicine

## 2017-10-26 DIAGNOSIS — R0989 Other specified symptoms and signs involving the circulatory and respiratory systems: Secondary | ICD-10-CM

## 2017-10-27 LAB — BASIC METABOLIC PANEL
BUN/Creatinine Ratio: 22 (ref 12–28)
BUN: 17 mg/dL (ref 8–27)
CALCIUM: 9.7 mg/dL (ref 8.7–10.3)
CO2: 25 mmol/L (ref 20–29)
CREATININE: 0.78 mg/dL (ref 0.57–1.00)
Chloride: 103 mmol/L (ref 96–106)
GFR calc non Af Amer: 75 mL/min/{1.73_m2} (ref >59)
GFR, EST AFRICAN AMERICAN: 86 mL/min/{1.73_m2} (ref >59)
Glucose: 123 mg/dL — ABNORMAL HIGH (ref 65–99)
POTASSIUM: 4.2 mmol/L (ref 3.5–5.2)
Sodium: 143 mmol/L (ref 134–144)

## 2017-10-27 LAB — ALDOSTERONE + RENIN ACTIVITY W/ RATIO
ALDOS/RENIN RATIO: 18.3 (ref 0.0–30.0)
ALDOSTERONE: 7.3 ng/dL (ref 0.0–30.0)
RENIN: 0.399 ng/mL/h (ref 0.167–5.380)

## 2017-10-31 DIAGNOSIS — M4306 Spondylolysis, lumbar region: Secondary | ICD-10-CM | POA: Diagnosis not present

## 2017-10-31 DIAGNOSIS — M9904 Segmental and somatic dysfunction of sacral region: Secondary | ICD-10-CM | POA: Diagnosis not present

## 2017-10-31 DIAGNOSIS — M5388 Other specified dorsopathies, sacral and sacrococcygeal region: Secondary | ICD-10-CM | POA: Diagnosis not present

## 2017-10-31 DIAGNOSIS — M5431 Sciatica, right side: Secondary | ICD-10-CM | POA: Diagnosis not present

## 2017-10-31 DIAGNOSIS — M9905 Segmental and somatic dysfunction of pelvic region: Secondary | ICD-10-CM | POA: Diagnosis not present

## 2017-10-31 DIAGNOSIS — M9903 Segmental and somatic dysfunction of lumbar region: Secondary | ICD-10-CM | POA: Diagnosis not present

## 2017-11-02 ENCOUNTER — Ambulatory Visit (INDEPENDENT_AMBULATORY_CARE_PROVIDER_SITE_OTHER): Payer: Medicare Other

## 2017-11-02 ENCOUNTER — Other Ambulatory Visit: Payer: Self-pay

## 2017-11-02 DIAGNOSIS — R0989 Other specified symptoms and signs involving the circulatory and respiratory systems: Secondary | ICD-10-CM

## 2017-11-02 DIAGNOSIS — R0609 Other forms of dyspnea: Secondary | ICD-10-CM | POA: Diagnosis not present

## 2017-11-02 DIAGNOSIS — R002 Palpitations: Secondary | ICD-10-CM | POA: Diagnosis not present

## 2017-11-05 ENCOUNTER — Telehealth: Payer: Self-pay | Admitting: *Deleted

## 2017-11-05 DIAGNOSIS — M9905 Segmental and somatic dysfunction of pelvic region: Secondary | ICD-10-CM | POA: Diagnosis not present

## 2017-11-05 DIAGNOSIS — R0602 Shortness of breath: Secondary | ICD-10-CM

## 2017-11-05 DIAGNOSIS — G4730 Sleep apnea, unspecified: Secondary | ICD-10-CM

## 2017-11-05 DIAGNOSIS — M9904 Segmental and somatic dysfunction of sacral region: Secondary | ICD-10-CM | POA: Diagnosis not present

## 2017-11-05 DIAGNOSIS — M5388 Other specified dorsopathies, sacral and sacrococcygeal region: Secondary | ICD-10-CM | POA: Diagnosis not present

## 2017-11-05 DIAGNOSIS — M4306 Spondylolysis, lumbar region: Secondary | ICD-10-CM | POA: Diagnosis not present

## 2017-11-05 DIAGNOSIS — M9903 Segmental and somatic dysfunction of lumbar region: Secondary | ICD-10-CM | POA: Diagnosis not present

## 2017-11-05 DIAGNOSIS — R079 Chest pain, unspecified: Secondary | ICD-10-CM

## 2017-11-05 DIAGNOSIS — M5431 Sciatica, right side: Secondary | ICD-10-CM | POA: Diagnosis not present

## 2017-11-05 NOTE — Telephone Encounter (Signed)
-----   Message from Nelva Bush, MD sent at 11/04/2017  9:16 PM EST ----- Please let Laura Garner know that her renal artery Doppler does not show any significant narrowing of her renal arteries. Her echo shows normal contraction of her heart but mild to moderate pulmonary hypertension. This could certainly be due to an underlying lung process (particularly sleep apnea). I recommend obtaining a pharmacologic myocardial perfusion stress test for further evaluation of her shortness of breath and chest pain, as well as pulmonary referral for evaluation of chronic dyspnea and possible sleep apnea.

## 2017-11-05 NOTE — Telephone Encounter (Signed)
No phone number listed for patient at this time.S/w patient's daughter, Marlowe Kays, ok per DPR. She verbalized understanding of results and plan of care. Order for [facility] and pulmonary referral entered. Patient scheduled for Lexiscan on 11/12/17 at 0900, arrival 0845:am. Daughter verbalized understanding and the below instructions and will let the patient know all the information. Daughter aware to call our office back if she does not receive a call to schedule the referral appointment.   [place]  Your caregiver has ordered a Stress Test with nuclear imaging. The purpose of this test is to evaluate the blood supply to your heart muscle. This procedure is referred to as a "Non-Invasive Stress Test." This is because other than having an IV started in your vein, nothing is inserted or "invades" your body. Cardiac stress tests are done to find areas of poor blood flow to the heart by determining the extent of coronary artery disease (CAD). Some patients exercise on a treadmill, which naturally increases the blood flow to your heart, while others who are  unable to walk on a treadmill due to physical limitations have a pharmacologic/chemical stress agent called Lexiscan . This medicine will mimic walking on a treadmill by temporarily increasing your coronary blood flow.   Please note: these test may take anywhere between 2-4 hours to complete  PLEASE REPORT TO [place] AT THE FIRST DESK WILL DIRECT YOU WHERE TO GO  Date of Procedure:_______11/26/18________________  Arrival Time for Procedure:_____08:45 am__________  Instructions regarding medication:   _X_ : Hold diabetes medication morning of procedure- Metformin  _X_:  Hold betablocker(s) night before procedure and morning of procedure- Carvedilol   PLEASE NOTIFY THE OFFICE AT LEAST 24 HOURS IN ADVANCE IF YOU ARE UNABLE TO KEEP YOUR APPOINTMENT.  [phone] AND  PLEASE NOTIFY NUCLEAR MEDICINE AT [facility] AT  LEAST 24 HOURS IN ADVANCE IF YOU ARE UNABLE TO KEEP YOUR APPOINTMENT. [phone]  How to prepare for your Myoview test:  1. Do not eat or drink after midnight 2. No caffeine for 24 hours prior to test 3. No smoking 24 hours prior to test. 4. Your medication may be taken with water.  If your doctor stopped a medication because of this test, do not take that medication. 5. Ladies, please do not wear dresses.  Skirts or pants are appropriate. Please wear a short sleeve shirt. 6. No perfume, cologne or lotion. 7. Wear comfortable walking shoes. No heels!

## 2017-11-12 ENCOUNTER — Encounter
Admission: RE | Admit: 2017-11-12 | Discharge: 2017-11-12 | Disposition: A | Discharge status: Home / Self Care | Payer: Medicare Other | Source: Ambulatory Visit | Attending: Internal Medicine | Admitting: Internal Medicine

## 2017-11-12 DIAGNOSIS — R0602 Shortness of breath: Secondary | ICD-10-CM | POA: Insufficient documentation

## 2017-11-12 DIAGNOSIS — R079 Chest pain, unspecified: Secondary | ICD-10-CM

## 2017-11-12 LAB — NM MYOCAR MULTI W/SPECT W/WALL MOTION / EF
CHL CUP NUCLEAR SDS: 8
CHL CUP RESTING HR STRESS: 61 {beats}/min
LVDIAVOL: 68 mL (ref 46–106)
LVSYSVOL: 24 mL
NUC STRESS TID: 0.9
Peak HR: 85 {beats}/min
Percent HR: 58 %
SRS: 2
SSS: 10

## 2017-11-12 MED ORDER — TECHNETIUM TC 99M TETROFOSMIN IV KIT
13.1700 | PACK | Freq: Once | INTRAVENOUS | Status: AC | PRN
  Administered 2017-11-12: 13.17 via INTRAVENOUS

## 2017-11-12 MED ORDER — TECHNETIUM TC 99M TETROFOSMIN IV KIT
30.0000 | PACK | Freq: Once | INTRAVENOUS | Status: AC | PRN
  Administered 2017-11-12: 28.73 via INTRAVENOUS

## 2017-11-12 MED ORDER — REGADENOSON 0.4 MG/5ML IV SOLN
0.4000 mg | Freq: Once | INTRAVENOUS | Status: AC
  Administered 2017-11-12: 0.4 mg via INTRAVENOUS

## 2017-11-14 DIAGNOSIS — M9905 Segmental and somatic dysfunction of pelvic region: Secondary | ICD-10-CM | POA: Diagnosis not present

## 2017-11-14 DIAGNOSIS — M5388 Other specified dorsopathies, sacral and sacrococcygeal region: Secondary | ICD-10-CM | POA: Diagnosis not present

## 2017-11-14 DIAGNOSIS — M4306 Spondylolysis, lumbar region: Secondary | ICD-10-CM | POA: Diagnosis not present

## 2017-11-14 DIAGNOSIS — M9903 Segmental and somatic dysfunction of lumbar region: Secondary | ICD-10-CM | POA: Diagnosis not present

## 2017-11-14 DIAGNOSIS — M5431 Sciatica, right side: Secondary | ICD-10-CM | POA: Diagnosis not present

## 2017-11-14 DIAGNOSIS — M9904 Segmental and somatic dysfunction of sacral region: Secondary | ICD-10-CM | POA: Diagnosis not present

## 2017-11-22 DIAGNOSIS — Z6839 Body mass index (BMI) 39.0-39.9, adult: Secondary | ICD-10-CM | POA: Diagnosis not present

## 2017-11-22 DIAGNOSIS — I10 Essential (primary) hypertension: Secondary | ICD-10-CM | POA: Diagnosis not present

## 2017-11-22 DIAGNOSIS — E1149 Type 2 diabetes mellitus with other diabetic neurological complication: Secondary | ICD-10-CM | POA: Diagnosis not present

## 2017-12-05 ENCOUNTER — Encounter: Payer: Self-pay | Admitting: Nurse Practitioner

## 2017-12-05 ENCOUNTER — Ambulatory Visit (INDEPENDENT_AMBULATORY_CARE_PROVIDER_SITE_OTHER): Payer: Medicare Other | Admitting: Nurse Practitioner

## 2017-12-05 ENCOUNTER — Encounter (INDEPENDENT_AMBULATORY_CARE_PROVIDER_SITE_OTHER): Payer: Self-pay

## 2017-12-05 VITALS — BP 180/88 | HR 61 | Ht 61.5 in | Wt 213.5 lb

## 2017-12-05 DIAGNOSIS — R0609 Other forms of dyspnea: Secondary | ICD-10-CM

## 2017-12-05 DIAGNOSIS — I10 Essential (primary) hypertension: Secondary | ICD-10-CM | POA: Diagnosis not present

## 2017-12-05 DIAGNOSIS — K219 Gastro-esophageal reflux disease without esophagitis: Secondary | ICD-10-CM

## 2017-12-05 MED ORDER — CHLORTHALIDONE 25 MG PO TABS: 12.5000 mg | ORAL_TABLET | Freq: Every day | ORAL | 3 refills | Status: DC

## 2017-12-05 MED ORDER — POTASSIUM CHLORIDE ER 10 MEQ PO TBCR: 10.0000 meq | EXTENDED_RELEASE_TABLET | Freq: Every day | ORAL | 3 refills | Status: DC

## 2017-12-05 NOTE — Progress Notes (Signed)
Office Visit    Patient Name: Laura Garner Date of Encounter: 12/05/2017  Primary Care Provider:  Isaias Sakai, DO Primary Cardiologist:  Laura Bush, MD  Chief Complaint    76 y/o ? with a h/o difficult to control HTN, HL, DMII, CKD II, obesity, and GERD, who presents for f/u related to HTN and recent testing.  Past Medical History    Past Medical History:  Diagnosis Date  . Anemia   . Anxiety    no meds  . Arthritis    neck - otc med prn  . Chest pain    a. 10/2017 MV: EF 55-65%, no ischemia, infarct.  . Diabetes mellitus    type 2  . Diastolic dysfunction    a. 10/2017 Echo: EF 55-60%, no rwma, Gr1 DD, mod dil LA, PASP 52mmHg.  Marland Kitchen GERD (gastroesophageal reflux disease)   . Heart palpitations    hx - caused by medication  . History of kidney stones    multiple kidney stones -passed some stones and surgery x 3   . Hyperlipidemia   . Hypertension    a. Difficult to control-->10/2017 Renal Duplex: < 60% bilat RAS.  Marland Kitchen PAH (pulmonary artery hypertension) ([place])    a. 10/2017 Echo: PASP 39mmHg.  Marland Kitchen RBBB   . Reflux   . Seasonal allergies   . SVD (spontaneous vaginal delivery)    x 3   Past Surgical History:  Procedure Laterality Date  . HYSTEROSCOPY W/D&C N/A 03/22/2017   Procedure: DILATATION AND CURETTAGE /HYSTEROSCOPY;  Surgeon: Laura Pigg, MD;  Location: [facility];  Service: Gynecology;  Laterality: N/A;  . lithrotripsy     surgery x 3  . right foot surgery     repair tendon  . SHOULDER SURGERY     x 2 - right and left    Allergies  Allergies  Allergen Reactions  . Amlodipine     Swelling/'foggy sensation'  . Avelox [Moxifloxacin Hcl]     Weakness and felt bad  . Iodinated Diagnostic Agents   . Iodine   . Trimethoprim Other (See Comments) and Diarrhea    Extreme diarrhea     History of Present Illness    76 y/o ? with a h/o difficult to control HTN, HL, DMII, CKD II, obesity, and GERD.  She was recently seen by C. End, MD in Nov  in the setting of a 2 year h/o more difficult to control HTN.  She had been on multiple agents over time including, amlodipine, hydralazine, HCTZ, doxazosin, and lisinopril.  She had intolerances to multiple meds.  At the time of the visit, she was on carvedilol, losartan, and clonidine patch.  She also reported chronic DOE and intermittent chest discomfort and palpitations.  W/u for secondary HTN was undertaken.  Serum Aldosterone/plasma renin activity was wnl.  Renal duplex did not show any significant RAS.  Echo was performed and showed nl EF w/ Gr1 DD, and PAH (PASP 33mmHg).  Stress testing was non-ischemic.  Recommendation was made to continue PPI therapy and follow-up with pulmonology for sleep study.  Since her last visit, Laura Garner has felt well.  She continues to have mild, chronic dyspnea on exertion.  She attributes this mostly to difficulty walking related to problems with her feet and an unsteady gait.  She says the active walking is just a heavy workload for and makes her short of breath.  She has noted some increasing lower extremity swelling recently.  She checks her  blood pressure regularly at home and notes that it typically runs in the 150s-170s.  She is 180/88 today.  She denies palpitations, PND, orthopnea, dizziness, syncope, or early satiety.  Home Medications    Prior to Admission medications   Medication Sig Start Date Garner Date Taking? Authorizing Provider  aspirin EC 81 MG tablet Take 81 mg by mouth daily.    [provider]  Calcium Carbonate-Vit D-Min (CALTRATE PLUS PO) Take 1 tablet by mouth daily.     [provider]  carvedilol (COREG) 12.5 MG tablet Take 12.5 mg by mouth 2 (two) times daily with a meal.    [provider]  cloNIDine (CATAPRES - DOSED IN MG/24 HR) 0.1 mg/24hr patch  09/13/17   [provider]  ferrous sulfate 325 (65 FE) MG tablet Take 325 mg by mouth daily with breakfast.    [provider]  fluticasone (FLONASE)  50 MCG/ACT nasal spray Place 2 sprays into both nostrils daily.    [provider]  loratadine (CLARITIN) 10 MG tablet Take 10 mg by mouth daily.    [provider]  losartan (COZAAR) 100 MG tablet Take 100 mg by mouth at bedtime.     [provider]  metFORMIN (GLUCOPHAGE) 1000 MG tablet Take 1,000 mg by mouth 2 (two) times daily with a meal.    [provider]  Multiple Vitamin (MULTIVITAMIN) capsule Take 1 capsule by mouth daily.     [provider]  Omega-3 Fatty Acids (FISH OIL) 1000 MG CPDR Take 1 capsule by mouth 2 (two) times daily.     [provider]  pantoprazole (PROTONIX) 40 MG tablet Take 40 mg by mouth daily as needed.  05/31/15   [provider]  Polyethyl Glycol-Propyl Glycol (SYSTANE ULTRA OP) Place 1 drop into both eyes daily as needed (dry eyes).    [provider]  pravastatin (PRAVACHOL) 20 MG tablet Take 20 mg by mouth at bedtime.     [provider]  vitamin B-12 (CYANOCOBALAMIN) 1000 MCG tablet Take 1,000 mcg by mouth daily.    [provider]    Review of Systems    Chronic dyspnea on exertion.  Recent mild lower extremity swelling.  She denies chest pain, palpitations, pnd, orthopnea, n, v, dizziness, syncope, weight gain, or early satiety.  All other systems reviewed and are otherwise negative except as noted above.  Physical Exam    VS:  BP (!) 180/88 (BP Location: Left Arm, Patient Position: Sitting, Cuff Size: Large)   Pulse 61   Ht 5' 1.5" (1.562 m)   Wt 213 lb 8 oz (96.8 kg)   BMI 39.69 kg/m  , BMI Body mass index is 39.69 kg/m. GEN: Well nourished, well developed, in no acute distress.  HEENT: normal.  Neck: Supple, no JVD, carotid bruits, or masses. Cardiac: RRR, no murmurs, rubs, or gallops. No clubbing, cyanosis, 1+ bilateral lower extremity edema.  Radials/DP/PT 2+ and equal bilaterally.  Respiratory:  Respirations regular and unlabored, clear to auscultation  bilaterally. GI: Soft, nontender, nondistended, BS + x 4. MS: no deformity or atrophy. Skin: warm and dry, no rash. Neuro:  Strength and sensation are intact. Psych: Normal affect.  Accessory Clinical Findings    ECG -regular sinus rhythm, 61, left axis deviation, right bundle branch block, LVH  Assessment & Plan    1.  Essential hypertension: Blood pressure remains elevated today.  Recent evaluation included workup for secondary hypertension including normal aldosterone, renin, and aldosterone/rennin  ratio; finding of no significant renal artery stenosis, normal LV function by echo, and nonischemic stress testing.  Renal function and electrolytes were also normal on November 7.  In that setting, I will add chlorthalidone 12.5 mg daily.  I suspect she will require potassium supplementation and we will start with 10 mEq daily.  Plan to follow-up basic metabolic panel in 1 week.  Of note, she was previously on hydrochlorothiazide and she is not sure if she had much effect from it or not but came off of it ultimately because she was having some incontinence.  She is willing to try chlorthalidone and understands that this may also result in incontinence but she says at this point, she can live with it.  Titration of beta-blocker clonidine are not great options in the setting of baseline relative bradycardia and also dry mouth.  Could consider spironolactone in the future.  2.  Chronic dyspnea on exertion: Stable.  Echo showed normal LV function while stress testing was nonischemic.  Pulmonary artery systolic pressure was 45 mmHg on echo.  She does have follow-up with pulmonology for evaluation and probable sleep study.  3.  Chest discomfort: Patient reported some chest discomfort when lying in bed at night.  This improved with PPI therapy.  Recent stress test was negative.  4.  GERD: Continue PPI.  5.  Disposition: Follow-up basic metabolic panel in 1 week.  Follow-up in clinic in 1 month or sooner  if necessary.  Murray Hodgkins, NP 12/05/2017, 3:42 PM

## 2017-12-05 NOTE — Patient Instructions (Addendum)
Medication Instructions: - Your physician has recommended you make the following change in your medication:  1) Start chlorthalidone 25 mg- take 1/2 tablet (12.5 mg) by mouth once daily 2) Start potassium 10 meq- take 1 tablet by mouth once daily  Labwork: - Your physician recommends that you return for lab work in: BMP- 1 week   Procedures/Testing: - none ordered  Follow-Up: - Your physician recommends that you schedule a follow-up appointment in: 1 month with Dr. Caesar Chestnut, NP   Any Additional Special Instructions Will Be Listed Below (If Applicable).     If you need a refill on your cardiac medications before your next appointment, please call your pharmacy.

## 2017-12-14 ENCOUNTER — Other Ambulatory Visit (INDEPENDENT_AMBULATORY_CARE_PROVIDER_SITE_OTHER): Payer: Medicare Other

## 2017-12-14 DIAGNOSIS — I10 Essential (primary) hypertension: Secondary | ICD-10-CM | POA: Diagnosis not present

## 2017-12-14 DIAGNOSIS — R0609 Other forms of dyspnea: Secondary | ICD-10-CM | POA: Diagnosis not present

## 2017-12-15 LAB — BASIC METABOLIC PANEL
BUN/Creatinine Ratio: 23 (ref 12–28)
BUN: 19 mg/dL (ref 8–27)
CALCIUM: 9.6 mg/dL (ref 8.7–10.3)
CO2: 22 mmol/L (ref 20–29)
CREATININE: 0.81 mg/dL (ref 0.57–1.00)
Chloride: 106 mmol/L (ref 96–106)
GFR, EST AFRICAN AMERICAN: 82 mL/min/{1.73_m2} (ref >59)
GFR, EST NON AFRICAN AMERICAN: 71 mL/min/{1.73_m2} (ref >59)
Glucose: 143 mg/dL — ABNORMAL HIGH (ref 65–99)
POTASSIUM: 4.5 mmol/L (ref 3.5–5.2)
Sodium: 143 mmol/L (ref 134–144)

## 2017-12-19 ENCOUNTER — Ambulatory Visit (INDEPENDENT_AMBULATORY_CARE_PROVIDER_SITE_OTHER): Payer: Medicare Other | Admitting: Pulmonary Disease

## 2017-12-19 ENCOUNTER — Encounter: Payer: Self-pay | Admitting: Pulmonary Disease

## 2017-12-19 VITALS — BP 160/80 | HR 54 | Resp 16 | Ht 61.5 in | Wt 208.0 lb

## 2017-12-19 DIAGNOSIS — I10 Essential (primary) hypertension: Secondary | ICD-10-CM

## 2017-12-19 DIAGNOSIS — G4719 Other hypersomnia: Secondary | ICD-10-CM

## 2017-12-19 DIAGNOSIS — G4710 Hypersomnia, unspecified: Secondary | ICD-10-CM

## 2017-12-19 NOTE — Patient Instructions (Signed)
Sleep study has been requested.  Hopefully we will be able to do this is a home sleep study.  We will have to check with the third party payer.  After the results of the sleep study are available to me we will contact you for further management and/or follow-up

## 2017-12-19 NOTE — Progress Notes (Signed)
PULMONARY CONSULT NOTE  Requesting MD/Service: Ignacia Bayley, PA Date of initial consultation: 12/19/17 Reason for consultation: Severe hypertension, rule out OSA  PT PROFILE: 77 y.o. female never smoker referred to rule out OSA due to refractory hypertension  HPI:  As above.  She has had long-standing hypertension but it has been particularly difficult to manage over the past 6 months.  Therefore, she is referred for evaluation of possible obstructive sleep apnea.  She does have moderate obesity with progressive weight gain over all of her adult life.  She presently weighs 208 pounds.  She weighed approximately 135 pounds in her mid 39s.  She has been told in the past that she snores.  She is unable to tell if she snores presently since she sleeps alone.  She does awaken 1-2 times per night with nocturia.  She typically sleeps from approximately 10:30 PM to 6:30 AM.  She usually awakens without an alarm.  She feels reasonably well rested when she awakens.  She denies morning headaches.  She does nap frequently during the daytime.  Her Epworth sleepiness scale score was 6.  Past Medical History:  Diagnosis Date  . Anemia   . Anxiety    no meds  . Arthritis    neck - otc med prn  . Chest pain    a. 10/2017 MV: EF 55-65%, no ischemia, infarct.  . Diabetes mellitus    type 2  . Diastolic dysfunction    a. 10/2017 Echo: EF 55-60%, no rwma, Gr1 DD, mod dil LA, PASP 18mmHg.  Marland Kitchen GERD (gastroesophageal reflux disease)   . Heart palpitations    hx - caused by medication  . History of kidney stones    multiple kidney stones -passed some stones and surgery x 3   . Hyperlipidemia   . Hypertension    a. Difficult to control-->10/2017 Renal Duplex: < 60% bilat RAS.  Marland Kitchen PAH (pulmonary artery hypertension) ([place])    a. 10/2017 Echo: PASP 18mmHg.  Marland Kitchen RBBB   . Reflux   . Seasonal allergies   . SVD (spontaneous vaginal delivery)    x 3    Past Surgical History:  Procedure Laterality Date  .  HYSTEROSCOPY W/D&C N/A 03/22/2017   Procedure: DILATATION AND CURETTAGE /HYSTEROSCOPY;  Surgeon: Newton Pigg, MD;  Location: [facility];  Service: Gynecology;  Laterality: N/A;  . lithrotripsy     surgery x 3  . right foot surgery     repair tendon  . SHOULDER SURGERY     x 2 - right and left    MEDICATIONS: I have reviewed all medications and confirmed regimen as documented  Social History   Socioeconomic History  . Marital status: Divorced    Spouse name: Not on file  . Number of children: Not on file  . Years of education: Not on file  . Highest education level: Not on file  Social Needs  . Financial resource strain: Not on file  . Food insecurity - worry: Not on file  . Food insecurity - inability: Not on file  . Transportation needs - medical: Not on file  . Transportation needs - non-medical: Not on file  Occupational History  . Not on file  Tobacco Use  . Smoking status: Never Smoker  . Smokeless tobacco: Never Used  Substance and Sexual Activity  . Alcohol use: No    Alcohol/week: 0.0 oz  . Drug use: No  . Sexual activity: Not on file  Other Topics Concern  . Not  on file  Social History Narrative  . Not on file    Family History  Problem Relation Age of Onset  . Heart failure Mother   . Hypertension Father     ROS: No fever, myalgias/arthralgias, unexplained weight loss or weight gain No new focal weakness or sensory deficits No otalgia, hearing loss, visual changes, nasal and sinus symptoms, mouth and throat problems No neck pain or adenopathy No abdominal pain, N/V/D, diarrhea, change in bowel pattern No dysuria, change in urinary pattern   Vitals:   12/19/17 1103 12/19/17 1116  BP:  (!) 160/80  Pulse:  (!) 54  Resp: 16   SpO2:  99%  Weight: 94.3 kg (208 lb)   Height: 5' 1.5" (1.562 m)      EXAM:  Gen: WDWN, No overt respiratory distress HEENT: NCAT, sclera white, relatively small pharyngeal orifice Neck: Supple without LAN, thyromegaly,  JVD Lungs: breath sounds full without adventitious sounds Cardiovascular: RRR, no murmurs noted Abdomen: Soft, nontender, normal BS Ext: without clubbing, cyanosis, edema Neuro: CNs grossly intact, motor and sensory intact Skin: Limited exam, no lesions noted  DATA:   BMP Latest Ref Rng & Units 12/14/2017 10/24/2017 03/21/2017  Glucose 65 - 99 mg/dL 143(H) 123(H) 103(H)  BUN 8 - 27 mg/dL 19 17 18   Creatinine 0.57 - 1.00 mg/dL 0.81 0.78 0.74  BUN/Creat Ratio 12 - 28 23 22  -  Sodium 134 - 144 mmol/L 143 143 136  Potassium 3.5 - 5.2 mmol/L 4.5 4.2 4.3  Chloride 96 - 106 mmol/L 106 103 109  CO2 20 - 29 mmol/L 22 25 22   Calcium 8.7 - 10.3 mg/dL 9.6 9.7 9.2    CBC Latest Ref Rng & Units 03/21/2017 06/26/2007  WBC 4.0 - 10.5 K/uL 7.1 -  Hemoglobin 12.0 - 15.0 g/dL 11.4(L) 10.2(L)  Hematocrit 36.0 - 46.0 % 33.8(L) -  Platelets 150 - 400 K/uL 214 -    CXR: No recent films  IMPRESSION:   Refractory hypertension Mild daytime hypersomnolence Rule out obstructive sleep apnea  PLAN:  Polysomnogram has been ordered.  Hopefully we will be able to do this as an in-home study.  After the results are available to me, we will contact her with further instructions which might include initiation of CPAP versus return office visit to discuss results of sleep study   Merton Border, MD PCCM service Mobile [phone] Pager [phone] 12/19/2017 4:51 PM

## 2018-01-02 ENCOUNTER — Encounter: Payer: Self-pay | Admitting: Internal Medicine

## 2018-01-02 DIAGNOSIS — G4719 Other hypersomnia: Secondary | ICD-10-CM

## 2018-01-02 DIAGNOSIS — I10 Essential (primary) hypertension: Secondary | ICD-10-CM

## 2018-01-02 DIAGNOSIS — G4733 Obstructive sleep apnea (adult) (pediatric): Secondary | ICD-10-CM | POA: Diagnosis not present

## 2018-01-02 DIAGNOSIS — G4710 Hypersomnia, unspecified: Secondary | ICD-10-CM

## 2018-01-04 DIAGNOSIS — G4733 Obstructive sleep apnea (adult) (pediatric): Secondary | ICD-10-CM | POA: Diagnosis not present

## 2018-01-09 ENCOUNTER — Encounter: Payer: Self-pay | Admitting: Nurse Practitioner

## 2018-01-09 ENCOUNTER — Ambulatory Visit (INDEPENDENT_AMBULATORY_CARE_PROVIDER_SITE_OTHER): Payer: Medicare Other | Admitting: Nurse Practitioner

## 2018-01-09 VITALS — BP 170/80 | HR 57 | Ht 61.5 in | Wt 206.2 lb

## 2018-01-09 DIAGNOSIS — R0609 Other forms of dyspnea: Secondary | ICD-10-CM

## 2018-01-09 DIAGNOSIS — I10 Essential (primary) hypertension: Secondary | ICD-10-CM

## 2018-01-09 NOTE — Progress Notes (Signed)
Office Visit    Patient Name: Laura Garner Date of Encounter: 01/09/2018  Primary Care Provider:  Isaias Sakai, DO Primary Cardiologist:  Nelva Bush, MD  Chief Complaint    77 year old female with a history of difficult to control hypertension, hyper lipidemia, type 2 diabetes mellitus, stage II chronic kidney disease, obesity, and GERD who presents for follow-up.  Past Medical History    Past Medical History:  Diagnosis Date  . Anemia   . Anxiety    no meds  . Arthritis    neck - otc med prn  . Chest pain    a. 10/2017 MV: EF 55-65%, no ischemia, infarct.  . Diabetes mellitus    type 2  . Diastolic dysfunction    a. 10/2017 Echo: EF 55-60%, no rwma, Gr1 DD, mod dil LA, PASP 80mmHg.  Marland Kitchen GERD (gastroesophageal reflux disease)   . Heart palpitations    hx - caused by medication  . History of kidney stones    multiple kidney stones -passed some stones and surgery x 3   . Hyperlipidemia   . Hypertension    a. Difficult to control-->10/2017 Renal Duplex: < 60% bilat RAS.  Marland Kitchen PAH (pulmonary artery hypertension) ([place])    a. 10/2017 Echo: PASP 75mmHg.  Marland Kitchen RBBB   . Reflux   . Seasonal allergies   . SVD (spontaneous vaginal delivery)    x 3   Past Surgical History:  Procedure Laterality Date  . HYSTEROSCOPY W/D&C N/A 03/22/2017   Procedure: DILATATION AND CURETTAGE /HYSTEROSCOPY;  Surgeon: Newton Pigg, MD;  Location: [facility];  Service: Gynecology;  Laterality: N/A;  . lithrotripsy     surgery x 3  . right foot surgery     repair tendon  . SHOULDER SURGERY     x 2 - right and left    Allergies  Allergies  Allergen Reactions  . Amlodipine     Swelling/'foggy sensation'  . Avelox [Moxifloxacin Hcl]     Weakness and felt bad  . Doxazosin     Explosive diarrhea   . Hydralazine     anxiety  . Iodinated Diagnostic Agents   . Iodine   . Trimethoprim Other (See Comments) and Diarrhea    Extreme diarrhea     History of Present Illness      77 year old female with the above complex past medical history including difficult to control hypertension, hyperlipidemia, type 2 diabetes mellitus, stage II chronic kidney disease, obesity, and GERD.  She has had multiple medication intolerances in the setting of trying to get her blood pressure down including amlodipine, hydralazine, HCTZ, doxazosin, and lisinopril.  In November 2018, she underwent workup for secondary hypertension and was found to have normal serum aldosterone/plasma renin activity, no significant renal artery stenosis on renal duplex, and normal LV function on echocardiogram.  Grade 1 diastolic dysfunction and PAH with a PAS P of 45 mmHg was noted.  Stress testing was undertaken and was nonischemic.  I saw her in December 19 and secondary to elevated blood pressure, I added chlorthalidone 12.5 mg daily.  Follow-up basic metabolic panel in late December was stable.  She has tolerated chlorthalidone well and continues to track her blood pressure at home.  She has noticed a significant improvement in blood pressure management and home blood pressure recordings are typically in the 140s, whereas they used to regularly be in the 170s-180s.  She has not had any significant incontinence in the setting of the chlorthalidone, at  least at this dose.  Incontinence was a problem with hydrochlorothiazide in the past.  She has been experiencing some diarrhea since starting both chlorthalidone and potassium.  She thinks the potassium may be causing this diarrhea.  She wishes to stop it.  Her potassium was 4.5 in December 28 and she is only currently taking 10 mEq daily.  In the setting of improved blood pressures and chlorthalidone therapy, she has had significant improvement in lower extremity swelling and dyspnea.  She denies chest pain, palpitations, PND, orthopnea, dizziness, syncope, or early satiety.  Home Medications    Prior to Admission medications   Medication Sig Start Date End Date  Taking? Authorizing Provider  aspirin EC 81 MG tablet Take 81 mg by mouth daily.   Yes [provider]  Calcium Carbonate-Vit D-Min (CALTRATE PLUS PO) Take 1 tablet by mouth daily.    Yes [provider]  carvedilol (COREG) 12.5 MG tablet Take 12.5 mg by mouth 2 (two) times daily with a meal.   Yes [provider]  chlorthalidone (HYGROTON) 25 MG tablet Take 0.5 tablets (12.5 mg total) by mouth daily. 12/05/17 03/05/18 Yes Theora Gianotti, NP  cloNIDine (CATAPRES - DOSED IN MG/24 HR) 0.1 mg/24hr patch  09/13/17  Yes [provider]  ferrous sulfate 325 (65 FE) MG tablet Take 325 mg by mouth daily with breakfast.   Yes [provider]  fluticasone (FLONASE) 50 MCG/ACT nasal spray Place 2 sprays into both nostrils daily.   Yes [provider]  loratadine (CLARITIN) 10 MG tablet Take 10 mg by mouth daily.   Yes [provider]  losartan (COZAAR) 100 MG tablet Take 100 mg by mouth at bedtime.    Yes [provider]  metFORMIN (GLUCOPHAGE) 1000 MG tablet Take 1,000 mg by mouth 2 (two) times daily with a meal.   Yes [provider]  Multiple Vitamin (MULTIVITAMIN) capsule Take 1 capsule by mouth daily.    Yes [provider]  Omega-3 Fatty Acids (FISH OIL) 1000 MG CPDR Take 1 capsule by mouth 2 (two) times daily.    Yes [provider]  pantoprazole (PROTONIX) 40 MG tablet Take 40 mg by mouth daily as needed.  05/31/15  Yes [provider]  Polyethyl Glycol-Propyl Glycol (SYSTANE ULTRA OP) Place 1 drop into both eyes daily as needed (dry eyes).   Yes [provider]  pravastatin (PRAVACHOL) 20 MG tablet Take 20 mg by mouth at bedtime.    Yes [provider]  vitamin B-12 (CYANOCOBALAMIN) 1000 MCG tablet Take 1,000 mcg by mouth daily.   Yes [provider]    Review of Systems    Overall doing much better than she was at her last visit.  Less edema and dyspnea.   Blood pressures improved overall though elevated today.  She denies chest pain, palpitations, PND, orthopnea, dizziness, syncope, or early satiety.  All other systems reviewed and are otherwise negative except as noted above.  Physical Exam    VS:  BP (!) 170/80 (BP Location: Left Arm, Patient Position: Sitting, Cuff Size: Large)   Pulse (!) 57   Ht 5' 1.5" (1.562 m)   Wt 206 lb 4 oz (93.6 kg)   BMI 38.34 kg/m  , BMI Body mass index is 38.34 kg/m. GEN: Well nourished, well developed, in no acute distress.  HEENT: normal.  Neck: Supple, no JVD, carotid bruits, or masses. Cardiac: RRR, no murmurs, rubs, or gallops. No clubbing, cyanosis, trace bilateral ankle  edema.  Radials/DP/PT 2+ and equal bilaterally.  Respiratory:  Respirations regular and unlabored, clear to auscultation bilaterally. GI: Soft, nontender, nondistended, BS + x 4. MS: no deformity or atrophy. Skin: warm and dry, no rash. Neuro:  Strength and sensation are intact. Psych: Normal affect.  Accessory Clinical Findings    ECG -sinus bradycardia, 57, left axis deviation, right bundle branch block, LVH.  No acute changes.  Assessment & Plan    1.  Essential hypertension: Blood pressure is elevated today at 170/80.  She does have a list of her blood pressures at home and she typically runs in the 140s and sometimes 150s.  Overall, this is a significant improvement compared to prior readings late last year where she was typically in the 170s-180s.  She is currently on carvedilol, clonidine patch, losartan, and chlorthalidone.  Chlorthalidone is the most recent addition and she is tolerating this well and has noted significant improvement in lower extremity swelling and dyspnea since starting this.  She is also been experiencing some diarrhea however and she attributes this to potassium supplementation.  I have advised her that she may discontinue potassium (only taking 10 mEq daily) given a potassium of 4.5 on December 28.  I  will arrange for follow-up basic metabolic panel in 1 week.  I have advised her that if diarrhea does not clear despite stopping potassium, she needs to contact us at which point, we will have to discontinue her chlorthalidone.  We could try Spironolactone at that time.  I am reluctant to titrate chlorthalidone or aspirin lactone now given the question of the cause of diarrhea.  2.  Chronic dyspnea on exertion: Improved slightly with the addition of chlorthalidone.  Previously normal LV function by echo with nonischemic stress testing in November 2018.  She has been seen by pulmonology and just underwent at home sleep study.  Results pending.  3.  Chest discomfort: Previously improved with PPI therapy.  Nonischemic stress test in November 2018.  4.  GERD: Stable on PPI.  5.  Disposition: Follow-up basic metabolic panel in 1 week.  Patient will contact us if diarrhea does not improve despite discontinuation of potassium.  Follow-up in 1 month or sooner if necessary.   Murray Hodgkins, NP 01/09/2018, 5:34 PM

## 2018-01-09 NOTE — Patient Instructions (Signed)
Medication Instructions: - Your physician has recommended you make the following change in your medication:  1) STOP potassium  Labwork: - Your physician recommends that you return for lab work in: 1 week- BMP  Procedures/Testing: - none ordered  Follow-Up: - Your physician recommends that you schedule a follow-up appointment in: 1 month with Dr. Caesar Chestnut, NP   Any Additional Special Instructions Will Be Listed Below (If Applicable).     If you need a refill on your cardiac medications before your next appointment, please call your pharmacy.

## 2018-01-10 ENCOUNTER — Telehealth: Payer: Self-pay | Admitting: *Deleted

## 2018-01-10 DIAGNOSIS — G4733 Obstructive sleep apnea (adult) (pediatric): Secondary | ICD-10-CM

## 2018-01-10 NOTE — Telephone Encounter (Signed)
Patient's daughter aware of results. Orders have been placled.

## 2018-01-10 NOTE — Telephone Encounter (Signed)
LMTCB for results of sleep study. 

## 2018-01-10 NOTE — Telephone Encounter (Signed)
Patient daughter Marlowe Kays returning call

## 2018-01-11 ENCOUNTER — Ambulatory Visit (INDEPENDENT_AMBULATORY_CARE_PROVIDER_SITE_OTHER): Payer: Medicare Other

## 2018-01-11 ENCOUNTER — Encounter: Payer: Self-pay | Admitting: Podiatry

## 2018-01-11 ENCOUNTER — Ambulatory Visit (INDEPENDENT_AMBULATORY_CARE_PROVIDER_SITE_OTHER): Payer: Medicare Other | Admitting: Podiatry

## 2018-01-11 DIAGNOSIS — M7662 Achilles tendinitis, left leg: Secondary | ICD-10-CM | POA: Diagnosis not present

## 2018-01-11 DIAGNOSIS — S99912A Unspecified injury of left ankle, initial encounter: Secondary | ICD-10-CM | POA: Diagnosis not present

## 2018-01-11 DIAGNOSIS — M79674 Pain in right toe(s): Secondary | ICD-10-CM | POA: Diagnosis not present

## 2018-01-11 DIAGNOSIS — M79675 Pain in left toe(s): Secondary | ICD-10-CM | POA: Diagnosis not present

## 2018-01-11 DIAGNOSIS — B351 Tinea unguium: Secondary | ICD-10-CM | POA: Diagnosis not present

## 2018-01-11 DIAGNOSIS — E119 Type 2 diabetes mellitus without complications: Secondary | ICD-10-CM | POA: Diagnosis not present

## 2018-01-13 NOTE — Progress Notes (Signed)
Subjective: 77 y.o. returns the office today for painful, elongated, thickened toenails which she cannot trim herself. Denies any redness or drainage around the nails.  She also has secondary concerns today that over Thanksgiving she did fall off a curb and she felt her ankle turn.  She got pain to the back of her heel she points along the Achilles tendon.  She had no treatment and it was sore for a couple of days and the pain did improve however she still gets some minimal discomfort to the area.  She has not had any swelling or redness.  She is able to weight-bear and walk with minimal discomfort.  Denies any acute changes since last appointment and no new complaints today. Denies any systemic complaints such as fevers, chills, nausea, vomiting.   PCP: Isaias Sakai, DO  Objective: AAO 3, NAD DP/PT pulses palpable, CRT less than 3 seconds Nails hypertrophic, dystrophic, elongated, brittle, discolored 10. There is tenderness overlying the nails 1-5 bilaterally. There is no surrounding erythema or drainage along the nail sites. There is mild tenderness to the mid substance the Achilles tendon on the left side and there is mild thickening of the tendon.  Thompson test is negative and the Achilles tendon appears to be intact.  There is no pain on the flexor or extensor tendons.  There is no area of pinpoint bony tenderness or pain to vibratory sensation.  No significant edema there is no erythema or increase in warmth.  Minimal discomfort the sinus tarsi bilaterally.  There is no pain to the foot. No open lesions or pre-ulcerative lesions are identified. No other areas of tenderness bilateral lower extremities. No overlying edema, erythema, increased warmth. No pain with calf compression, swelling, warmth, erythema.  Assessment: Patient presents with symptomatic onychomycosis; Achilles tendinitis left side  Plan: -Treatment options including alternatives, risks, complications were  discussed -Nails sharply debrided 10 without complication/bleeding. -X-rays were obtained and reviewed.  No evidence of acute fracture was identified.  I do think that her symptoms are more from an Achilles tendinitis or partial tear.  At this point I would schedule II months since the injury.  Would continue with stretching, rehab exercises as well as ice to the area.  If symptoms are not improving despite home therapy order and start formal physical therapy or consider an MRI.  She agrees this plan.  I will see her back in 3 weeks for this otherwise if improving I will see her back in 3 months. -Discussed daily foot inspection. If there are any changes, to call the office immediately.  -Follow-up in 3 months or sooner if any problems are to arise. In the meantime, encouraged to call the office with any questions, concerns, changes symptoms.  Celesta Gentile, DPM

## 2018-01-16 ENCOUNTER — Other Ambulatory Visit (INDEPENDENT_AMBULATORY_CARE_PROVIDER_SITE_OTHER): Payer: Medicare Other

## 2018-01-16 DIAGNOSIS — I10 Essential (primary) hypertension: Secondary | ICD-10-CM

## 2018-01-16 DIAGNOSIS — R0609 Other forms of dyspnea: Secondary | ICD-10-CM

## 2018-01-17 LAB — BASIC METABOLIC PANEL
BUN/Creatinine Ratio: 22 (ref 12–28)
BUN: 24 mg/dL (ref 8–27)
CALCIUM: 10.3 mg/dL (ref 8.7–10.3)
CHLORIDE: 107 mmol/L — AB (ref 96–106)
CO2: 23 mmol/L (ref 20–29)
Creatinine, Ser: 1.09 mg/dL — ABNORMAL HIGH (ref 0.57–1.00)
GFR calc Af Amer: 57 mL/min/{1.73_m2} — ABNORMAL LOW (ref >59)
GFR calc non Af Amer: 49 mL/min/{1.73_m2} — ABNORMAL LOW (ref >59)
GLUCOSE: 176 mg/dL — AB (ref 65–99)
Potassium: 4.3 mmol/L (ref 3.5–5.2)
Sodium: 145 mmol/L — ABNORMAL HIGH (ref 134–144)

## 2018-01-23 DIAGNOSIS — I10 Essential (primary) hypertension: Secondary | ICD-10-CM | POA: Diagnosis not present

## 2018-01-23 DIAGNOSIS — G4733 Obstructive sleep apnea (adult) (pediatric): Secondary | ICD-10-CM | POA: Diagnosis not present

## 2018-01-23 DIAGNOSIS — N182 Chronic kidney disease, stage 2 (mild): Secondary | ICD-10-CM | POA: Diagnosis not present

## 2018-01-23 DIAGNOSIS — E1149 Type 2 diabetes mellitus with other diabetic neurological complication: Secondary | ICD-10-CM | POA: Diagnosis not present

## 2018-01-23 DIAGNOSIS — N189 Chronic kidney disease, unspecified: Secondary | ICD-10-CM | POA: Diagnosis not present

## 2018-01-23 DIAGNOSIS — Z6836 Body mass index (BMI) 36.0-36.9, adult: Secondary | ICD-10-CM | POA: Diagnosis not present

## 2018-01-23 DIAGNOSIS — Z1331 Encounter for screening for depression: Secondary | ICD-10-CM | POA: Diagnosis not present

## 2018-02-01 ENCOUNTER — Ambulatory Visit: Payer: Medicare Other | Admitting: Podiatry

## 2018-02-05 ENCOUNTER — Encounter: Payer: Self-pay | Admitting: Podiatry

## 2018-02-05 ENCOUNTER — Ambulatory Visit (INDEPENDENT_AMBULATORY_CARE_PROVIDER_SITE_OTHER): Payer: Medicare Other | Admitting: Podiatry

## 2018-02-05 DIAGNOSIS — M7662 Achilles tendinitis, left leg: Secondary | ICD-10-CM

## 2018-02-06 NOTE — Progress Notes (Signed)
Subjective: Laura Garner presents the office today for follow-up evaluation of left foot Achilles tendinitis, ankle pain.  Laura Garner states that since last appointment Laura Garner has been doing stretching exercises on a regular basis.  Overall Laura Garner is doing much better Laura Garner is currently not experiencing any pain.  Laura Garner did have a little bit of sharp pain to the outside aspect of her ankle over the weekend but this quickly resolved.  No recent injury or trauma Laura Garner denies any increase in swelling or redness.  Laura Garner has no other concerns today. Denies any systemic complaints such as fevers, chills, nausea, vomiting. No acute changes since last appointment, and no other complaints at this time.   Objective: AAO x3, NAD DP/PT pulses palpable bilaterally, CRT less than 3 seconds At this time there is no tenderness palpation along the course of the Achilles tendon and Thompson test is negative no defect is noted.  There is no area of tenderness identified to the left foot or ankle.  There is no pain on sinus tarsi.  There is no overlying edema, erythema, increase in warmth.  Laura Garner states that Laura Garner is doing very well having no issues today. No open lesions or pre-ulcerative lesions.  No pain with calf compression, swelling, warmth, erythema  Assessment: Resolved left Achilles tendinitis  Plan: -All treatment options discussed with the patient including all alternatives, risks, complications.  -Although her pain is improved I want her to continue with stretching, icing exercises daily as well as supportive shoes.  If symptoms recur to call the office otherwise I will see her back as scheduled for her routine care appointment.  Laura Garner agrees to this plan and is happy her pain has resolved. -Patient encouraged to call the office with any questions, concerns, change in symptoms.   Trula Slade DPM

## 2018-02-14 ENCOUNTER — Ambulatory Visit (INDEPENDENT_AMBULATORY_CARE_PROVIDER_SITE_OTHER): Payer: Medicare Other | Admitting: Nurse Practitioner

## 2018-02-14 ENCOUNTER — Encounter: Payer: Self-pay | Admitting: Nurse Practitioner

## 2018-02-14 VITALS — BP 164/70 | HR 67 | Ht 61.5 in | Wt 201.2 lb

## 2018-02-14 DIAGNOSIS — I10 Essential (primary) hypertension: Secondary | ICD-10-CM

## 2018-02-14 DIAGNOSIS — G4733 Obstructive sleep apnea (adult) (pediatric): Secondary | ICD-10-CM | POA: Diagnosis not present

## 2018-02-14 DIAGNOSIS — R0609 Other forms of dyspnea: Secondary | ICD-10-CM

## 2018-02-14 DIAGNOSIS — N182 Chronic kidney disease, stage 2 (mild): Secondary | ICD-10-CM

## 2018-02-14 NOTE — Patient Instructions (Signed)
Medication Instructions:  Your physician recommends that you continue on your current medications as directed. Please refer to the Current Medication list given to you today.   Labwork: none  Testing/Procedures: none  Follow-Up: Your physician wants you to follow-up in: 6 months with Dr. End.  You will receive a reminder letter in the mail two months in advance. If you don't receive a letter, please call our office to schedule the follow-up appointment.   Any Other Special Instructions Will Be Listed Below (If Applicable).     If you need a refill on your cardiac medications before your next appointment, please call your pharmacy.   

## 2018-02-14 NOTE — Progress Notes (Signed)
Office Visit    Patient Name: Laura Garner Date of Encounter: 02/14/2018  Primary Care Provider:  Isaias Sakai, DO Primary Cardiologist:  Nelva Bush, MD  Chief Complaint    77 year old female with a history of difficult to control hypertension, hyperlipidemia, type 2 diabetes mellitus, stage II chronic kidney disease, obesity, and GERD, who presents for follow-up.  Past Medical History    Past Medical History:  Diagnosis Date  . Anemia   . Anxiety    no meds  . Arthritis    neck - otc med prn  . Chest pain    a. 10/2017 MV: EF 55-65%, no ischemia, infarct.  . Diabetes mellitus    type 2  . Diastolic dysfunction    a. 10/2017 Echo: EF 55-60%, no rwma, Gr1 DD, mod dil LA, PASP 57mmHg.  Marland Kitchen GERD (gastroesophageal reflux disease)   . Heart palpitations    hx - caused by medication  . History of kidney stones    multiple kidney stones -passed some stones and surgery x 3   . Hyperlipidemia   . Hypertension    a. Difficult to control-->10/2017 Renal Duplex: < 60% bilat RAS.  Marland Kitchen PAH (pulmonary artery hypertension) ([place])    a. 10/2017 Echo: PASP 25mmHg.  Marland Kitchen RBBB   . Reflux   . Seasonal allergies   . SVD (spontaneous vaginal delivery)    x 3   Past Surgical History:  Procedure Laterality Date  . HYSTEROSCOPY W/D&C N/A 03/22/2017   Procedure: DILATATION AND CURETTAGE /HYSTEROSCOPY;  Surgeon: Newton Pigg, MD;  Location: [facility];  Service: Gynecology;  Laterality: N/A;  . lithrotripsy     surgery x 3  . right foot surgery     repair tendon  . SHOULDER SURGERY     x 2 - right and left    Allergies  Allergies  Allergen Reactions  . Amlodipine     Swelling/'foggy sensation'  . Avelox [Moxifloxacin Hcl]     Weakness and felt bad  . Doxazosin     Explosive diarrhea   . Hydralazine     anxiety  . Iodinated Diagnostic Agents   . Iodine   . Trimethoprim Other (See Comments) and Diarrhea    Extreme diarrhea     History of Present Illness      77 year old female with the above complex past medical history including difficult to control hypertension, hyperlipidemia, diabetes, stage II chronic kidney disease, obesity, and GERD.  She has had multiple medication intolerances in the setting of trying to get her blood pressure down including amlodipine, hydralazine, HCTZ, doxazosin, and lisinopril.  In November 2018, she underwent workup for secondary hypertension was found to have normal serum aldosterone/plasma renin activity, no significant renal artery stenosis on renal duplex, and normal LV function on echocardiogram.  Grade 1 diastolic dysfunction and pulmonary arterial hypertension with a PAS P of 45 mmHg was noted.  Stress testing was undertaken and was nonischemic.  In December 2018, I added chlorthalidone 12.5 mg daily and when I saw her back in January, home blood pressure recordings were trending in the 140s with occasional 150s.  She was having some diarrhea, which had started shortly after starting chlorthalidone and potassium.  She felt potassium was causing the issue and we stopped it.  Follow-up basic metabolic panel was stable.  She says even more recently, she had another bmet with her primary care provider, and potassium remained stable.  Since stopping potassium, diarrhea has resolved.  She  continues to tolerate chlorthalidone well and blood pressures at home are now trending in the 130s with rare 140s and also occasional 120s.  She continues to feel as though the chlorthalidone has significantly improved her chronic lower extremity swelling and dyspnea.  She denies chest pain, palpitations, PND, orthopnea, dizziness, syncope, or early satiety.  Home Medications    Prior to Admission medications   Medication Sig Start Date End Date Taking? Authorizing Provider  aspirin EC 81 MG tablet Take 81 mg by mouth daily.   Yes [provider]  Calcium Carbonate-Vit D-Min (CALTRATE PLUS PO) Take 1 tablet by mouth daily.    Yes  [provider]  carvedilol (COREG) 12.5 MG tablet Take 12.5 mg by mouth 2 (two) times daily with a meal.   Yes [provider]  chlorthalidone (HYGROTON) 25 MG tablet Take 0.5 tablets (12.5 mg total) by mouth daily. 12/05/17 03/05/18 Yes Theora Gianotti, NP  cloNIDine (CATAPRES - DOSED IN MG/24 HR) 0.1 mg/24hr patch  09/13/17  Yes [provider]  ferrous sulfate 325 (65 FE) MG tablet Take 325 mg by mouth daily with breakfast.   Yes [provider]  fluticasone (FLONASE) 50 MCG/ACT nasal spray Place 2 sprays into both nostrils daily.   Yes [provider]  loratadine (CLARITIN) 10 MG tablet Take 10 mg by mouth daily.   Yes [provider]  losartan (COZAAR) 100 MG tablet Take 100 mg by mouth at bedtime.    Yes [provider]  metFORMIN (GLUCOPHAGE) 1000 MG tablet Take 1,000 mg by mouth 2 (two) times daily with a meal.   Yes [provider]  Multiple Vitamin (MULTIVITAMIN) capsule Take 1 capsule by mouth daily.    Yes [provider]  Omega-3 Fatty Acids (FISH OIL) 1000 MG CPDR Take 1 capsule by mouth 2 (two) times daily.    Yes [provider]  pantoprazole (PROTONIX) 40 MG tablet Take 40 mg by mouth daily as needed.  05/31/15  Yes [provider]  Polyethyl Glycol-Propyl Glycol (SYSTANE ULTRA OP) Place 1 drop into both eyes daily as needed (dry eyes).   Yes [provider]  pravastatin (PRAVACHOL) 20 MG tablet Take 20 mg by mouth at bedtime.    Yes [provider]  vitamin B-12 (CYANOCOBALAMIN) 1000 MCG tablet Take 1,000 mcg by mouth daily.   Yes [provider]    Review of Systems    She continues to do well from a cardiac standpoint and is really pleased with her blood pressure management.  She denies chest pain, dyspnea, palpitations, PND, orthopnea, dizziness, syncope, or early satiety.  She is chronic, mild lower extremity swelling which is improved  overall..  All other systems reviewed and are otherwise negative except as noted above.  Physical Exam    VS:  BP (!) 164/70 (BP Location: Left Arm, Patient Position: Sitting, Cuff Size: Large)   Pulse 67   Ht 5' 1.5" (1.562 m)   Wt 201 lb 4 oz (91.3 kg)   BMI 37.41 kg/m  , BMI Body mass index is 37.41 kg/m. GEN: Well nourished, well developed, in no acute distress.  HEENT: normal.  Neck: Supple, no JVD, carotid bruits, or masses. Cardiac: RRR, no murmurs, rubs, or gallops. No clubbing, cyanosis, trace bilateral ankle edema.  Radials/DP/PT 2+ and equal bilaterally.  Respiratory:  Respirations regular and unlabored, clear to auscultation bilaterally. GI: Soft, nontender, nondistended, BS + x 4. MS: no deformity or atrophy. Skin: warm and  dry, no rash. Neuro:  Strength and sensation are intact. Psych: Normal affect.  Accessory Clinical Findings    Lab Results  Component Value Date   CREATININE 1.09 (H) 01/16/2018   BUN 24 01/16/2018   NA 145 (H) 01/16/2018   K 4.3 01/16/2018   CL 107 (H) 01/16/2018   CO2 23 01/16/2018   **BMET off of KCl  Assessment & Plan    1.  Essential hypertension: Blood pressure has been under much better control at home, typically running in the 130s with occasional rises to the 140s.  She now also has occasional blood pressures in the 1 teens and 120s.  Pressure is elevated in clinic today.  She checks her blood pressure daily at home and considering how well things are going at home, I will not make any changes today.  She is tolerating chlorthalidone well and it is sufficiently managing her lower extremity edema without causing incontinence, which hydrochlorothiazide did 2.  Chronic dyspnea on exertion:.  She is only on 12.5 mg daily.  She otherwise remains on carvedilol, clonidine patch, and losartan.  2.  Chronic dyspnea on exertion: This has improved in the setting of chlorthalidone therapy and improved blood pressure management overall.  She had  normal LV function by echo with nonischemic stress testing in November 2018.  3.  Obstructive sleep apnea: She has now been on CPAP for 2 weeks and is now tolerating it well.  She does feels that she has more energy during the day.  She has follow-up with pulmonology in a few weeks.  4.  GERD: Stable on PPI.  No recurrence of chest discomfort.  5.  CKD II-III:  Creat stable on chlorthalidone 01/16/18.  6.  Disposition: Follow-up in clinic in 6 months or sooner if necessary.  Murray Hodgkins, NP 02/14/2018, 2:06 PM

## 2018-03-06 DIAGNOSIS — N200 Calculus of kidney: Secondary | ICD-10-CM | POA: Diagnosis not present

## 2018-03-14 ENCOUNTER — Ambulatory Visit (INDEPENDENT_AMBULATORY_CARE_PROVIDER_SITE_OTHER): Payer: Medicare Other | Admitting: Pulmonary Disease

## 2018-03-14 ENCOUNTER — Encounter: Payer: Self-pay | Admitting: Pulmonary Disease

## 2018-03-14 VITALS — BP 134/74 | HR 76 | Ht 61.5 in | Wt 201.0 lb

## 2018-03-14 DIAGNOSIS — E668 Other obesity: Secondary | ICD-10-CM

## 2018-03-14 DIAGNOSIS — Z9989 Dependence on other enabling machines and devices: Secondary | ICD-10-CM | POA: Diagnosis not present

## 2018-03-14 DIAGNOSIS — G4733 Obstructive sleep apnea (adult) (pediatric): Secondary | ICD-10-CM

## 2018-03-14 NOTE — Patient Instructions (Signed)
Continue CPAP with sleep We will change the AutoSet settings to 10-20 cm H2O Continue your efforts at weight loss Suggest a vitamin D supplement at 2000-5000 IU/day Follow-up in 6 months or sooner as needed for any sleep-related problems

## 2018-03-16 NOTE — Progress Notes (Signed)
PULMONARY OFFICE FOLLOW UP NOTE  Requesting MD/Service: Ignacia Bayley, PA Date of initial consultation: 12/19/17 Reason for consultation: Severe hypertension, rule out OSA  PT PROFILE: 77 y.o. female never smoker referred to rule out OSA due to refractory hypertension  DATA: PSG 01/02/18: mod-severe OSA. RDI 37.4. Autoset 5-20 recommended Compliance 02/25-03/26/19: 30/30 nights.   SUBJ:  Since last visit OSA diagnosed and CPAP initiated. She is tolerating well, using consistently and benefiting from it. Her daytime hypersomnolence is improved. BP control is improved. She has lost 7# since Jan. Denies CP, fever, purulent sputum, hemoptysis, LE edema and calf tenderness.    Vitals:   03/14/18 0959 03/14/18 1004  BP:  134/74  Pulse:  76  SpO2:  99%  Weight: 201 lb (91.2 kg)   Height: 5' 1.5" (1.562 m)       EXAM:  Gen: NAD HEENT: NCAT, sclera white Neck: No JVD Lungs: breath sounds full, no wheezes or other adventitious sounds Cardiovascular: RRR, no murmurs Abdomen: Soft, nontender, normal BS Ext: without clubbing, cyanosis, edema Neuro: grossly intact Skin: Limited exam, no lesions noted   DATA:   BMP Latest Ref Rng & Units 01/16/2018 12/14/2017 10/24/2017  Glucose 65 - 99 mg/dL 176(H) 143(H) 123(H)  BUN 8 - 27 mg/dL 24 19 17   Creatinine 0.57 - 1.00 mg/dL 1.09(H) 0.81 0.78  BUN/Creat Ratio 12 - 28 22 23 22   Sodium 134 - 144 mmol/L 145(H) 143 143  Potassium 3.5 - 5.2 mmol/L 4.3 4.5 4.2  Chloride 96 - 106 mmol/L 107(H) 106 103  CO2 20 - 29 mmol/L 23 22 25   Calcium 8.7 - 10.3 mg/dL 10.3 9.6 9.7    CBC Latest Ref Rng & Units 03/21/2017 06/26/2007  WBC 4.0 - 10.5 K/uL 7.1 -  Hemoglobin 12.0 - 15.0 g/dL 11.4(L) 10.2(L)  Hematocrit 36.0 - 46.0 % 33.8(L) -  Platelets 150 - 400 K/uL 214 -    CXR: No recent films  IMPRESSION:   1) Refractory hypertension - improved control with Rx of OSA 2) Mod-severe OSA on AutoSet CPAP - tolerating well, compliant with it, benefiting  from it 3) Moderate obesity with excellent wt loss efforts  PLAN:  -Continue CPAP with sleep -We will change the AutoSet settings to 10-20 cm H2O -Continue your efforts at weight loss -Suggest a vitamin D supplement at 2000-5000 IU/day as it is highly likely that she is vitamin D deficient and there are some studies suggesting a correlation between D deficiency and OSA -Follow-up in 6 months or sooner as needed for any sleep-related problems   Merton Border, MD PCCM service Mobile [phone] Pager [phone] 03/16/2018 10:49 AM

## 2018-04-11 ENCOUNTER — Ambulatory Visit (INDEPENDENT_AMBULATORY_CARE_PROVIDER_SITE_OTHER): Payer: Medicare Other | Admitting: Podiatry

## 2018-04-11 ENCOUNTER — Encounter: Payer: Self-pay | Admitting: Podiatry

## 2018-04-11 DIAGNOSIS — M79675 Pain in left toe(s): Secondary | ICD-10-CM

## 2018-04-11 DIAGNOSIS — B351 Tinea unguium: Secondary | ICD-10-CM

## 2018-04-11 DIAGNOSIS — Q828 Other specified congenital malformations of skin: Secondary | ICD-10-CM | POA: Diagnosis not present

## 2018-04-11 DIAGNOSIS — M79674 Pain in right toe(s): Secondary | ICD-10-CM

## 2018-04-11 NOTE — Progress Notes (Signed)
Subjective: 77 y.o. returns the office today for painful, elongated, thickened toenails which she cannot trim herself. Denies any redness or drainage around the nails.  She is otherwise she is doing well.  The brace and supportive she is been helping quite a bit.  She does not feel that she needs an injection today.  Denies any acute changes since last appointment and no new complaints today. Denies any systemic complaints such as fevers, chills, nausea, vomiting.   PCP: Isaias Sakai, DO  Objective: AAO 3, NAD DP/PT pulses palpable, CRT less than 3 seconds Nails hypertrophic, dystrophic, elongated, brittle, discolored 10. There is tenderness overlying the nails 1-5 bilaterally. There is no surrounding erythema or drainage along the nail sites. Significant forefoot is present.  There is a hyperkeratotic lesion associated on the right medial midfoot.  No underlying ulceration, drainage or any signs of infection.  No other areas of tenderness bilateral lower extremities. No overlying edema, erythema, increased warmth. No pain with calf compression, swelling, warmth, erythema.  Assessment: Patient presents with symptomatic onychomycosis  Plan: -Treatment options including alternatives, risks, complications were discussed -Nails sharply debrided 10 without complication/bleeding. -I lightly debrided the hyperkeratotic lesion on the right foot without any complications or bleeding.  Continue offloading. -Continue with the brace. -Discussed daily foot inspection. If there are any changes, to call the office immediately.  -Follow-up in 3 months or sooner if any problems are to arise. In the meantime, encouraged to call the office with any questions, concerns, changes symptoms.  Celesta Gentile, DPM

## 2018-04-23 DIAGNOSIS — G4733 Obstructive sleep apnea (adult) (pediatric): Secondary | ICD-10-CM | POA: Diagnosis not present

## 2018-04-23 DIAGNOSIS — E1149 Type 2 diabetes mellitus with other diabetic neurological complication: Secondary | ICD-10-CM | POA: Diagnosis not present

## 2018-04-23 DIAGNOSIS — I10 Essential (primary) hypertension: Secondary | ICD-10-CM | POA: Diagnosis not present

## 2018-05-08 DIAGNOSIS — Z1231 Encounter for screening mammogram for malignant neoplasm of breast: Secondary | ICD-10-CM | POA: Diagnosis not present

## 2018-05-08 DIAGNOSIS — Z01419 Encounter for gynecological examination (general) (routine) without abnormal findings: Secondary | ICD-10-CM | POA: Diagnosis not present

## 2018-05-08 DIAGNOSIS — N814 Uterovaginal prolapse, unspecified: Secondary | ICD-10-CM | POA: Diagnosis not present

## 2018-05-14 ENCOUNTER — Ambulatory Visit (INDEPENDENT_AMBULATORY_CARE_PROVIDER_SITE_OTHER): Payer: Medicare Other

## 2018-05-14 ENCOUNTER — Ambulatory Visit (INDEPENDENT_AMBULATORY_CARE_PROVIDER_SITE_OTHER): Payer: Medicare Other | Admitting: Podiatry

## 2018-05-14 ENCOUNTER — Encounter: Payer: Self-pay | Admitting: Podiatry

## 2018-05-14 DIAGNOSIS — S99921A Unspecified injury of right foot, initial encounter: Secondary | ICD-10-CM

## 2018-05-14 DIAGNOSIS — R42 Dizziness and giddiness: Secondary | ICD-10-CM | POA: Diagnosis not present

## 2018-05-14 DIAGNOSIS — M779 Enthesopathy, unspecified: Secondary | ICD-10-CM | POA: Diagnosis not present

## 2018-05-14 DIAGNOSIS — E1149 Type 2 diabetes mellitus with other diabetic neurological complication: Secondary | ICD-10-CM | POA: Diagnosis not present

## 2018-05-14 DIAGNOSIS — R2689 Other abnormalities of gait and mobility: Secondary | ICD-10-CM | POA: Diagnosis not present

## 2018-05-14 DIAGNOSIS — I10 Essential (primary) hypertension: Secondary | ICD-10-CM | POA: Diagnosis not present

## 2018-05-14 MED ORDER — TRIAMCINOLONE ACETONIDE 10 MG/ML IJ SUSP
10.0000 mg | Freq: Once | INTRAMUSCULAR | Status: AC
  Administered 2018-05-14: 10 mg

## 2018-05-15 DIAGNOSIS — M779 Enthesopathy, unspecified: Secondary | ICD-10-CM | POA: Insufficient documentation

## 2018-05-15 NOTE — Progress Notes (Signed)
Subjective: 76 year old female presents the office today for concerns of right foot pain she points to the outside aspect of her foot.  She states that she did not fall or twist her foot but she noticed on Tuesday morning she started to have some pain that night she is having quite a bit of pain.  When she woke up on Wednesday when she was still having pain the pain has subsided since then but does continue.  No increase in swelling and no redness.  No recent treatment. Denies any systemic complaints such as fevers, chills, nausea, vomiting. No acute changes since last appointment, and no other complaints at this time.   Objective: AAO x3, NAD DP/PT pulses palpable bilaterally, CRT less than 3 seconds There is tenderness palpation of the course of the peroneal tendon posterior to the lateral malleolus as well as inferior just proximal to the fifth metatarsal base.  Overall the peroneal tendon appears to be intact.  There is tenderness to the lateral aspect of the foot on the sinus tarsi.  There is no significant increase in swelling there is no erythema or increase in warmth.  No open lesions or pre-ulcerative lesions.  No pain with calf compression, swelling, warmth, erythema  Assessment: Right ankle pain, tendinitis/capsulitis  Plan: -All treatment options discussed with the patient including all alternatives, risks, complications.  -X-rays were obtained reviewed.  There is no evidence of acute fracture or stress fracture.  Flatfoot deformity is present as well as arthritic changes present. -Continue ice the area as well as anti-inflammatories daily.  Dispensed a Tri-Lock ankle brace.  Discussed a steroid injection for the sinus tarsi and she wished to proceed.  See procedure note below. -For the next couple weeks if symptoms are not resolving her office if there is any worsening otherwise I will see her back as scheduled for her routine care. -Patient encouraged to call the office with any  questions, concerns, change in symptoms.    Procedure: Injection intermediate joint Discussed alternatives, risks, complications and verbal consent was obtained.  Location: Right sinus tarsi, lateral approach  Skin Prep: Alcohol . Injectate: 0.5cc 0.5% marcaine plain, 0.5 cc 2% lidocaine plain and, 1 cc kenalog 10. Disposition: Patient tolerated procedure well. Injection site dressed with a band-aid.  Post-injection care was discussed and return precautions discussed.

## 2018-05-17 ENCOUNTER — Other Ambulatory Visit: Payer: Self-pay | Admitting: Obstetrics and Gynecology

## 2018-05-17 DIAGNOSIS — R928 Other abnormal and inconclusive findings on diagnostic imaging of breast: Secondary | ICD-10-CM

## 2018-05-20 DIAGNOSIS — M4304 Spondylolysis, thoracic region: Secondary | ICD-10-CM | POA: Diagnosis not present

## 2018-05-20 DIAGNOSIS — M4307 Spondylolysis, lumbosacral region: Secondary | ICD-10-CM | POA: Diagnosis not present

## 2018-05-20 DIAGNOSIS — M9903 Segmental and somatic dysfunction of lumbar region: Secondary | ICD-10-CM | POA: Diagnosis not present

## 2018-05-20 DIAGNOSIS — M9902 Segmental and somatic dysfunction of thoracic region: Secondary | ICD-10-CM | POA: Diagnosis not present

## 2018-05-20 DIAGNOSIS — M4301 Spondylolysis, occipito-atlanto-axial region: Secondary | ICD-10-CM | POA: Diagnosis not present

## 2018-05-20 DIAGNOSIS — M9901 Segmental and somatic dysfunction of cervical region: Secondary | ICD-10-CM | POA: Diagnosis not present

## 2018-05-21 ENCOUNTER — Other Ambulatory Visit: Payer: Medicare Other

## 2018-05-21 DIAGNOSIS — E119 Type 2 diabetes mellitus without complications: Secondary | ICD-10-CM | POA: Diagnosis not present

## 2018-05-21 DIAGNOSIS — H2513 Age-related nuclear cataract, bilateral: Secondary | ICD-10-CM | POA: Diagnosis not present

## 2018-05-21 DIAGNOSIS — H524 Presbyopia: Secondary | ICD-10-CM | POA: Diagnosis not present

## 2018-05-21 DIAGNOSIS — H04123 Dry eye syndrome of bilateral lacrimal glands: Secondary | ICD-10-CM | POA: Diagnosis not present

## 2018-05-23 ENCOUNTER — Ambulatory Visit
Admission: RE | Admit: 2018-05-23 | Discharge: 2018-05-23 | Disposition: A | Discharge status: Home / Self Care | Payer: Medicare Other | Source: Ambulatory Visit | Attending: Obstetrics and Gynecology | Admitting: Obstetrics and Gynecology

## 2018-05-23 ENCOUNTER — Other Ambulatory Visit: Payer: Self-pay | Admitting: Obstetrics and Gynecology

## 2018-05-23 DIAGNOSIS — R928 Other abnormal and inconclusive findings on diagnostic imaging of breast: Secondary | ICD-10-CM

## 2018-05-23 DIAGNOSIS — N651 Disproportion of reconstructed breast: Secondary | ICD-10-CM | POA: Diagnosis not present

## 2018-05-23 DIAGNOSIS — N6489 Other specified disorders of breast: Secondary | ICD-10-CM

## 2018-05-29 ENCOUNTER — Ambulatory Visit
Admission: RE | Admit: 2018-05-29 | Discharge: 2018-05-29 | Disposition: A | Discharge status: Home / Self Care | Payer: Medicare Other | Source: Ambulatory Visit | Attending: Obstetrics and Gynecology | Admitting: Obstetrics and Gynecology

## 2018-05-29 ENCOUNTER — Other Ambulatory Visit: Payer: Self-pay | Admitting: Obstetrics and Gynecology

## 2018-05-29 DIAGNOSIS — N6489 Other specified disorders of breast: Secondary | ICD-10-CM

## 2018-05-29 DIAGNOSIS — N6321 Unspecified lump in the left breast, upper outer quadrant: Secondary | ICD-10-CM | POA: Diagnosis not present

## 2018-06-06 DIAGNOSIS — M9902 Segmental and somatic dysfunction of thoracic region: Secondary | ICD-10-CM | POA: Diagnosis not present

## 2018-06-06 DIAGNOSIS — M4301 Spondylolysis, occipito-atlanto-axial region: Secondary | ICD-10-CM | POA: Diagnosis not present

## 2018-06-06 DIAGNOSIS — M9901 Segmental and somatic dysfunction of cervical region: Secondary | ICD-10-CM | POA: Diagnosis not present

## 2018-06-06 DIAGNOSIS — M9903 Segmental and somatic dysfunction of lumbar region: Secondary | ICD-10-CM | POA: Diagnosis not present

## 2018-06-06 DIAGNOSIS — M4304 Spondylolysis, thoracic region: Secondary | ICD-10-CM | POA: Diagnosis not present

## 2018-06-06 DIAGNOSIS — M4307 Spondylolysis, lumbosacral region: Secondary | ICD-10-CM | POA: Diagnosis not present

## 2018-06-10 DIAGNOSIS — Z6835 Body mass index (BMI) 35.0-35.9, adult: Secondary | ICD-10-CM | POA: Diagnosis not present

## 2018-06-10 DIAGNOSIS — Z1339 Encounter for screening examination for other mental health and behavioral disorders: Secondary | ICD-10-CM | POA: Diagnosis not present

## 2018-06-10 DIAGNOSIS — R2689 Other abnormalities of gait and mobility: Secondary | ICD-10-CM | POA: Diagnosis not present

## 2018-06-11 ENCOUNTER — Ambulatory Visit (INDEPENDENT_AMBULATORY_CARE_PROVIDER_SITE_OTHER): Payer: Medicare Other | Admitting: Podiatry

## 2018-06-11 DIAGNOSIS — M79675 Pain in left toe(s): Secondary | ICD-10-CM

## 2018-06-11 DIAGNOSIS — M79674 Pain in right toe(s): Secondary | ICD-10-CM

## 2018-06-11 DIAGNOSIS — B351 Tinea unguium: Secondary | ICD-10-CM

## 2018-06-12 DIAGNOSIS — R2689 Other abnormalities of gait and mobility: Secondary | ICD-10-CM | POA: Diagnosis not present

## 2018-06-12 DIAGNOSIS — M6281 Muscle weakness (generalized): Secondary | ICD-10-CM | POA: Diagnosis not present

## 2018-06-12 NOTE — Progress Notes (Signed)
Subjective: 77 y.o. returns the office today for painful, elongated, thickened toenails which she cannot trim herself. Denies any redness or drainage around the nails.  She states the injection is done well since I last saw her and she has no other concerns and repeated been doing well.  She states they have been  "holding up". Denies any acute changes since last appointment and no new complaints today. Denies any systemic complaints such as fevers, chills, nausea, vomiting.   PCP: Isaias Sakai, DO   Objective: AAO 3, NAD DP/PT pulses palpable, CRT less than 3 seconds Nails hypertrophic, dystrophic, elongated, brittle, discolored 10. There is tenderness overlying the nails 1-5 bilaterally. There is no surrounding erythema or drainage along the nail sites. No open lesions or pre-ulcerative lesions are identified. No other areas of tenderness bilateral lower extremities. No overlying edema, erythema, increased warmth. No pain with calf compression, swelling, warmth, erythema.  Assessment: Patient presents with symptomatic onychomycosis  Plan: -Treatment options including alternatives, risks, complications were discussed -Nails sharply debrided 10 without complication/bleeding. -Discussed daily foot inspection. If there are any changes, to call the office immediately.  -Follow-up in 9 weeks  or sooner if any problems are to arise. In the meantime, encouraged to call the office with any questions, concerns, changes symptoms.  Celesta Gentile, DPM

## 2018-06-13 ENCOUNTER — Other Ambulatory Visit: Payer: Self-pay | Admitting: General Surgery

## 2018-06-13 ENCOUNTER — Ambulatory Visit: Payer: Self-pay | Admitting: General Surgery

## 2018-06-13 DIAGNOSIS — N6022 Fibroadenosis of left breast: Secondary | ICD-10-CM

## 2018-06-14 DIAGNOSIS — M6281 Muscle weakness (generalized): Secondary | ICD-10-CM | POA: Diagnosis not present

## 2018-06-14 DIAGNOSIS — R2689 Other abnormalities of gait and mobility: Secondary | ICD-10-CM | POA: Diagnosis not present

## 2018-06-17 DIAGNOSIS — M6281 Muscle weakness (generalized): Secondary | ICD-10-CM | POA: Diagnosis not present

## 2018-06-17 DIAGNOSIS — R2689 Other abnormalities of gait and mobility: Secondary | ICD-10-CM | POA: Diagnosis not present

## 2018-06-19 DIAGNOSIS — M6281 Muscle weakness (generalized): Secondary | ICD-10-CM | POA: Diagnosis not present

## 2018-06-19 DIAGNOSIS — R2689 Other abnormalities of gait and mobility: Secondary | ICD-10-CM | POA: Diagnosis not present

## 2018-06-24 DIAGNOSIS — R2689 Other abnormalities of gait and mobility: Secondary | ICD-10-CM | POA: Diagnosis not present

## 2018-06-24 DIAGNOSIS — M6281 Muscle weakness (generalized): Secondary | ICD-10-CM | POA: Diagnosis not present

## 2018-06-27 DIAGNOSIS — M6281 Muscle weakness (generalized): Secondary | ICD-10-CM | POA: Diagnosis not present

## 2018-06-27 DIAGNOSIS — R2689 Other abnormalities of gait and mobility: Secondary | ICD-10-CM | POA: Diagnosis not present

## 2018-07-01 DIAGNOSIS — M6281 Muscle weakness (generalized): Secondary | ICD-10-CM | POA: Diagnosis not present

## 2018-07-01 DIAGNOSIS — R2689 Other abnormalities of gait and mobility: Secondary | ICD-10-CM | POA: Diagnosis not present

## 2018-07-02 ENCOUNTER — Encounter (HOSPITAL_BASED_OUTPATIENT_CLINIC_OR_DEPARTMENT_OTHER): Payer: Self-pay | Admitting: *Deleted

## 2018-07-02 ENCOUNTER — Other Ambulatory Visit: Payer: Self-pay

## 2018-07-04 DIAGNOSIS — M6281 Muscle weakness (generalized): Secondary | ICD-10-CM | POA: Diagnosis not present

## 2018-07-04 DIAGNOSIS — R2689 Other abnormalities of gait and mobility: Secondary | ICD-10-CM | POA: Diagnosis not present

## 2018-07-08 DIAGNOSIS — R2689 Other abnormalities of gait and mobility: Secondary | ICD-10-CM | POA: Diagnosis not present

## 2018-07-08 DIAGNOSIS — M6281 Muscle weakness (generalized): Secondary | ICD-10-CM | POA: Diagnosis not present

## 2018-07-10 ENCOUNTER — Encounter (HOSPITAL_BASED_OUTPATIENT_CLINIC_OR_DEPARTMENT_OTHER)
Admission: RE | Admit: 2018-07-10 | Discharge: 2018-07-10 | Disposition: A | Discharge status: Home / Self Care | Payer: Medicare Other | Source: Ambulatory Visit | Attending: General Surgery | Admitting: General Surgery

## 2018-07-10 DIAGNOSIS — Z01812 Encounter for preprocedural laboratory examination: Secondary | ICD-10-CM | POA: Diagnosis not present

## 2018-07-10 LAB — BASIC METABOLIC PANEL
ANION GAP: 10 (ref 5–15)
BUN: 15 mg/dL (ref 8–23)
CALCIUM: 10 mg/dL (ref 8.9–10.3)
CO2: 24 mmol/L (ref 22–32)
Chloride: 109 mmol/L (ref 98–111)
Creatinine, Ser: 0.85 mg/dL (ref 0.44–1.00)
GFR calc Af Amer: >60 mL/min (ref >60)
Glucose, Bld: 135 mg/dL — ABNORMAL HIGH (ref 70–99)
POTASSIUM: 4.5 mmol/L (ref 3.5–5.1)
Sodium: 143 mmol/L (ref 135–145)

## 2018-07-10 NOTE — Progress Notes (Signed)
Ensure pre surgery drink given with instructions to complete by 0645 dos, surgical soap given with instructions, pt verbalized understanding. 

## 2018-07-11 DIAGNOSIS — M6281 Muscle weakness (generalized): Secondary | ICD-10-CM | POA: Diagnosis not present

## 2018-07-11 DIAGNOSIS — R2689 Other abnormalities of gait and mobility: Secondary | ICD-10-CM | POA: Diagnosis not present

## 2018-07-15 DIAGNOSIS — M6281 Muscle weakness (generalized): Secondary | ICD-10-CM | POA: Diagnosis not present

## 2018-07-15 DIAGNOSIS — R2689 Other abnormalities of gait and mobility: Secondary | ICD-10-CM | POA: Diagnosis not present

## 2018-07-17 DIAGNOSIS — M6281 Muscle weakness (generalized): Secondary | ICD-10-CM | POA: Diagnosis not present

## 2018-07-17 DIAGNOSIS — R2689 Other abnormalities of gait and mobility: Secondary | ICD-10-CM | POA: Diagnosis not present

## 2018-07-18 ENCOUNTER — Ambulatory Visit
Admission: RE | Admit: 2018-07-18 | Discharge: 2018-07-18 | Disposition: A | Discharge status: Home / Self Care | Payer: Medicare Other | Source: Ambulatory Visit | Attending: General Surgery | Admitting: General Surgery

## 2018-07-18 DIAGNOSIS — N6022 Fibroadenosis of left breast: Secondary | ICD-10-CM

## 2018-07-18 DIAGNOSIS — R928 Other abnormal and inconclusive findings on diagnostic imaging of breast: Secondary | ICD-10-CM | POA: Diagnosis not present

## 2018-07-18 NOTE — Progress Notes (Signed)
I was called by Angie from Breast center regarding pt's elevated BP, lowest reading by manual cuff 182/80. She is scheduled to have seed implant today. She has a history of difficult to control HTN. She is scheduled for lumpectomy with seed localization 07/19/18 at the surgery center. I discussed cased with anesthesia Dr. Sabra Heck and he advised that while her pressure is poorly controlled it would be okay to proceed with seed implant. Would appreciate any input from provider who prescribes her AntiHTN medications. This was relayed to nurse at [facility].  Wynonia Musty [facility] Short Stay Center/Anesthesiology Phone [phone] 07/18/2018 1:46 PM

## 2018-07-19 ENCOUNTER — Encounter (HOSPITAL_BASED_OUTPATIENT_CLINIC_OR_DEPARTMENT_OTHER): Payer: Self-pay | Admitting: Certified Registered"

## 2018-07-19 ENCOUNTER — Ambulatory Visit (HOSPITAL_BASED_OUTPATIENT_CLINIC_OR_DEPARTMENT_OTHER): Payer: Medicare Other | Admitting: Physician Assistant

## 2018-07-19 ENCOUNTER — Encounter (HOSPITAL_BASED_OUTPATIENT_CLINIC_OR_DEPARTMENT_OTHER)
Admission: RE | Disposition: A | Discharge status: Home / Self Care | Payer: Self-pay | Source: Ambulatory Visit | Attending: General Surgery

## 2018-07-19 ENCOUNTER — Ambulatory Visit
Admission: RE | Admit: 2018-07-19 | Discharge: 2018-07-19 | Disposition: A | Discharge status: Home / Self Care | Payer: Medicare Other | Source: Ambulatory Visit | Attending: General Surgery | Admitting: General Surgery

## 2018-07-19 ENCOUNTER — Ambulatory Visit (HOSPITAL_BASED_OUTPATIENT_CLINIC_OR_DEPARTMENT_OTHER)
Admission: RE | Admit: 2018-07-19 | Discharge: 2018-07-19 | Disposition: A | Discharge status: Home / Self Care | Payer: Medicare Other | Source: Ambulatory Visit | Attending: General Surgery | Admitting: General Surgery

## 2018-07-19 ENCOUNTER — Other Ambulatory Visit: Payer: Self-pay

## 2018-07-19 DIAGNOSIS — Z6834 Body mass index (BMI) 34.0-34.9, adult: Secondary | ICD-10-CM | POA: Diagnosis not present

## 2018-07-19 DIAGNOSIS — Z7982 Long term (current) use of aspirin: Secondary | ICD-10-CM | POA: Diagnosis not present

## 2018-07-19 DIAGNOSIS — Z91041 Radiographic dye allergy status: Secondary | ICD-10-CM | POA: Insufficient documentation

## 2018-07-19 DIAGNOSIS — Z8249 Family history of ischemic heart disease and other diseases of the circulatory system: Secondary | ICD-10-CM | POA: Diagnosis not present

## 2018-07-19 DIAGNOSIS — K219 Gastro-esophageal reflux disease without esophagitis: Secondary | ICD-10-CM | POA: Insufficient documentation

## 2018-07-19 DIAGNOSIS — Z7984 Long term (current) use of oral hypoglycemic drugs: Secondary | ICD-10-CM | POA: Insufficient documentation

## 2018-07-19 DIAGNOSIS — E119 Type 2 diabetes mellitus without complications: Secondary | ICD-10-CM | POA: Insufficient documentation

## 2018-07-19 DIAGNOSIS — E669 Obesity, unspecified: Secondary | ICD-10-CM | POA: Diagnosis not present

## 2018-07-19 DIAGNOSIS — Z888 Allergy status to other drugs, medicaments and biological substances status: Secondary | ICD-10-CM | POA: Insufficient documentation

## 2018-07-19 DIAGNOSIS — N6489 Other specified disorders of breast: Secondary | ICD-10-CM | POA: Diagnosis not present

## 2018-07-19 DIAGNOSIS — N6022 Fibroadenosis of left breast: Secondary | ICD-10-CM | POA: Insufficient documentation

## 2018-07-19 DIAGNOSIS — M1990 Unspecified osteoarthritis, unspecified site: Secondary | ICD-10-CM | POA: Diagnosis not present

## 2018-07-19 DIAGNOSIS — R921 Mammographic calcification found on diagnostic imaging of breast: Secondary | ICD-10-CM | POA: Diagnosis not present

## 2018-07-19 DIAGNOSIS — I10 Essential (primary) hypertension: Secondary | ICD-10-CM | POA: Insufficient documentation

## 2018-07-19 DIAGNOSIS — E7800 Pure hypercholesterolemia, unspecified: Secondary | ICD-10-CM | POA: Insufficient documentation

## 2018-07-19 DIAGNOSIS — Z79899 Other long term (current) drug therapy: Secondary | ICD-10-CM | POA: Diagnosis not present

## 2018-07-19 DIAGNOSIS — F419 Anxiety disorder, unspecified: Secondary | ICD-10-CM | POA: Diagnosis not present

## 2018-07-19 DIAGNOSIS — Z853 Personal history of malignant neoplasm of breast: Secondary | ICD-10-CM | POA: Insufficient documentation

## 2018-07-19 DIAGNOSIS — D649 Anemia, unspecified: Secondary | ICD-10-CM | POA: Diagnosis not present

## 2018-07-19 DIAGNOSIS — N6012 Diffuse cystic mastopathy of left breast: Secondary | ICD-10-CM | POA: Diagnosis not present

## 2018-07-19 DIAGNOSIS — G4730 Sleep apnea, unspecified: Secondary | ICD-10-CM | POA: Insufficient documentation

## 2018-07-19 DIAGNOSIS — R928 Other abnormal and inconclusive findings on diagnostic imaging of breast: Secondary | ICD-10-CM | POA: Diagnosis not present

## 2018-07-19 HISTORY — DX: Sleep apnea, unspecified: G47.30

## 2018-07-19 HISTORY — DX: Unspecified lump in the left breast, unspecified quadrant: N63.20

## 2018-07-19 HISTORY — PX: BREAST LUMPECTOMY WITH RADIOACTIVE SEED LOCALIZATION: SHX6424

## 2018-07-19 LAB — GLUCOSE, CAPILLARY
Glucose-Capillary: 113 mg/dL — ABNORMAL HIGH (ref 70–99)
Glucose-Capillary: 126 mg/dL — ABNORMAL HIGH (ref 70–99)

## 2018-07-19 SURGERY — BREAST LUMPECTOMY WITH RADIOACTIVE SEED LOCALIZATION
Anesthesia: General | Site: Breast | Laterality: Left

## 2018-07-19 MED ORDER — FENTANYL CITRATE (PF) 100 MCG/2ML IJ SOLN
50.0000 ug | INTRAMUSCULAR | Status: AC | PRN
  Administered 2018-07-19: 50 ug via INTRAVENOUS
  Administered 2018-07-19 (×2): 25 ug via INTRAVENOUS

## 2018-07-19 MED ORDER — BUPIVACAINE-EPINEPHRINE (PF) 0.25% -1:200000 IJ SOLN
INTRAMUSCULAR | Status: AC
  Filled 2018-07-19: qty 30

## 2018-07-19 MED ORDER — FENTANYL CITRATE (PF) 100 MCG/2ML IJ SOLN: 25.0000 ug | INTRAMUSCULAR | Status: DC | PRN

## 2018-07-19 MED ORDER — LACTATED RINGERS IV SOLN
INTRAVENOUS | Status: DC
  Administered 2018-07-19: 10:00:00 via INTRAVENOUS

## 2018-07-19 MED ORDER — BUPIVACAINE-EPINEPHRINE (PF) 0.25% -1:200000 IJ SOLN
INTRAMUSCULAR | Status: AC
  Filled 2018-07-19: qty 60

## 2018-07-19 MED ORDER — DEXAMETHASONE SODIUM PHOSPHATE 4 MG/ML IJ SOLN
INTRAMUSCULAR | Status: DC | PRN
  Administered 2018-07-19: 4 mg via INTRAVENOUS

## 2018-07-19 MED ORDER — CEFAZOLIN SODIUM-DEXTROSE 2-4 GM/100ML-% IV SOLN
INTRAVENOUS | Status: AC
  Filled 2018-07-19: qty 100

## 2018-07-19 MED ORDER — ACETAMINOPHEN 500 MG PO TABS
1000.0000 mg | ORAL_TABLET | ORAL | Status: AC
  Administered 2018-07-19: 1000 mg via ORAL

## 2018-07-19 MED ORDER — SCOPOLAMINE 1 MG/3DAYS TD PT72: 1.0000 | MEDICATED_PATCH | Freq: Once | TRANSDERMAL | Status: DC | PRN

## 2018-07-19 MED ORDER — EPHEDRINE SULFATE 50 MG/ML IJ SOLN
INTRAMUSCULAR | Status: DC | PRN
  Administered 2018-07-19: 10 mg via INTRAVENOUS

## 2018-07-19 MED ORDER — FENTANYL CITRATE (PF) 100 MCG/2ML IJ SOLN
INTRAMUSCULAR | Status: AC
  Filled 2018-07-19: qty 2

## 2018-07-19 MED ORDER — CELECOXIB 200 MG PO CAPS
200.0000 mg | ORAL_CAPSULE | ORAL | Status: AC
  Administered 2018-07-19: 200 mg via ORAL

## 2018-07-19 MED ORDER — BUPIVACAINE HCL (PF) 0.25 % IJ SOLN
INTRAMUSCULAR | Status: AC
  Filled 2018-07-19: qty 90

## 2018-07-19 MED ORDER — GABAPENTIN 300 MG PO CAPS
300.0000 mg | ORAL_CAPSULE | ORAL | Status: AC
  Administered 2018-07-19: 300 mg via ORAL

## 2018-07-19 MED ORDER — ACETAMINOPHEN 500 MG PO TABS
ORAL_TABLET | ORAL | Status: AC
  Filled 2018-07-19: qty 2

## 2018-07-19 MED ORDER — HYDROCODONE-ACETAMINOPHEN 5-325 MG PO TABS: 1.0000 | ORAL_TABLET | Freq: Four times a day (QID) | ORAL | 0 refills | Status: DC | PRN

## 2018-07-19 MED ORDER — CELECOXIB 200 MG PO CAPS
ORAL_CAPSULE | ORAL | Status: AC
  Filled 2018-07-19: qty 1

## 2018-07-19 MED ORDER — GABAPENTIN 300 MG PO CAPS
ORAL_CAPSULE | ORAL | Status: AC
  Filled 2018-07-19: qty 1

## 2018-07-19 MED ORDER — MIDAZOLAM HCL 2 MG/2ML IJ SOLN: 1.0000 mg | INTRAMUSCULAR | Status: DC | PRN

## 2018-07-19 MED ORDER — BUPIVACAINE-EPINEPHRINE (PF) 0.25% -1:200000 IJ SOLN
INTRAMUSCULAR | Status: DC | PRN
  Administered 2018-07-19: 20 mL via PERINEURAL

## 2018-07-19 MED ORDER — CHLORHEXIDINE GLUCONATE CLOTH 2 % EX PADS: 6.0000 | MEDICATED_PAD | Freq: Once | CUTANEOUS | Status: DC

## 2018-07-19 MED ORDER — CEFAZOLIN SODIUM-DEXTROSE 2-4 GM/100ML-% IV SOLN
2.0000 g | INTRAVENOUS | Status: AC
  Administered 2018-07-19: 2 g via INTRAVENOUS

## 2018-07-19 MED ORDER — MEPERIDINE HCL 25 MG/ML IJ SOLN: 6.2500 mg | INTRAMUSCULAR | Status: DC | PRN

## 2018-07-19 MED ORDER — PROPOFOL 10 MG/ML IV BOLUS
INTRAVENOUS | Status: DC | PRN
  Administered 2018-07-19: 100 mg via INTRAVENOUS

## 2018-07-19 MED ORDER — ONDANSETRON HCL 4 MG/2ML IJ SOLN
INTRAMUSCULAR | Status: DC | PRN
  Administered 2018-07-19: 4 mg via INTRAVENOUS

## 2018-07-19 MED ORDER — LIDOCAINE HCL (CARDIAC) PF 100 MG/5ML IV SOSY
PREFILLED_SYRINGE | INTRAVENOUS | Status: DC | PRN
  Administered 2018-07-19: 60 mg via INTRAVENOUS

## 2018-07-19 SURGICAL SUPPLY — 42 items
APPLIER CLIP 9.375 MED OPEN (MISCELLANEOUS)
BLADE SURG 15 STRL LF DISP TIS (BLADE) ×1 IMPLANT
BLADE SURG 15 STRL SS (BLADE) ×2
CANISTER SUC SOCK COL 7IN (MISCELLANEOUS) IMPLANT
CANISTER SUCT 1200ML W/VALVE (MISCELLANEOUS) ×3 IMPLANT
CHLORAPREP W/TINT 26ML (MISCELLANEOUS) ×3 IMPLANT
CLIP APPLIE 9.375 MED OPEN (MISCELLANEOUS) IMPLANT
COVER BACK TABLE 60X90IN (DRAPES) ×3 IMPLANT
COVER MAYO STAND STRL (DRAPES) ×3 IMPLANT
COVER PROBE W GEL 5X96 (DRAPES) ×3 IMPLANT
DECANTER SPIKE VIAL GLASS SM (MISCELLANEOUS) ×3 IMPLANT
DERMABOND ADVANCED (GAUZE/BANDAGES/DRESSINGS) ×2
DERMABOND ADVANCED .7 DNX12 (GAUZE/BANDAGES/DRESSINGS) ×1 IMPLANT
DEVICE DUBIN W/COMP PLATE 8390 (MISCELLANEOUS) ×3 IMPLANT
DRAPE LAPAROSCOPIC ABDOMINAL (DRAPES) ×3 IMPLANT
DRAPE UTILITY XL STRL (DRAPES) ×3 IMPLANT
ELECT COATED BLADE 2.86 ST (ELECTRODE) ×3 IMPLANT
ELECT REM PT RETURN 9FT ADLT (ELECTROSURGICAL) ×3
ELECTRODE REM PT RTRN 9FT ADLT (ELECTROSURGICAL) ×1 IMPLANT
GLOVE BIO SURGEON STRL SZ 6.5 (GLOVE) ×4 IMPLANT
GLOVE BIO SURGEON STRL SZ7.5 (GLOVE) ×6 IMPLANT
GLOVE BIO SURGEONS STRL SZ 6.5 (GLOVE) ×2
GOWN STRL REUS W/ TWL LRG LVL3 (GOWN DISPOSABLE) ×2 IMPLANT
GOWN STRL REUS W/TWL LRG LVL3 (GOWN DISPOSABLE) ×4
ILLUMINATOR WAVEGUIDE N/F (MISCELLANEOUS) IMPLANT
KIT MARKER MARGIN INK (KITS) ×3 IMPLANT
LIGHT WAVEGUIDE WIDE FLAT (MISCELLANEOUS) IMPLANT
NEEDLE HYPO 25X1 1.5 SAFETY (NEEDLE) ×3 IMPLANT
NS IRRIG 1000ML POUR BTL (IV SOLUTION) IMPLANT
PACK BASIN DAY SURGERY FS (CUSTOM PROCEDURE TRAY) ×3 IMPLANT
PENCIL BUTTON HOLSTER BLD 10FT (ELECTRODE) ×3 IMPLANT
SLEEVE SCD COMPRESS KNEE MED (MISCELLANEOUS) ×3 IMPLANT
SPONGE LAP 18X18 RF (DISPOSABLE) IMPLANT
SUT MON AB 4-0 PC3 18 (SUTURE) IMPLANT
SUT SILK 2 0 SH (SUTURE) IMPLANT
SUT VICRYL 3-0 CR8 SH (SUTURE) ×3 IMPLANT
SYR CONTROL 10ML LL (SYRINGE) ×3 IMPLANT
TOWEL GREEN STERILE FF (TOWEL DISPOSABLE) ×3 IMPLANT
TOWEL OR NON WOVEN STRL DISP B (DISPOSABLE) IMPLANT
TUBE CONNECTING 20'X1/4 (TUBING) ×1
TUBE CONNECTING 20X1/4 (TUBING) ×2 IMPLANT
YANKAUER SUCT BULB TIP NO VENT (SUCTIONS) IMPLANT

## 2018-07-19 NOTE — Anesthesia Procedure Notes (Signed)
Procedure Name: LMA Insertion Date/Time: 07/19/2018 10:27 AM Performed by: Signe Colt, CRNA Pre-anesthesia Checklist: Patient identified, Emergency Drugs available, Suction available and Patient being monitored Patient Re-evaluated:Patient Re-evaluated prior to induction Oxygen Delivery Method: Circle system utilized Preoxygenation: Pre-oxygenation with 100% oxygen Induction Type: IV induction Ventilation: Mask ventilation without difficulty LMA: LMA inserted LMA Size: 4.0 Number of attempts: 1 Airway Equipment and Method: Bite block Placement Confirmation: positive ETCO2 Tube secured with: Tape Dental Injury: Teeth and Oropharynx as per pre-operative assessment

## 2018-07-19 NOTE — Transfer of Care (Signed)
Immediate Anesthesia Transfer of Care Note  Patient: Laura Garner  Procedure(s) Performed: LEFT BREAST LUMPECTOMY WITH RADIOACTIVE SEED LOCALIZATION ERAS PATHWAY (Left Breast)  Patient Location: PACU  Anesthesia Type:General  Level of Consciousness: awake, alert , oriented and patient cooperative  Airway & Oxygen Therapy: Patient Spontanous Breathing and Patient connected to face mask oxygen  Post-op Assessment: Report given to RN and Post -op Vital signs reviewed and stable  Post vital signs: Reviewed and stable  Last Vitals:  Vitals Value Taken Time  BP    Temp    Pulse 67 07/19/2018 11:22 AM  Resp 21 07/19/2018 11:22 AM  SpO2 99 % 07/19/2018 11:22 AM  Vitals shown include unvalidated device data.  Last Pain:  Vitals:   07/19/18 0923  TempSrc: Oral  PainSc: 0-No pain         Complications: No apparent anesthesia complications

## 2018-07-19 NOTE — Interval H&P Note (Signed)
History and Physical Interval Note:  07/19/2018 10:14 AM  Laura Garner  has presented today for surgery, with the diagnosis of left breast complex sclerosing lesion  The various methods of treatment have been discussed with the patient and family. After consideration of risks, benefits and other options for treatment, the patient has consented to  Procedure(s): LEFT BREAST LUMPECTOMY WITH RADIOACTIVE SEED LOCALIZATION ERAS PATHWAY (Left) as a surgical intervention .  The patient's history has been reviewed, patient examined, no change in status, stable for surgery.  I have reviewed the patient's chart and labs.  Questions were answered to the patient's satisfaction.     TOTH III,Larue Lightner S

## 2018-07-19 NOTE — Anesthesia Postprocedure Evaluation (Signed)
Anesthesia Post Note  Patient: Laura Garner  Procedure(s) Performed: LEFT BREAST LUMPECTOMY WITH RADIOACTIVE SEED LOCALIZATION ERAS PATHWAY (Left Breast)     Patient location during evaluation: PACU Anesthesia Type: General Level of consciousness: awake Pain management: pain level controlled Vital Signs Assessment: post-procedure vital signs reviewed and stable Respiratory status: spontaneous breathing Cardiovascular status: stable Postop Assessment: no headache Anesthetic complications: no    Last Vitals:  Vitals:   07/19/18 1200 07/19/18 1215  BP: (!) 161/64 (!) 178/61  Pulse: (!) 58 (!) 51  Resp: 14 18  Temp:  36.6 C  SpO2: 97% 98%    Last Pain:  Vitals:   07/19/18 1215  TempSrc:   PainSc: 1                  Karlo Goeden

## 2018-07-19 NOTE — Op Note (Signed)
07/19/2018  11:16 AM  PATIENT:  Laura Garner  77 y.o. female  PRE-OPERATIVE DIAGNOSIS:  left breast complex sclerosing lesion  POST-OPERATIVE DIAGNOSIS:  left breast complex sclerosing lesion  PROCEDURE:  Procedure(s): LEFT BREAST LUMPECTOMY WITH RADIOACTIVE SEED LOCALIZATION ERAS PATHWAY (Left)  SURGEON:  Surgeon(s) and Role:    * Jovita Kussmaul, MD - Primary  PHYSICIAN ASSISTANT:   ASSISTANTS: none   ANESTHESIA:   local and general  EBL:  minimal   BLOOD ADMINISTERED:none  DRAINS: none   LOCAL MEDICATIONS USED:  MARCAINE     SPECIMEN:  Source of Specimen:  left breast tissue  DISPOSITION OF SPECIMEN:  PATHOLOGY  COUNTS:  YES  TOURNIQUET:  * No tourniquets in log *  DICTATION: .Dragon Dictation   After informed consent was obtained the patient was brought to the operating room and placed in the supine position on the operating table.  After adequate induction of general anesthesia the patient's left breast was prepped with ChloraPrep, allowed to dry, and draped in usual sterile manner.  An appropriate timeout was performed.  Previously an I-125 seed was placed in the upper outer quadrant of the left breast to mark an area of complex sclerosing lesion.  The neoprobe was set to I-125 in the area of radioactivity was readily identified in the outer aspect of the breast.  The area around this was infiltrated with quarter percent Marcaine.  A curvilinear incision was made on the very outer part of the breast with a 15 blade knife.  The incision was carried through the skin and subcutaneous tissue sharply with electrocautery.  Dissection was then carried towards the radioactive seed under the direction of the neoprobe.  Once I more closely approached the radioactive seed I then removed a circular portion of breast tissue sharply around the radioactive seed while checking the area of radioactivity frequently.  Once the specimen was removed it was oriented with the appropriate paint  colors.  A specimen radiograph was obtained that showed the clip and seed to be near the center of the specimen.  The specimen was then sent to pathology for further evaluation.  Hemostasis was achieved using the Bovie electrocautery.  The wound was irrigated with saline and infiltrated with more quarter percent Marcaine.  The deep layer of the wound was then closed with layers of interrupted 3-0 Vicryl stitches.  The skin was then closed with a running 4-0 Monocryl subcuticular stitch.  Dermabond dressings were applied.  The patient tolerated the procedure well.  At the end of the case all needle sponge and instrument counts were correct.  The patient was then awakened and taken to recovery in stable condition.  PLAN OF CARE: Discharge to home after PACU  PATIENT DISPOSITION:  PACU - hemodynamically stable.   Delay start of Pharmacological VTE agent (>24hrs) due to surgical blood loss or risk of bleeding: not applicable

## 2018-07-19 NOTE — Anesthesia Preprocedure Evaluation (Signed)
Anesthesia Evaluation  Patient identified by MRN, date of birth, ID band Patient awake    Reviewed: Allergy & Precautions, NPO status , Patient's Chart, lab work & pertinent test results, reviewed documented beta blocker date and time   Airway Mallampati: II  TM Distance: >3 FB Neck ROM: Full    Dental no notable dental hx. (+) Teeth Intact, Partial Upper   Pulmonary neg pulmonary ROS,    Pulmonary exam normal breath sounds clear to auscultation       Cardiovascular hypertension, Pt. on medications and Pt. on home beta blockers Normal cardiovascular exam Rhythm:Regular Rate:Normal  ECHO 18 Study Conclusions  - Left ventricle: The cavity size was normal. There was mild   concentric hypertrophy. Systolic function was normal. The   estimated ejection fraction was in the range of 55% to 60%. Wall   motion was normal; there were no regional wall motion   abnormalities. Doppler parameters are consistent with abnormal   left ventricular relaxation (grade 1 diastolic dysfunction). - Mitral valve: Calcified annulus. - Left atrium: The atrium was moderately dilated. - Pulmonary arteries: Systolic pressure was mildly to moderately   increased. PA peak pressure: 45 mm Hg (S).   Neuro/Psych Anxiety negative neurological ROS     GI/Hepatic Neg liver ROS, GERD  Medicated and Controlled,  Endo/Other  diabetes, Well Controlled, Type 2, Oral Hypoglycemic AgentsMorbid obesityHyperlipidemia   Renal/GU Hx/o renal calculi  negative genitourinary   Musculoskeletal  (+) Arthritis ,   Abdominal (+) + obese,   Peds  Hematology  (+) anemia ,   Anesthesia Other Findings   Reproductive/Obstetrics                             Anesthesia Physical  Anesthesia Plan  ASA: III  Anesthesia Plan:    Post-op Pain Management:    Induction: Intravenous  PONV Risk Score and Plan: 2 and Ondansetron and Treatment may  vary due to age or medical condition  Airway Management Planned: LMA and Oral ETT  Additional Equipment:   Intra-op Plan:   Post-operative Plan: Extubation in OR  Informed Consent: I have reviewed the patients History and Physical, chart, labs and discussed the procedure including the risks, benefits and alternatives for the proposed anesthesia with the patient or authorized representative who has indicated his/her understanding and acceptance.   Dental advisory given  Plan Discussed with: CRNA, Anesthesiologist and Surgeon  Anesthesia Plan Comments:         Anesthesia Quick Evaluation

## 2018-07-19 NOTE — H&P (Signed)
Laura Garner  Location: [facility] Patient #: [id number] DOB: 07-20-1941 Divorced / Language: Cleophus Molt / Race: White Female   History of Present Illness The patient is a 77 year old female who presents with a breast mass. We are asked to see the patient in consultation by Dr. Nolon Nations to evaluate her for a complex sclerosing lesion of the left breast. The patient is a 77 year old white female who recently went for a routine screening mammogram. At that time she was found to have a 2.5 cm area of distortion in the upper outer left breast. This was biopsied and came back as a complex sclerosing lesion. She denies any breast pain or discharge from the nipple. She does have a history of breast cancer and a maternal aunt. She does not smoke.   Past Surgical History Breast Biopsy  Left. Foot Surgery  Right. Shoulder Surgery  Bilateral.  Diagnostic Studies History  Colonoscopy  never Mammogram  within last year Pap Smear  >5 years ago  Allergies  Iodinated Contrast Media  Allergies Reconciled   Medication History Carvedilol (12.5MG  Tablet, Oral) Active. Chlorthalidone (25MG  Tablet, Oral) Active. CloNIDine (0.1MG /24HR Patch Weekly, Transdermal) Active. Losartan Potassium (100MG  Tablet, Oral) Active. MetFORMIN HCl (1000MG  Tablet, Oral) Active. Pantoprazole Sodium (40MG  Tablet DR, Oral) Active. Pravastatin Sodium (20MG  Tablet, Oral) Active. Losartan Potassium (50MG  Tablet, Oral) Active. Systane (0.4-0.3% Gel, Ophthalmic) Active. Saline Mist Spray (0.65% Solution, Nasal) Active. Medications Reconciled  Social History  Caffeine use  Carbonated beverages. No alcohol use  No drug use  Tobacco use  Never smoker.  Family History  Cerebrovascular Accident  Mother. Heart Disease  Mother. Hypertension  Father.  Pregnancy / Birth History  Age at menarche  39 years. Age of menopause  46-55 Gravida  3 Maternal age  69-20 Para   3  Other Problems  Back Pain  Diabetes Mellitus  Gastroesophageal Reflux Disease  High blood pressure  Hypercholesterolemia  Kidney Stone  Sleep Apnea     Review of Systems  General Not Present- Appetite Loss, Chills, Fatigue, Fever, Night Sweats, Weight Gain and Weight Loss. Skin Present- Dryness. Not Present- Change in Wart/Mole, Hives, Jaundice, New Lesions, Non-Healing Wounds, Rash and Ulcer. HEENT Present- Hearing Loss, Hoarseness, Seasonal Allergies and Wears glasses/contact lenses. Not Present- Earache, Nose Bleed, Oral Ulcers, Ringing in the Ears, Sinus Pain, Sore Throat, Visual Disturbances and Yellow Eyes. Respiratory Not Present- Bloody sputum, Chronic Cough, Difficulty Breathing, Snoring and Wheezing. Breast Present- Breast Mass. Not Present- Breast Pain, Nipple Discharge and Skin Changes. Cardiovascular Present- Palpitations and Swelling of Extremities. Not Present- Chest Pain, Difficulty Breathing Lying Down, Leg Cramps, Rapid Heart Rate and Shortness of Breath. Gastrointestinal Present- Excessive gas and Indigestion. Not Present- Abdominal Pain, Bloating, Bloody Stool, Change in Bowel Habits, Chronic diarrhea, Constipation, Difficulty Swallowing, Gets full quickly at meals, Hemorrhoids, Nausea, Rectal Pain and Vomiting. Female Genitourinary Present- Frequency and Urgency. Not Present- Nocturia, Painful Urination and Pelvic Pain. Musculoskeletal Present- Back Pain, Muscle Pain and Muscle Weakness. Not Present- Joint Pain, Joint Stiffness and Swelling of Extremities. Neurological Present- Trouble walking. Not Present- Decreased Memory, Fainting, Headaches, Numbness, Seizures, Tingling, Tremor and Weakness. Psychiatric Not Present- Anxiety, Bipolar, Change in Sleep Pattern, Depression, Fearful and Frequent crying. Endocrine Present- Hair Changes. Not Present- Cold Intolerance, Excessive Hunger, Heat Intolerance, Hot flashes and New Diabetes. Hematology Present- Easy  Bruising. Not Present- Blood Thinners, Excessive bleeding, Gland problems, HIV and Persistent Infections.  Vitals  Weight: 196 lb Height: 61in Body Surface Area: 1.87  m Body Mass Index: 37.03 kg/m  Temp.: 98.26F(Oral)  Pulse: 70 (Regular)  BP: 134/82 (Sitting, Left Arm, Standard)       Physical Exam  General Mental Status-Alert. General Appearance-Consistent with stated age. Hydration-Well hydrated. Voice-Normal.  Head and Neck Head-normocephalic, atraumatic with no lesions or palpable masses. Trachea-midline. Thyroid Gland Characteristics - normal size and consistency.  Eye Eyeball - Bilateral-Extraocular movements intact. Sclera/Conjunctiva - Bilateral-No scleral icterus.  Chest and Lung Exam Chest and lung exam reveals -quiet, even and easy respiratory effort with no use of accessory muscles and on auscultation, normal breath sounds, no adventitious sounds and normal vocal resonance. Inspection Chest Wall - Normal. Back - normal.  Breast Note: There is a palpable bruise in the upper outer left breast. Of them unless there is no palpable mass in either breast. There is no palpable axillary, supraclavicular, or cervical lymphadenopathy.   Cardiovascular Cardiovascular examination reveals -normal heart sounds, regular rate and rhythm with no murmurs and normal pedal pulses bilaterally.  Abdomen Inspection Inspection of the abdomen reveals - No Hernias. Skin - Scar - no surgical scars. Palpation/Percussion Palpation and Percussion of the abdomen reveal - Soft, Non Tender, No Rebound tenderness, No Rigidity (guarding) and No hepatosplenomegaly. Auscultation Auscultation of the abdomen reveals - Bowel sounds normal.  Neurologic Neurologic evaluation reveals -alert and oriented x 3 with no impairment of recent or remote memory. Mental Status-Normal.  Musculoskeletal Normal Exam - Left-Upper Extremity Strength Normal and  Lower Extremity Strength Normal. Normal Exam - Right-Upper Extremity Strength Normal and Lower Extremity Strength Normal.  Lymphatic Head & Neck  General Head & Neck Lymphatics: Bilateral - Description - Normal. Axillary  General Axillary Region: Bilateral - Description - Normal. Tenderness - Non Tender. Femoral & Inguinal  Generalized Femoral & Inguinal Lymphatics: Bilateral - Description - Normal. Tenderness - Non Tender.    Assessment & Plan  SCLEROSING ADENOSIS OF BREAST, LEFT (N60.22) Impression: The patient appears to have an area of complex sclerosing lesion in the upper outer left breast measuring about 2.5 cm. Because of the size of the area think it would be very reasonable to remove it since there is about a 5-10% chance of finding something more significant there. This would require a left breast radioactive seed localized lumpectomy. I have discussed with her in detail the risks and benefits of the operation as well as some of the technical aspects and she understands and wishes to proceed Current Plans Pt Education - Breast Diseases: discussed with patient and provided information.

## 2018-07-19 NOTE — Discharge Instructions (Signed)
No Tylenol until 3:45pm No ibuprofen until 5:45pm   Post Anesthesia Home Care Instructions  Activity: Get plenty of rest for the remainder of the day. A responsible individual must stay with you for 24 hours following the procedure.  For the next 24 hours, DO NOT: -Drive a car -Paediatric nurse -Drink alcoholic beverages -Take any medication unless instructed by your physician -Make any legal decisions or sign important papers.  Meals: Start with liquid foods such as gelatin or soup. Progress to regular foods as tolerated. Avoid greasy, spicy, heavy foods. If nausea and/or vomiting occur, drink only clear liquids until the nausea and/or vomiting subsides. Call your physician if vomiting continues.  Special Instructions/Symptoms: Your throat may feel dry or sore from the anesthesia or the breathing tube placed in your throat during surgery. If this causes discomfort, gargle with warm salt water. The discomfort should disappear within 24 hours.  If you had a scopolamine patch placed behind your ear for the management of post- operative nausea and/or vomiting:  1. The medication in the patch is effective for 72 hours, after which it should be removed.  Wrap patch in a tissue and discard in the trash. Wash hands thoroughly with soap and water. 2. You may remove the patch earlier than 72 hours if you experience unpleasant side effects which may include dry mouth, dizziness or visual disturbances. 3. Avoid touching the patch. Wash your hands with soap and water after contact with the patch.

## 2018-07-22 ENCOUNTER — Encounter (HOSPITAL_BASED_OUTPATIENT_CLINIC_OR_DEPARTMENT_OTHER): Payer: Self-pay | Admitting: General Surgery

## 2018-07-22 DIAGNOSIS — E1149 Type 2 diabetes mellitus with other diabetic neurological complication: Secondary | ICD-10-CM | POA: Diagnosis not present

## 2018-07-22 DIAGNOSIS — I10 Essential (primary) hypertension: Secondary | ICD-10-CM | POA: Diagnosis not present

## 2018-07-22 DIAGNOSIS — N182 Chronic kidney disease, stage 2 (mild): Secondary | ICD-10-CM | POA: Diagnosis not present

## 2018-07-22 DIAGNOSIS — G4733 Obstructive sleep apnea (adult) (pediatric): Secondary | ICD-10-CM | POA: Diagnosis not present

## 2018-07-30 DIAGNOSIS — M6281 Muscle weakness (generalized): Secondary | ICD-10-CM | POA: Diagnosis not present

## 2018-07-30 DIAGNOSIS — R2689 Other abnormalities of gait and mobility: Secondary | ICD-10-CM | POA: Diagnosis not present

## 2018-08-07 DIAGNOSIS — R2689 Other abnormalities of gait and mobility: Secondary | ICD-10-CM | POA: Diagnosis not present

## 2018-08-07 DIAGNOSIS — M6281 Muscle weakness (generalized): Secondary | ICD-10-CM | POA: Diagnosis not present

## 2018-08-12 ENCOUNTER — Ambulatory Visit (INDEPENDENT_AMBULATORY_CARE_PROVIDER_SITE_OTHER): Payer: Medicare Other | Admitting: Podiatry

## 2018-08-12 ENCOUNTER — Encounter: Payer: Self-pay | Admitting: Podiatry

## 2018-08-12 DIAGNOSIS — B351 Tinea unguium: Secondary | ICD-10-CM | POA: Diagnosis not present

## 2018-08-12 DIAGNOSIS — M79674 Pain in right toe(s): Secondary | ICD-10-CM

## 2018-08-12 DIAGNOSIS — M79675 Pain in left toe(s): Secondary | ICD-10-CM

## 2018-08-14 NOTE — Progress Notes (Signed)
Subjective: 77 y.o. returns the office today for painful, elongated, thickened toenails which she cannot trim herself. Denies any redness or drainage around the nails.  Overall her feet are doing well. She has been going to PT for gait instability. Denies any acute changes since last appointment and no new complaints today. Denies any systemic complaints such as fevers, chills, nausea, vomiting.   PCP: Isaias Sakai, DO   Objective: AAO 3, NAD DP/PT pulses palpable, CRT less than 3 seconds Nails hypertrophic, dystrophic, elongated, brittle, discolored 10. There is tenderness overlying the nails 1-5 bilaterally. There is no surrounding erythema or drainage along the nail sites. No open lesions or pre-ulcerative lesions are identified. No other areas of tenderness bilateral lower extremities. No overlying edema, erythema, increased warmth. No pain with calf compression, swelling, warmth, erythema.  Assessment: Patient presents with symptomatic onychomycosis  Plan: -Treatment options including alternatives, risks, complications were discussed -Nails sharply debrided 10 without complication/bleeding. -Discussed daily foot inspection. If there are any changes, to call the office immediately.  -Follow-up in 9 weeks  or sooner if any problems are to arise. In the meantime, encouraged to call the office with any questions, concerns, changes symptoms.  Celesta Gentile, DPM

## 2018-08-20 DIAGNOSIS — R42 Dizziness and giddiness: Secondary | ICD-10-CM | POA: Diagnosis not present

## 2018-08-27 DIAGNOSIS — R42 Dizziness and giddiness: Secondary | ICD-10-CM | POA: Diagnosis not present

## 2018-09-04 DIAGNOSIS — Z23 Encounter for immunization: Secondary | ICD-10-CM | POA: Diagnosis not present

## 2018-09-09 DIAGNOSIS — Z136 Encounter for screening for cardiovascular disorders: Secondary | ICD-10-CM | POA: Diagnosis not present

## 2018-09-09 DIAGNOSIS — Z0000 Encounter for general adult medical examination without abnormal findings: Secondary | ICD-10-CM | POA: Diagnosis not present

## 2018-09-09 DIAGNOSIS — E785 Hyperlipidemia, unspecified: Secondary | ICD-10-CM | POA: Diagnosis not present

## 2018-09-09 DIAGNOSIS — E669 Obesity, unspecified: Secondary | ICD-10-CM | POA: Diagnosis not present

## 2018-09-09 DIAGNOSIS — Z6835 Body mass index (BMI) 35.0-35.9, adult: Secondary | ICD-10-CM | POA: Diagnosis not present

## 2018-09-09 DIAGNOSIS — Z9181 History of falling: Secondary | ICD-10-CM | POA: Diagnosis not present

## 2018-09-17 DIAGNOSIS — R42 Dizziness and giddiness: Secondary | ICD-10-CM | POA: Diagnosis not present

## 2018-09-17 DIAGNOSIS — H9319 Tinnitus, unspecified ear: Secondary | ICD-10-CM | POA: Diagnosis not present

## 2018-09-17 DIAGNOSIS — H903 Sensorineural hearing loss, bilateral: Secondary | ICD-10-CM | POA: Diagnosis not present

## 2018-09-17 DIAGNOSIS — J342 Deviated nasal septum: Secondary | ICD-10-CM | POA: Diagnosis not present

## 2018-09-18 DIAGNOSIS — M5432 Sciatica, left side: Secondary | ICD-10-CM | POA: Diagnosis not present

## 2018-09-18 DIAGNOSIS — M4304 Spondylolysis, thoracic region: Secondary | ICD-10-CM | POA: Diagnosis not present

## 2018-09-18 DIAGNOSIS — M9901 Segmental and somatic dysfunction of cervical region: Secondary | ICD-10-CM | POA: Diagnosis not present

## 2018-09-18 DIAGNOSIS — M9902 Segmental and somatic dysfunction of thoracic region: Secondary | ICD-10-CM | POA: Diagnosis not present

## 2018-09-18 DIAGNOSIS — M9905 Segmental and somatic dysfunction of pelvic region: Secondary | ICD-10-CM | POA: Diagnosis not present

## 2018-09-18 DIAGNOSIS — M4303 Spondylolysis, cervicothoracic region: Secondary | ICD-10-CM | POA: Diagnosis not present

## 2018-09-24 ENCOUNTER — Encounter: Payer: Self-pay | Admitting: Pulmonary Disease

## 2018-09-24 ENCOUNTER — Ambulatory Visit (INDEPENDENT_AMBULATORY_CARE_PROVIDER_SITE_OTHER): Payer: Medicare Other | Admitting: Pulmonary Disease

## 2018-09-24 VITALS — BP 140/76 | HR 72 | Resp 16 | Ht 61.5 in | Wt 191.0 lb

## 2018-09-24 DIAGNOSIS — E668 Other obesity: Secondary | ICD-10-CM

## 2018-09-24 DIAGNOSIS — G4733 Obstructive sleep apnea (adult) (pediatric): Secondary | ICD-10-CM

## 2018-09-24 NOTE — Patient Instructions (Signed)
Continue CPAP with sleep Continue your excellent efforts at weight loss Follow-up in 6 months.  Call sooner if needed

## 2018-09-26 NOTE — Progress Notes (Signed)
PULMONARY/SLEEP OFFICE FOLLOW UP NOTE  Requesting MD/Service: Ignacia Bayley, PA Date of initial consultation: 12/19/17 Reason for consultation: Severe hypertension, rule out OSA  PT PROFILE: 77 y.o. female never smoker referred to rule out OSA due to refractory hypertension  DATA: PSG 01/02/18: mod-severe OSA. RDI 37.4. Autoset 5-20 recommended Compliance 02/25-03/26/19: 30/30 nights.  Compliance 09/03-10/02/19: 30/30 nights. Mean usage 7.2 hrs. Median pressure 19.7 mmHg. AHI 19.4  SUBJ:  This is a scheduled follow-up for obstructive sleep apnea.  She is wearing CPAP compliantly.  She does not like it a lot but has been very consistent in use as demonstrated by compliance report above.  She believes she is well rested and denies daytime hypersomnolence.  Her blood pressure control is improved but still somewhat problematic.  She has no new complaints.  She continues to work diligently on weight loss.    Vitals:   09/24/18 1031 09/24/18 1038  BP:  140/76  Pulse:  72  Resp: 16   SpO2:  97%  Weight: 191 lb (86.6 kg)   Height: 5' 1.5" (1.562 m)       EXAM:  Gen: NAD HEENT: NCAT, sclera white Neck: No JVD Lungs: breath sounds full, no wheezes or other adventitious sounds Cardiovascular: RRR, no murmurs Abdomen: Soft, nontender, normal BS Ext: without clubbing, cyanosis, edema Neuro: grossly intact Skin: Limited exam, no lesions noted    DATA:   BMP Latest Ref Rng & Units 07/10/2018 01/16/2018 12/14/2017  Glucose 70 - 99 mg/dL 135(H) 176(H) 143(H)  BUN 8 - 23 mg/dL 15 24 19   Creatinine 0.44 - 1.00 mg/dL 0.85 1.09(H) 0.81  BUN/Creat Ratio 12 - 28 - 22 23  Sodium 135 - 145 mmol/L 143 145(H) 143  Potassium 3.5 - 5.1 mmol/L 4.5 4.3 4.5  Chloride 98 - 111 mmol/L 109 107(H) 106  CO2 22 - 32 mmol/L 24 23 22   Calcium 8.9 - 10.3 mg/dL 10.0 10.3 9.6    CBC Latest Ref Rng & Units 03/21/2017 06/26/2007  WBC 4.0 - 10.5 K/uL 7.1 -  Hemoglobin 12.0 - 15.0 g/dL 11.4(L) 10.2(L)   Hematocrit 36.0 - 46.0 % 33.8(L) -  Platelets 150 - 400 K/uL 214 -    CXR: No recent films  IMPRESSION:   1) Refractory hypertension - improved control with Rx of OSA 2) Mod-severe OSA on AutoSet CPAP - tolerating well, compliant with it, benefiting from it 3) Moderate obesity with excellent wt loss efforts  I am concerned that her sleep apnea still appears to be inadequately controlled based on the compliance report.  We discussed the possibility of performing lab titration study.  She wishes to forego this for now.  PLAN:  Continue CPAP with sleep Continue her excellent efforts at weight loss.  We discussed dietary changes that might be beneficial Follow-up in 6 months.  Call sooner if needed   Merton Border, MD PCCM service Mobile [phone] Pager [phone] 09/26/2018 11:33 AM

## 2018-10-08 ENCOUNTER — Ambulatory Visit (INDEPENDENT_AMBULATORY_CARE_PROVIDER_SITE_OTHER): Payer: Medicare Other | Admitting: Nurse Practitioner

## 2018-10-08 ENCOUNTER — Encounter: Payer: Self-pay | Admitting: Nurse Practitioner

## 2018-10-08 VITALS — BP 162/74 | HR 57 | Ht 61.5 in | Wt 184.8 lb

## 2018-10-08 DIAGNOSIS — I10 Essential (primary) hypertension: Secondary | ICD-10-CM

## 2018-10-08 DIAGNOSIS — R0609 Other forms of dyspnea: Secondary | ICD-10-CM

## 2018-10-08 NOTE — Progress Notes (Signed)
Office Visit    Patient Name: Laura Garner Date of Encounter: 10/08/2018  Primary Care Provider:  Isaias Sakai, DO Primary Cardiologist:  Nelva Bush, MD  Chief Complaint    77 year old female with a history of difficult to control hypertension, hyperlipidemia, type 2 diabetes mellitus, stage II chronic kidney disease, obesity, and GERD, who presents for follow-up related to hypertension.  Past Medical History    Past Medical History:  Diagnosis Date  . Anemia   . Anxiety    no meds  . Arthritis    neck - otc med prn  . Chest pain    a. 10/2017 MV: EF 55-65%, no ischemia, infarct.  . Diabetes mellitus    type 2  . Diastolic dysfunction    a. 10/2017 Echo: EF 55-60%, no rwma, Gr1 DD, mod dil LA, PASP 27mmHg.  Marland Kitchen GERD (gastroesophageal reflux disease)   . Heart palpitations    hx - caused by medication  . History of kidney stones    multiple kidney stones -passed some stones and surgery x 3   . Hyperlipidemia   . Hypertension    a. Difficult to control-->10/2017 Renal Duplex: < 60% bilat RAS.  Marland Kitchen Left breast mass   . PAH (pulmonary artery hypertension) ([place])    a. 10/2017 Echo: PASP 64mmHg.  Marland Kitchen RBBB   . Reflux   . Seasonal allergies   . Sleep apnea    wears CPAP nightly  . SVD (spontaneous vaginal delivery)    x 3   Past Surgical History:  Procedure Laterality Date  . BREAST LUMPECTOMY WITH RADIOACTIVE SEED LOCALIZATION Left 07/19/2018   Procedure: LEFT BREAST LUMPECTOMY WITH RADIOACTIVE SEED LOCALIZATION ERAS PATHWAY;  Surgeon: Jovita Kussmaul, MD;  Location: [facility];  Service: General;  Laterality: Left;  . HYSTEROSCOPY W/D&C N/A 03/22/2017   Procedure: DILATATION AND CURETTAGE /HYSTEROSCOPY;  Surgeon: Newton Pigg, MD;  Location: [facility];  Service: Gynecology;  Laterality: N/A;  . lithrotripsy     surgery x 3  . right foot surgery     repair tendon  . SHOULDER SURGERY     x 2 - right and left    Allergies  Allergies    Allergen Reactions  . Iodinated Diagnostic Agents Anaphylaxis  . Amlodipine     Swelling/'foggy sensation'  . Avelox [Moxifloxacin Hcl]     Weakness and felt bad  . Doxazosin     Explosive diarrhea   . Hydralazine     anxiety  . Iodine   . Trimethoprim Other (See Comments) and Diarrhea    Extreme diarrhea     History of Present Illness    77 year old female with the above complex past medical history including difficult to control hypertension, hyperlipidemia, diabetes, stage II chronic kidney disease, obesity, and GERD.  She has had multiple medication intolerances in the setting of trying to get her blood pressure down including amlodipine, hydralazine, HCTZ, doxazosin, and lisinopril.  In November 2018, she underwent work-up for secondary hypertension and was found to have a normal serum aldosterone/plasma renin activity, no significant renal artery stenosis on renal duplex, and normal LV function on echocardiogram.  Stress testing was undertaken and was nonischemic.  Since being placed on chlorthalidone in December 2018, her blood pressure has been much improved.  She did have an issue with supplementary potassium, causing diarrhea, but this has not been an issue since its discontinuation earlier this year.  Since her last visit in February, her blood  pressure remains stable in the 130s at home.  She checks her blood pressure daily and has a list of her pressures with her today.  Though there are occasional 160s and 150s, for the most part, she is in the 130s.  She has been more active and rides a recumbent bicycle.  She is now able to do 3 miles and 20 minutes which is an improvement from just 5 minutes earlier this year.  She does not experience chest pain.  Activity is otherwise limited by chronic foot pain and back pain.  She has been working with physical therapy for her gait and balance and tolerating this well.  In July, she underwent biopsy for a complex sclerosing lesion of the  left breast and this turned out to be benign.  She denies PND, orthopnea, dizziness, syncope, or early satiety.  She still has mild ankle edema over her sock line but this is significantly improved since being placed on chlorthalidone.  Home Medications    Prior to Admission medications   Medication Sig Start Date End Date Taking? Authorizing Provider  aspirin EC 81 MG tablet Take 81 mg by mouth daily.   Yes [provider]  Calcium Carbonate-Vit D-Min (CALTRATE PLUS PO) Take 1 tablet by mouth daily.    Yes [provider]  carvedilol (COREG) 12.5 MG tablet Take 12.5 mg by mouth 2 (two) times daily with a meal.   Yes [provider]  chlorthalidone (HYGROTON) 25 MG tablet Take 0.5 tablets (12.5 mg total) by mouth daily. 12/05/17 10/08/18 Yes Theora Gianotti, NP  cloNIDine (CATAPRES - DOSED IN MG/24 HR) 0.1 mg/24hr patch  09/13/17  Yes [provider]  ferrous sulfate 325 (65 FE) MG tablet Take 325 mg by mouth daily with breakfast.   Yes [provider]  loratadine (CLARITIN) 10 MG tablet Take 10 mg by mouth daily.   Yes [provider]  losartan (COZAAR) 100 MG tablet Take 100 mg by mouth at bedtime.    Yes [provider]  magnesium (MAGTAB) 84 MG (7MEQ) TBCR SR tablet Take 84 mg by mouth.   Yes [provider]  metFORMIN (GLUCOPHAGE) 1000 MG tablet Take 1,000 mg by mouth 2 (two) times daily with a meal.   Yes [provider]  Multiple Vitamin (MULTIVITAMIN) capsule Take 1 capsule by mouth daily.    Yes [provider]  Omega-3 Fatty Acids (FISH OIL) 1000 MG CPDR Take 1 capsule by mouth 2 (two) times daily.    Yes [provider]  pantoprazole (PROTONIX) 40 MG tablet Take 40 mg by mouth daily as needed.  05/31/15  Yes [provider]  Polyethyl Glycol-Propyl Glycol (SYSTANE ULTRA OP) Place 1 drop into both eyes daily as needed (dry eyes).   Yes [provider]  pravastatin  (PRAVACHOL) 20 MG tablet Take 20 mg by mouth at bedtime.    Yes [provider]  vitamin B-12 (CYANOCOBALAMIN) 1000 MCG tablet Take 1,000 mcg by mouth daily.   Yes [provider]    Review of Systems    As above, overall doing well.  Activity limited by foot and back pain.  She does not usually experience dyspnea with normal activity and is able to ride her recumbent bike for 20 minutes a day.  She denies chest pain, palpitations, PND, orthopnea, dizziness, syncope, or early satiety.  She still has very mild lower extremity swelling which is markedly improved overall.  All other systems reviewed and are  otherwise negative except as noted above.  Physical Exam    VS:  BP (!) 162/74 (BP Location: Left Arm, Patient Position: Sitting, Cuff Size: Normal)   Pulse (!) 57   Ht 5' 1.5" (1.562 m)   Wt 184 lb 12 oz (83.8 kg)   BMI 34.34 kg/m  , BMI Body mass index is 34.34 kg/m.  I repeated her blood pressure and got 160/80. GEN: Well nourished, well developed, in no acute distress. HEENT: normal. Neck: Supple, no JVD, carotid bruits, or masses. Cardiac: RRR, no murmurs, rubs, or gallops. No clubbing, cyanosis, trace bilateral ankle edema over her sock line.  Radials/DP/PT 2+ and equal bilaterally.  Respiratory:  Respirations regular and unlabored, clear to auscultation bilaterally. GI: Soft, nontender, nondistended, BS + x 4. MS: no deformity or atrophy. Skin: warm and dry, no rash. Neuro:  Strength and sensation are intact. Psych: Normal affect.  Accessory Clinical Findings    ECG personally reviewed by me today -sinus bradycardia, 57, right bundle branch block, LVH- no acute changes.  Lab Results  Component Value Date   CREATININE 0.85 07/10/2018   BUN 15 07/10/2018   NA 143 07/10/2018   K 4.5 07/10/2018   CL 109 07/10/2018   CO2 24 07/10/2018    Assessment & Plan    1.  Essential hypertension: Patient brings her home blood pressures with her today.  Of note, she  does have a radial cuff but has checked this against her primary care providers sphygmomanometer and reports that it showed equivalent readings.  Her pressures at home are typically in the 130s.  She is higher today but given stability at home, I will not make any changes to her medicines.  She remains on beta-blocker, ARB, and diuretic therapy and is tolerating all well.  2.  Chronic dyspnea on exertion: This is improved significantly and she is now using a recumbent bicycle 20 minutes a day.  She is able to ride 3 miles and that time.  She does increase the resistance for the last mile.  She has been seen by pulmonology and is tolerating CPAP well.  She has also lost some weight.  Overall, dyspnea has improved significantly.  3.  Obstructive sleep apnea: Compliant with CPAP.  4.  GERD: Stable on PPI.  5.  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  6.  Disposition: Patient is doing exceptionally well with stable blood pressures at home.  She has primary care follow-up every 3 to 4 months.  Follow-up in 1 year or sooner if necessary.  Murray Hodgkins, NP 10/08/2018, 10:42 AM

## 2018-10-08 NOTE — Patient Instructions (Signed)
Medication Instructions:  Your physician recommends that you continue on your current medications as directed. Please refer to the Current Medication list given to you today.  If you need a refill on your cardiac medications before your next appointment, please call your pharmacy.   Lab work: none If you have labs (blood work) drawn today and your tests are completely normal, you will receive your results only by: . MyChart Message (if you have MyChart) OR . A paper copy in the mail If you have any lab test that is abnormal or we need to change your treatment, we will call you to review the results.  Testing/Procedures: none  Follow-Up: At [facility], you and your health needs are our priority.  As part of our continuing mission to provide you with exceptional heart care, we have created designated Provider Care Teams.  These Care Teams include your primary Cardiologist (physician) and Advanced Practice Providers (APPs -  Physician Assistants and Nurse Practitioners) who all work together to provide you with the care you need, when you need it. You will need a follow up appointment in 12 months.  Please call our office 2 months in advance to schedule this appointment.  You may see Christopher End, MD or one of the following Advanced Practice Providers on your designated Care Team:   Christopher Berge, NP Ryan Dunn, PA-C . Jacquelyn Visser, PA-C    

## 2018-10-14 DIAGNOSIS — M9902 Segmental and somatic dysfunction of thoracic region: Secondary | ICD-10-CM | POA: Diagnosis not present

## 2018-10-14 DIAGNOSIS — M5388 Other specified dorsopathies, sacral and sacrococcygeal region: Secondary | ICD-10-CM | POA: Diagnosis not present

## 2018-10-14 DIAGNOSIS — M9903 Segmental and somatic dysfunction of lumbar region: Secondary | ICD-10-CM | POA: Diagnosis not present

## 2018-10-14 DIAGNOSIS — M4305 Spondylolysis, thoracolumbar region: Secondary | ICD-10-CM | POA: Diagnosis not present

## 2018-10-14 DIAGNOSIS — M4306 Spondylolysis, lumbar region: Secondary | ICD-10-CM | POA: Diagnosis not present

## 2018-10-14 DIAGNOSIS — M9904 Segmental and somatic dysfunction of sacral region: Secondary | ICD-10-CM | POA: Diagnosis not present

## 2018-10-22 DIAGNOSIS — I10 Essential (primary) hypertension: Secondary | ICD-10-CM | POA: Diagnosis not present

## 2018-10-22 DIAGNOSIS — K529 Noninfective gastroenteritis and colitis, unspecified: Secondary | ICD-10-CM | POA: Diagnosis not present

## 2018-10-22 DIAGNOSIS — Z6833 Body mass index (BMI) 33.0-33.9, adult: Secondary | ICD-10-CM | POA: Diagnosis not present

## 2018-10-22 DIAGNOSIS — E1149 Type 2 diabetes mellitus with other diabetic neurological complication: Secondary | ICD-10-CM | POA: Diagnosis not present

## 2018-10-29 ENCOUNTER — Ambulatory Visit (INDEPENDENT_AMBULATORY_CARE_PROVIDER_SITE_OTHER): Payer: Medicare Other | Admitting: Podiatry

## 2018-10-29 ENCOUNTER — Encounter: Payer: Self-pay | Admitting: Podiatry

## 2018-10-29 DIAGNOSIS — B351 Tinea unguium: Secondary | ICD-10-CM

## 2018-10-29 DIAGNOSIS — M79675 Pain in left toe(s): Secondary | ICD-10-CM

## 2018-10-29 DIAGNOSIS — M79674 Pain in right toe(s): Secondary | ICD-10-CM

## 2018-10-29 DIAGNOSIS — Q828 Other specified congenital malformations of skin: Secondary | ICD-10-CM | POA: Diagnosis not present

## 2018-10-29 NOTE — Patient Instructions (Signed)

## 2018-10-30 NOTE — Progress Notes (Signed)
Subjective: 77 y.o. returns the office today for painful, elongated, thickened toenails which she cannot trim herself. Denies any redness or drainage around the nails.  She has been using a recumbent bike.  She gets some pain in the arch of her foot at times otherwise she is doing well.  She was think about having injections to her feet today but has not been really bothering her her as to the capsulitis that she has had previously.  Denies any acute changes since last appointment and no new complaints today. Denies any systemic complaints such as fevers, chills, nausea, vomiting.   PCP: Isaias Sakai, DO   Objective: AAO 3, NAD DP/PT pulses palpable, CRT less than 3 seconds Nails hypertrophic, dystrophic, elongated, brittle, discolored 10. There is tenderness overlying the nails 1-5 bilaterally. There is no surrounding erythema or drainage along the nail sites. She is getting hyperkeratotic lesions on the arch of the foot along the talar prominence.  Flatfoot deformities present.  Upon debridement of the calluses there is no underlying ulceration drainage or any signs of infection. No open lesions or pre-ulcerative lesions are identified. Stability some discomfort in the arch of the foot but no tenderness today.  No area pinpoint tenderness. No pain with calf compression, swelling, warmth, erythema.  Assessment: Patient presents with symptomatic onychomycosis, hyperkeratotic lesions, arch pain  Plan: -Treatment options including alternatives, risks, complications were discussed -Nails sharply debrided 10 without complication/bleeding. -Hyperkeratotic lesion sharply debrided x2 without any complications or bleeding. -Discussed general stretching exercises for the plantar fasciitis/arch pain that she is experiencing. -Discussed daily foot inspection. If there are any changes, to call the office immediately.  -Follow-up in 9 weeks  or sooner if any problems are to arise. In the  meantime, encouraged to call the office with any questions, concerns, changes symptoms.  Celesta Gentile, DPM

## 2018-11-25 DIAGNOSIS — K529 Noninfective gastroenteritis and colitis, unspecified: Secondary | ICD-10-CM | POA: Diagnosis not present

## 2018-11-25 DIAGNOSIS — Z6832 Body mass index (BMI) 32.0-32.9, adult: Secondary | ICD-10-CM | POA: Diagnosis not present

## 2018-11-25 DIAGNOSIS — R600 Localized edema: Secondary | ICD-10-CM | POA: Diagnosis not present

## 2018-11-25 DIAGNOSIS — I872 Venous insufficiency (chronic) (peripheral): Secondary | ICD-10-CM | POA: Diagnosis not present

## 2018-12-03 ENCOUNTER — Telehealth: Payer: Self-pay | Admitting: Nurse Practitioner

## 2018-12-03 ENCOUNTER — Other Ambulatory Visit: Payer: Self-pay | Admitting: *Deleted

## 2018-12-03 MED ORDER — CHLORTHALIDONE 25 MG PO TABS: 12.5000 mg | ORAL_TABLET | Freq: Every day | ORAL | 3 refills | Status: DC

## 2018-12-03 NOTE — Telephone Encounter (Signed)
Chlorthalidone #45 R#3 sent to [place].

## 2018-12-03 NOTE — Telephone Encounter (Signed)
*  STAT* If patient is at the pharmacy, call can be transferred to refill team.   1. Which medications need to be refilled? (please list name of each medication and dose if known)  Chlorthalidone  2. Which pharmacy/location (including street and city if local pharmacy) is medication to be sent to? [place]  3. Do they need a 30 day or 90 day supply? [place]

## 2018-12-31 ENCOUNTER — Ambulatory Visit (INDEPENDENT_AMBULATORY_CARE_PROVIDER_SITE_OTHER): Payer: Medicare Other | Admitting: Podiatry

## 2018-12-31 DIAGNOSIS — B351 Tinea unguium: Secondary | ICD-10-CM | POA: Diagnosis not present

## 2018-12-31 DIAGNOSIS — E119 Type 2 diabetes mellitus without complications: Secondary | ICD-10-CM | POA: Diagnosis not present

## 2018-12-31 DIAGNOSIS — M79674 Pain in right toe(s): Secondary | ICD-10-CM | POA: Diagnosis not present

## 2018-12-31 DIAGNOSIS — M79675 Pain in left toe(s): Secondary | ICD-10-CM | POA: Diagnosis not present

## 2018-12-31 DIAGNOSIS — Q828 Other specified congenital malformations of skin: Secondary | ICD-10-CM

## 2018-12-31 NOTE — Progress Notes (Signed)
Subjective: 78 y.o. returns the office today for painful, elongated, thickened toenails which she cannot trim herself as well as for calluses to both of her feet.  She states that she has stopped wearing the compression socks because it was aggravating her skin causing skin irritation.  Otherwise she is doing well she is having some minimal pain to her feet and she does not need steroid injections today.  Denies any recent injury or falls and she has no other concerns.    PCP: Isaias Sakai, DO   Objective: AAO 3, NAD DP/PT pulses palpable, CRT less than 3 seconds Nails hypertrophic, dystrophic, elongated, brittle, discolored 10. There is tenderness overlying the nails 1-5 bilaterally. There is no surrounding erythema or drainage along the nail sites. She is getting hyperkeratotic lesions on the arch of the foot along the talar prominence.  Flatfoot deformities present.  Upon debridement of the calluses there is no underlying ulceration drainage or any signs of infection. No open lesions or pre-ulcerative lesions are identified. No areas of tenderness today otherwise.  No pain with calf compression, swelling, warmth, erythema.  Assessment: Patient presents with symptomatic onychomycosis, hyperkeratotic lesions, arch pain  Plan: -Treatment options including alternatives, risks, complications were discussed -Nails sharply debrided 10 without complication/bleeding. -Hyperkeratotic lesion sharply debrided x2 without any complications or bleeding. -Discussed general stretching exercises for the plantar fasciitis/arch pain that she is experiencing. -Discussed daily foot inspection. If there are any changes, to call the office immediately.  -Follow-up in 9 weeks  or sooner if any problems are to arise. In the meantime, encouraged to call the office with any questions, concerns, changes symptoms.  Celesta Gentile, DPM

## 2019-01-24 DIAGNOSIS — N189 Chronic kidney disease, unspecified: Secondary | ICD-10-CM | POA: Diagnosis not present

## 2019-01-24 DIAGNOSIS — E1149 Type 2 diabetes mellitus with other diabetic neurological complication: Secondary | ICD-10-CM | POA: Diagnosis not present

## 2019-01-24 DIAGNOSIS — E782 Mixed hyperlipidemia: Secondary | ICD-10-CM | POA: Diagnosis not present

## 2019-01-28 DIAGNOSIS — Z1331 Encounter for screening for depression: Secondary | ICD-10-CM | POA: Diagnosis not present

## 2019-01-28 DIAGNOSIS — I10 Essential (primary) hypertension: Secondary | ICD-10-CM | POA: Diagnosis not present

## 2019-01-28 DIAGNOSIS — E1149 Type 2 diabetes mellitus with other diabetic neurological complication: Secondary | ICD-10-CM | POA: Diagnosis not present

## 2019-01-28 DIAGNOSIS — K529 Noninfective gastroenteritis and colitis, unspecified: Secondary | ICD-10-CM | POA: Diagnosis not present

## 2019-03-03 ENCOUNTER — Ambulatory Visit: Payer: Medicare Other | Admitting: Podiatry

## 2019-03-11 ENCOUNTER — Ambulatory Visit: Payer: Medicare Other | Admitting: Podiatry

## 2019-03-13 DIAGNOSIS — M4304 Spondylolysis, thoracic region: Secondary | ICD-10-CM | POA: Diagnosis not present

## 2019-03-13 DIAGNOSIS — M9902 Segmental and somatic dysfunction of thoracic region: Secondary | ICD-10-CM | POA: Diagnosis not present

## 2019-03-13 DIAGNOSIS — M9903 Segmental and somatic dysfunction of lumbar region: Secondary | ICD-10-CM | POA: Diagnosis not present

## 2019-03-13 DIAGNOSIS — M4306 Spondylolysis, lumbar region: Secondary | ICD-10-CM | POA: Diagnosis not present

## 2019-03-13 DIAGNOSIS — M5388 Other specified dorsopathies, sacral and sacrococcygeal region: Secondary | ICD-10-CM | POA: Diagnosis not present

## 2019-03-13 DIAGNOSIS — M9904 Segmental and somatic dysfunction of sacral region: Secondary | ICD-10-CM | POA: Diagnosis not present

## 2019-03-18 ENCOUNTER — Ambulatory Visit: Payer: Medicare Other | Admitting: Podiatry

## 2019-03-24 ENCOUNTER — Encounter: Payer: Self-pay | Admitting: Podiatry

## 2019-03-24 ENCOUNTER — Ambulatory Visit (INDEPENDENT_AMBULATORY_CARE_PROVIDER_SITE_OTHER): Payer: Medicare Other | Admitting: Podiatry

## 2019-03-24 ENCOUNTER — Other Ambulatory Visit: Payer: Self-pay

## 2019-03-24 VITALS — Temp 97.3°F

## 2019-03-24 DIAGNOSIS — L84 Corns and callosities: Secondary | ICD-10-CM | POA: Diagnosis not present

## 2019-03-24 DIAGNOSIS — B351 Tinea unguium: Secondary | ICD-10-CM | POA: Diagnosis not present

## 2019-03-24 DIAGNOSIS — M79674 Pain in right toe(s): Secondary | ICD-10-CM

## 2019-03-24 DIAGNOSIS — E119 Type 2 diabetes mellitus without complications: Secondary | ICD-10-CM

## 2019-03-24 DIAGNOSIS — M79675 Pain in left toe(s): Secondary | ICD-10-CM

## 2019-03-27 NOTE — Progress Notes (Signed)
Subjective: Laura Garner is a 78 y.o. y.o. female who presents to clinic for preventative diabetic foot care. She has flatfoot deformity and chronic callosities plantar arch b/l. She also has elongated, painful, discolored, thick toenails which interfere with daily activities. Pain is aggravated when wearing enclosed shoe gear and relieved with periodic professional debridement.  Isaias Sakai, DO is her PCP.   Current Outpatient Medications:  .  aspirin EC 81 MG tablet, Take 81 mg by mouth daily., Disp: , Rfl:  .  Calcium Carbonate-Vit D-Min (CALTRATE PLUS PO), Take 1 tablet by mouth daily. , Disp: , Rfl:  .  carvedilol (COREG) 12.5 MG tablet, Take 12.5 mg by mouth 2 (two) times daily with a meal., Disp: , Rfl:  .  chlorthalidone (HYGROTON) 25 MG tablet, Take 0.5 tablets (12.5 mg total) by mouth daily., Disp: 45 tablet, Rfl: 3 .  cloNIDine (CATAPRES - DOSED IN MG/24 HR) 0.1 mg/24hr patch, , Disp: , Rfl:  .  cloNIDine (CATAPRES - DOSED IN MG/24 HR) 0.1 mg/24hr patch, APPLY 1 PATCH TOPICALLY ONCE A WEEK, Disp: , Rfl: 2 .  ferrous sulfate 325 (65 FE) MG tablet, Take 325 mg by mouth daily with breakfast., Disp: , Rfl:  .  loratadine (CLARITIN) 10 MG tablet, Take 10 mg by mouth daily., Disp: , Rfl:  .  losartan (COZAAR) 100 MG tablet, Take 100 mg by mouth at bedtime. , Disp: , Rfl:  .  magnesium (MAGTAB) 84 MG (7MEQ) TBCR SR tablet, Take 84 mg by mouth., Disp: , Rfl:  .  metFORMIN (GLUCOPHAGE) 1000 MG tablet, Take 1,000 mg by mouth 2 (two) times daily with a meal., Disp: , Rfl:  .  Multiple Vitamin (MULTIVITAMIN) capsule, Take 1 capsule by mouth daily. , Disp: , Rfl:  .  Omega-3 Fatty Acids (FISH OIL) 1000 MG CPDR, Take 1 capsule by mouth 2 (two) times daily. , Disp: , Rfl:  .  pantoprazole (PROTONIX) 40 MG tablet, Take 40 mg by mouth daily as needed. , Disp: , Rfl:  .  Polyethyl Glycol-Propyl Glycol (SYSTANE ULTRA OP), Place 1 drop into both eyes daily as needed (dry eyes)., Disp: ,  Rfl:  .  pravastatin (PRAVACHOL) 20 MG tablet, Take 20 mg by mouth at bedtime. , Disp: , Rfl:  .  vitamin B-12 (CYANOCOBALAMIN) 1000 MCG tablet, Take 1,000 mcg by mouth daily., Disp: , Rfl:   Allergies  Allergen Reactions  . Iodinated Diagnostic Agents Anaphylaxis  . Amlodipine     Swelling/'foggy sensation'  . Avelox [Moxifloxacin Hcl]     Weakness and felt bad  . Doxazosin     Explosive diarrhea   . Hydralazine     anxiety  . Iodine   . Trimethoprim Other (See Comments) and Diarrhea    Extreme diarrhea     Objective:  Vascular Examination:  Capillary refill time <3 seconds x 10 digits.  Dorsalis pedis pulses palpable b/l.  Posterior tibial pulses palpable b/l  Digital hair absent  x 10 digits.  Skin temperature gradient WNL b/l.  Dermatological Examination: Skin with normal turgor, texture and tone b/l.  Toenails 1-5 b/l discolored, thick, dystrophic with subungual debris and pain with palpation to nailbeds due to thickness of nails.  Hyperkeratotic lesions medial plantar arch b/l. No erythema, no edema, no drainage, no flocculence noted.   Musculoskeletal: Muscle strength 5/5 to all LE muscle groups  Neurological: Sensation intact with 10 gram monofilament.  Assessment: 1. Painful onychomycosis toenails 1-5 b/l 2.  Calluses b/l plantar arch 3.  NIDDM  Plan: 1. Continue diabetic foot care principles. Literature dispensed on today. 2. Toenails 1-5 b/l were debrided in length and girth without iatrogenic bleeding. 3. Calluses pared b/l plantar medial arch with sterile scalpel blade and filed without incident. 4. Patient to continue soft, supportive shoe gear daily. 5. Patient to report any pedal injuries to medical professional immediately. 6. Follow up 9 weeks.  7. Patient/POA to call should there be a concern in the interim.

## 2019-04-09 DIAGNOSIS — N202 Calculus of kidney with calculus of ureter: Secondary | ICD-10-CM | POA: Diagnosis not present

## 2019-04-09 DIAGNOSIS — R3121 Asymptomatic microscopic hematuria: Secondary | ICD-10-CM | POA: Diagnosis not present

## 2019-04-23 DIAGNOSIS — N201 Calculus of ureter: Secondary | ICD-10-CM | POA: Diagnosis not present

## 2019-04-23 DIAGNOSIS — N3020 Other chronic cystitis without hematuria: Secondary | ICD-10-CM | POA: Diagnosis not present

## 2019-04-25 DIAGNOSIS — E1149 Type 2 diabetes mellitus with other diabetic neurological complication: Secondary | ICD-10-CM | POA: Diagnosis not present

## 2019-04-25 DIAGNOSIS — E782 Mixed hyperlipidemia: Secondary | ICD-10-CM | POA: Diagnosis not present

## 2019-04-25 DIAGNOSIS — N189 Chronic kidney disease, unspecified: Secondary | ICD-10-CM | POA: Diagnosis not present

## 2019-04-29 DIAGNOSIS — K529 Noninfective gastroenteritis and colitis, unspecified: Secondary | ICD-10-CM | POA: Diagnosis not present

## 2019-04-29 DIAGNOSIS — E1149 Type 2 diabetes mellitus with other diabetic neurological complication: Secondary | ICD-10-CM | POA: Diagnosis not present

## 2019-04-29 DIAGNOSIS — I10 Essential (primary) hypertension: Secondary | ICD-10-CM | POA: Diagnosis not present

## 2019-05-06 ENCOUNTER — Ambulatory Visit (INDEPENDENT_AMBULATORY_CARE_PROVIDER_SITE_OTHER): Payer: Medicare Other | Admitting: Pulmonary Disease

## 2019-05-06 ENCOUNTER — Encounter: Payer: Self-pay | Admitting: Pulmonary Disease

## 2019-05-06 DIAGNOSIS — E668 Other obesity: Secondary | ICD-10-CM | POA: Diagnosis not present

## 2019-05-06 DIAGNOSIS — I10 Essential (primary) hypertension: Secondary | ICD-10-CM | POA: Diagnosis not present

## 2019-05-06 DIAGNOSIS — G4733 Obstructive sleep apnea (adult) (pediatric): Secondary | ICD-10-CM | POA: Diagnosis not present

## 2019-05-06 NOTE — Patient Instructions (Signed)
Continue CPAP with sleep Continue excellent efforts on weight loss Follow-up in 1 year.  Call sooner if needed

## 2019-05-06 NOTE — Progress Notes (Signed)
PULMONARY/SLEEP OFFICE FOLLOW UP NOTE  Requesting MD/Service: Ignacia Bayley, PA Date of initial consultation: 12/19/17 Reason for consultation: Severe hypertension, rule out OSA  PT PROFILE: 78 y.o. female never smoker referred to rule out OSA due to refractory hypertension  DATA: PSG 01/02/18: mod-severe OSA. RDI 37.4. Autoset 5-20 recommended Compliance 02/25-03/26/19: 30/30 nights.  Compliance 09/03-10/02/19: 30/30 nights. Mean usage 7.2 hrs. Median pressure 19.7 mmHg. AHI 19.4 Compliance 04/18-05/17/20: 30/30 nights. Mean usage: 7.0 hrs. Median pressure 9.3 cm H2O. AHI 10/hr  Virtual Visit via Telephone Note I connected with Bijal D Chieffo on 05/06/19 at 10:15 AM EDT by telephone and verified that I am speaking with the correct person using two identifiers. I discussed the limitations, risks, security and privacy concerns of performing an evaluation and management service by telephone and the availability of in person appointments. I also discussed with the patient that there may be a patient responsible charge related to this service. The patient expressed understanding and agreed to proceed.   SUBJ:  Last visit 09/24/18.  This is a virtual telephone visit in the setting of the coronavirus pandemic to follow-up on sleep apnea which was diagnosed on the basis of refractory hypertension.  Since last visit, she has worked hard on weight loss by reducing simple carbohydrates and reducing caloric intake and liquids.  She has lost 19 pounds.  She is compliant with CPAP.  Compliance report as documented above.  She denies morning headache, daytime hypersomnolence.  She feels well rested upon awakening.  She is tolerating CPAP much better.  Her blood pressure control is improved.  There were no vitals filed for this visit.    EXAM:  Due to the remote nature of this encounter, no physical examination could be performed   DATA:   BMP Latest Ref Rng & Units 07/10/2018 01/16/2018 12/14/2017   Glucose 70 - 99 mg/dL 135(H) 176(H) 143(H)  BUN 8 - 23 mg/dL 15 24 19   Creatinine 0.44 - 1.00 mg/dL 0.85 1.09(H) 0.81  BUN/Creat Ratio 12 - 28 - 22 23  Sodium 135 - 145 mmol/L 143 145(H) 143  Potassium 3.5 - 5.1 mmol/L 4.5 4.3 4.5  Chloride 98 - 111 mmol/L 109 107(H) 106  CO2 22 - 32 mmol/L 24 23 22   Calcium 8.9 - 10.3 mg/dL 10.0 10.3 9.6    CBC Latest Ref Rng & Units 03/21/2017 06/26/2007  WBC 4.0 - 10.5 K/uL 7.1 -  Hemoglobin 12.0 - 15.0 g/dL 11.4(L) 10.2(L)  Hematocrit 36.0 - 46.0 % 33.8(L) -  Platelets 150 - 400 K/uL 214 -    CXR: No recent films  IMPRESSION:   1) severe hypertension, now well controlled 2) Mod-severe OSA on AutoSet CPAP - tolerating well, compliant with it, benefiting from it  3) Moderate obesity with continued excellent wt loss efforts  I am concerned that her sleep apnea still appears to be inadequately controlled based on the compliance report.  We discussed the possibility of performing lab titration study.  She wishes to forego this for now.  PLAN:  Continue CPAP with sleep Continue her excellent efforts at weight loss.  Follow-up in 1 year.  Call sooner if needed   Merton Border, MD PCCM service Mobile [phone] Pager [phone] 05/06/2019 10:26 AM

## 2019-05-15 DIAGNOSIS — M9902 Segmental and somatic dysfunction of thoracic region: Secondary | ICD-10-CM | POA: Diagnosis not present

## 2019-05-15 DIAGNOSIS — M4306 Spondylolysis, lumbar region: Secondary | ICD-10-CM | POA: Diagnosis not present

## 2019-05-15 DIAGNOSIS — M4304 Spondylolysis, thoracic region: Secondary | ICD-10-CM | POA: Diagnosis not present

## 2019-05-15 DIAGNOSIS — M9903 Segmental and somatic dysfunction of lumbar region: Secondary | ICD-10-CM | POA: Diagnosis not present

## 2019-05-15 DIAGNOSIS — M5388 Other specified dorsopathies, sacral and sacrococcygeal region: Secondary | ICD-10-CM | POA: Diagnosis not present

## 2019-05-15 DIAGNOSIS — M9904 Segmental and somatic dysfunction of sacral region: Secondary | ICD-10-CM | POA: Diagnosis not present

## 2019-05-20 DIAGNOSIS — M4304 Spondylolysis, thoracic region: Secondary | ICD-10-CM | POA: Diagnosis not present

## 2019-05-20 DIAGNOSIS — M9903 Segmental and somatic dysfunction of lumbar region: Secondary | ICD-10-CM | POA: Diagnosis not present

## 2019-05-20 DIAGNOSIS — M4306 Spondylolysis, lumbar region: Secondary | ICD-10-CM | POA: Diagnosis not present

## 2019-05-20 DIAGNOSIS — M5388 Other specified dorsopathies, sacral and sacrococcygeal region: Secondary | ICD-10-CM | POA: Diagnosis not present

## 2019-05-20 DIAGNOSIS — M9904 Segmental and somatic dysfunction of sacral region: Secondary | ICD-10-CM | POA: Diagnosis not present

## 2019-05-20 DIAGNOSIS — M9902 Segmental and somatic dysfunction of thoracic region: Secondary | ICD-10-CM | POA: Diagnosis not present

## 2019-05-21 DIAGNOSIS — N813 Complete uterovaginal prolapse: Secondary | ICD-10-CM | POA: Diagnosis not present

## 2019-06-06 DIAGNOSIS — N814 Uterovaginal prolapse, unspecified: Secondary | ICD-10-CM | POA: Diagnosis not present

## 2019-06-06 DIAGNOSIS — Z4689 Encounter for fitting and adjustment of other specified devices: Secondary | ICD-10-CM | POA: Diagnosis not present

## 2019-06-06 DIAGNOSIS — R350 Frequency of micturition: Secondary | ICD-10-CM | POA: Diagnosis not present

## 2019-06-06 DIAGNOSIS — N3946 Mixed incontinence: Secondary | ICD-10-CM | POA: Diagnosis not present

## 2019-06-12 DIAGNOSIS — N200 Calculus of kidney: Secondary | ICD-10-CM | POA: Diagnosis not present

## 2019-06-12 DIAGNOSIS — N3946 Mixed incontinence: Secondary | ICD-10-CM | POA: Diagnosis not present

## 2019-07-03 DIAGNOSIS — I10 Essential (primary) hypertension: Secondary | ICD-10-CM | POA: Diagnosis not present

## 2019-07-03 DIAGNOSIS — R42 Dizziness and giddiness: Secondary | ICD-10-CM | POA: Diagnosis not present

## 2019-07-03 DIAGNOSIS — H81392 Other peripheral vertigo, left ear: Secondary | ICD-10-CM | POA: Diagnosis not present

## 2019-07-10 DIAGNOSIS — H81392 Other peripheral vertigo, left ear: Secondary | ICD-10-CM | POA: Diagnosis not present

## 2019-07-10 DIAGNOSIS — R42 Dizziness and giddiness: Secondary | ICD-10-CM | POA: Diagnosis not present

## 2019-07-11 ENCOUNTER — Ambulatory Visit (INDEPENDENT_AMBULATORY_CARE_PROVIDER_SITE_OTHER): Payer: Medicare Other | Admitting: Podiatry

## 2019-07-11 ENCOUNTER — Other Ambulatory Visit: Payer: Self-pay

## 2019-07-11 DIAGNOSIS — M79675 Pain in left toe(s): Secondary | ICD-10-CM | POA: Diagnosis not present

## 2019-07-11 DIAGNOSIS — M779 Enthesopathy, unspecified: Secondary | ICD-10-CM | POA: Diagnosis not present

## 2019-07-11 DIAGNOSIS — B351 Tinea unguium: Secondary | ICD-10-CM | POA: Diagnosis not present

## 2019-07-11 DIAGNOSIS — M79674 Pain in right toe(s): Secondary | ICD-10-CM

## 2019-07-16 NOTE — Progress Notes (Signed)
Subjective: 78 year old female presents the office today for concerns of pain to the left ankle she is requesting a steroid injection.  She denies any recent injury or falls.  The swelling to her legs are doing better than it has previously.  She has no other concerns.  She also states that her nails are thickened elongated she cannot trim them herself.  No redness or drainage of the toenail sites.  Denies any systemic complaints such as fevers, chills, nausea, vomiting. No acute changes since last appointment, and no other complaints at this time.   Objective: AAO x3, NAD DP/PT pulses palpable bilaterally, CRT less than 3 seconds There is tenderness on the left foot lateral aspect of the sinus tarsi.  Flatfoot deformities present.  Nails are hypertrophic, dystrophic, brittle, discolored, elongated 10. No surrounding redness or drainage. Tenderness nails 1-5 bilaterally. No open lesions or pre-ulcerative lesions are identified today. No open lesions or pre-ulcerative lesions.  No pain with calf compression, swelling, warmth, erythema  Assessment: Sinus tarsi, capsulitis; symptomatic onychomycosis  Plan: -All treatment options discussed with the patient including all alternatives, risks, complications.  -Steroid injection to the left sinus tarsi.  See procedure note below -Nails debrided x2 applications -Patient encouraged to call the office with any questions, concerns, change in symptoms.   Procedure: Injection intermediate joint Discussed alternatives, risks, complications and verbal consent was obtained.  Location: Left sinus tarsi Skin Prep: Betadine Injectate: 0.5cc 0.5% marcaine plain, 0.5 cc 2% lidocaine plain and, 1 cc kenalog 10. Disposition: Patient tolerated procedure well. Injection site dressed with a band-aid.  Post-injection care was discussed and return precautions discussed.   Trula Slade DPM

## 2019-07-21 DIAGNOSIS — R42 Dizziness and giddiness: Secondary | ICD-10-CM | POA: Diagnosis not present

## 2019-07-21 DIAGNOSIS — H81392 Other peripheral vertigo, left ear: Secondary | ICD-10-CM | POA: Diagnosis not present

## 2019-08-01 DIAGNOSIS — N189 Chronic kidney disease, unspecified: Secondary | ICD-10-CM | POA: Diagnosis not present

## 2019-08-01 DIAGNOSIS — E1149 Type 2 diabetes mellitus with other diabetic neurological complication: Secondary | ICD-10-CM | POA: Diagnosis not present

## 2019-08-01 DIAGNOSIS — E782 Mixed hyperlipidemia: Secondary | ICD-10-CM | POA: Diagnosis not present

## 2019-08-04 DIAGNOSIS — N189 Chronic kidney disease, unspecified: Secondary | ICD-10-CM | POA: Diagnosis not present

## 2019-08-04 DIAGNOSIS — K529 Noninfective gastroenteritis and colitis, unspecified: Secondary | ICD-10-CM | POA: Diagnosis not present

## 2019-08-04 DIAGNOSIS — E1149 Type 2 diabetes mellitus with other diabetic neurological complication: Secondary | ICD-10-CM | POA: Diagnosis not present

## 2019-08-11 DIAGNOSIS — H8111 Benign paroxysmal vertigo, right ear: Secondary | ICD-10-CM | POA: Diagnosis not present

## 2019-08-11 DIAGNOSIS — R42 Dizziness and giddiness: Secondary | ICD-10-CM | POA: Diagnosis not present

## 2019-08-13 DIAGNOSIS — R42 Dizziness and giddiness: Secondary | ICD-10-CM | POA: Diagnosis not present

## 2019-08-13 DIAGNOSIS — H8111 Benign paroxysmal vertigo, right ear: Secondary | ICD-10-CM | POA: Diagnosis not present

## 2019-08-19 DIAGNOSIS — L578 Other skin changes due to chronic exposure to nonionizing radiation: Secondary | ICD-10-CM | POA: Diagnosis not present

## 2019-08-19 DIAGNOSIS — D485 Neoplasm of uncertain behavior of skin: Secondary | ICD-10-CM | POA: Diagnosis not present

## 2019-08-19 DIAGNOSIS — H8111 Benign paroxysmal vertigo, right ear: Secondary | ICD-10-CM | POA: Diagnosis not present

## 2019-08-19 DIAGNOSIS — R42 Dizziness and giddiness: Secondary | ICD-10-CM | POA: Diagnosis not present

## 2019-08-19 DIAGNOSIS — R233 Spontaneous ecchymoses: Secondary | ICD-10-CM | POA: Diagnosis not present

## 2019-08-26 DIAGNOSIS — Z1231 Encounter for screening mammogram for malignant neoplasm of breast: Secondary | ICD-10-CM | POA: Diagnosis not present

## 2019-09-11 DIAGNOSIS — Z9181 History of falling: Secondary | ICD-10-CM | POA: Diagnosis not present

## 2019-09-11 DIAGNOSIS — Z1331 Encounter for screening for depression: Secondary | ICD-10-CM | POA: Diagnosis not present

## 2019-09-11 DIAGNOSIS — N959 Unspecified menopausal and perimenopausal disorder: Secondary | ICD-10-CM | POA: Diagnosis not present

## 2019-09-11 DIAGNOSIS — E785 Hyperlipidemia, unspecified: Secondary | ICD-10-CM | POA: Diagnosis not present

## 2019-09-11 DIAGNOSIS — Z139 Encounter for screening, unspecified: Secondary | ICD-10-CM | POA: Diagnosis not present

## 2019-09-11 DIAGNOSIS — Z0000 Encounter for general adult medical examination without abnormal findings: Secondary | ICD-10-CM | POA: Diagnosis not present

## 2019-09-15 DIAGNOSIS — Z23 Encounter for immunization: Secondary | ICD-10-CM | POA: Diagnosis not present

## 2019-10-10 DIAGNOSIS — L578 Other skin changes due to chronic exposure to nonionizing radiation: Secondary | ICD-10-CM | POA: Diagnosis not present

## 2019-10-10 DIAGNOSIS — D225 Melanocytic nevi of trunk: Secondary | ICD-10-CM | POA: Diagnosis not present

## 2019-10-10 DIAGNOSIS — L821 Other seborrheic keratosis: Secondary | ICD-10-CM | POA: Diagnosis not present

## 2019-10-10 DIAGNOSIS — L814 Other melanin hyperpigmentation: Secondary | ICD-10-CM | POA: Diagnosis not present

## 2019-10-10 DIAGNOSIS — L820 Inflamed seborrheic keratosis: Secondary | ICD-10-CM | POA: Diagnosis not present

## 2019-10-20 DIAGNOSIS — E119 Type 2 diabetes mellitus without complications: Secondary | ICD-10-CM | POA: Diagnosis not present

## 2019-10-24 ENCOUNTER — Ambulatory Visit: Payer: Medicare Other | Admitting: Nurse Practitioner

## 2019-10-31 ENCOUNTER — Other Ambulatory Visit: Payer: Self-pay

## 2019-10-31 ENCOUNTER — Encounter: Payer: Self-pay | Admitting: Nurse Practitioner

## 2019-10-31 ENCOUNTER — Ambulatory Visit (INDEPENDENT_AMBULATORY_CARE_PROVIDER_SITE_OTHER): Payer: Medicare Other | Admitting: Nurse Practitioner

## 2019-10-31 VITALS — BP 180/70 | HR 58 | Temp 97.7°F | Ht 61.5 in | Wt 160.5 lb

## 2019-10-31 DIAGNOSIS — E782 Mixed hyperlipidemia: Secondary | ICD-10-CM

## 2019-10-31 DIAGNOSIS — I10 Essential (primary) hypertension: Secondary | ICD-10-CM | POA: Diagnosis not present

## 2019-10-31 DIAGNOSIS — N182 Chronic kidney disease, stage 2 (mild): Secondary | ICD-10-CM

## 2019-10-31 NOTE — Patient Instructions (Signed)
Medication Instructions:  Your physician recommends that you continue on your current medications as directed. Please refer to the Current Medication list given to you today.  *If you need a refill on your cardiac medications before your next appointment, please call your pharmacy*  Lab Work: None ordered  If you have labs (blood work) drawn today and your tests are completely normal, you will receive your results only by: Marland Kitchen MyChart Message (if you have MyChart) OR . A paper copy in the mail If you have any lab test that is abnormal or we need to change your treatment, we will call you to review the results.  Testing/Procedures: None ordered   Follow-Up: At [facility], you and your health needs are our priority.  As part of our continuing mission to provide you with exceptional heart care, we have created designated Provider Care Teams.  These Care Teams include your primary Cardiologist (physician) and Advanced Practice Providers (APPs -  Physician Assistants and Nurse Practitioners) who all work together to provide you with the care you need, when you need it.  Your next appointment:   12 months  The format for your next appointment:   In Person  Provider:    You may see Nelva Bush, MD or one of the following Advanced Practice Providers on your designated Care Team:    Murray Hodgkins, NP  Christell Faith, PA-C  Marrianne Mood, PA-C   Other Instructions Please call the office Monday with BP readings.

## 2019-10-31 NOTE — Progress Notes (Signed)
Office Visit    Patient Name: Laura Garner Date of Encounter: 10/31/2019  Primary Care Provider:  Philmore Pali, NP Primary Cardiologist:  Nelva Bush, MD  Chief Complaint    78 year old female with a history of difficult to control hypertension, hyperlipidemia, type 2 diabetes mellitus, stage II chronic kidney disease, obesity, and GERD, who presents for follow-up related to hypertension.  Past Medical History    Past Medical History:  Diagnosis Date   Anemia    Anxiety    no meds   Arthritis    neck - otc med prn   Chest pain    a. 10/2017 MV: EF 55-65%, no ischemia, infarct.   Diabetes mellitus    type 2   Diastolic dysfunction    a. 10/2017 Echo: EF 55-60%, no rwma, Gr1 DD, mod dil LA, PASP 34mmHg.   GERD (gastroesophageal reflux disease)    Heart palpitations    hx - caused by medication   History of kidney stones    multiple kidney stones -passed some stones and surgery x 3    Hyperlipidemia    Hypertension    a. Difficult to control-->10/2017 Renal Duplex: < 60% bilat RAS.   Left breast mass    PAH (pulmonary artery hypertension) ([place])    a. 10/2017 Echo: PASP 39mmHg.   RBBB    Reflux    Seasonal allergies    Sleep apnea    wears CPAP nightly   SVD (spontaneous vaginal delivery)    x 3   Past Surgical History:  Procedure Laterality Date   BREAST LUMPECTOMY WITH RADIOACTIVE SEED LOCALIZATION Left 07/19/2018   Procedure: LEFT BREAST LUMPECTOMY WITH RADIOACTIVE SEED LOCALIZATION ERAS PATHWAY;  Surgeon: Jovita Kussmaul, MD;  Location: [facility];  Service: General;  Laterality: Left;   HYSTEROSCOPY W/D&C N/A 03/22/2017   Procedure: DILATATION AND CURETTAGE /HYSTEROSCOPY;  Surgeon: Newton Pigg, MD;  Location: [facility];  Service: Gynecology;  Laterality: N/A;   lithrotripsy     surgery x 3   right foot surgery     repair tendon   SHOULDER SURGERY     x 2 - right and left    Allergies  Allergies  Allergen  Reactions   Iodinated Diagnostic Agents Anaphylaxis   Amlodipine     Swelling/'foggy sensation'   Avelox [Moxifloxacin Hcl]     Weakness and felt bad   Doxazosin     Explosive diarrhea    Hydralazine     anxiety   Iodine    Trimethoprim Other (See Comments) and Diarrhea    Extreme diarrhea     History of Present Illness    78 year old female with the above complex past medical history including difficult to control hypertension, hyperlipidemia, diabetes, stage II chronic kidney disease, obesity, and GERD.  She has had multiple medication intolerances in the setting of trying to get her blood pressure down, including amlodipine, hydralazine, HCTZ, doxazosin, and lisinopril.  In November 2018, she underwent work-up for secondary hypertension and was found to have a normal serum aldosterone/plasma renin activity, no significant renal artery stenosis on renal duplex, and normal LV function on echocardiogram.  Stress testing was undertaken and was nonischemic.  She was placed on chlorthalidone in December 2018 with subsequent significant improvement in blood pressure.    She was last seen in clinic in October 2019 at which time she reported significant improvement in activity tolerance. Over the past year, she has continued to do well.  She  rides her exercise bicycle for 5 miles daily, which usually takes her about 30 minutes.  In that setting, she has lost 31 pounds since her last visit here, and 55 pounds since November 2018.  With weight loss, she has seen her hemoglobin A1c improved to 6.4 and she is now off of Metformin.  Her blood pressure at home averages about 130/70 however, she says she suffers from whitecoat syndrome and it is very common for her to have elevated pressures when seen by her providers.  Her blood pressure today is 180/70.  She did not take her chlorthalidone yet this morning.  She denies chest pain, palpitations, dyspnea, PND, orthopnea, dizziness, syncope, edema, or  early satiety.  She has bilateral ankle pain, which causes some difficulty with ambulation but is overall stable.  Home Medications    Prior to Admission medications   Medication Sig Start Date End Date Taking? Authorizing Provider  aspirin EC 81 MG tablet Take 81 mg by mouth daily.    [provider]  Calcium Carbonate-Vit D-Min (CALTRATE PLUS PO) Take 1 tablet by mouth daily.     [provider]  carvedilol (COREG) 12.5 MG tablet Take 12.5 mg by mouth 2 (two) times daily with a meal.    [provider]  chlorthalidone (HYGROTON) 25 MG tablet Take 0.5 tablets (12.5 mg total) by mouth daily. 12/03/18 03/03/19  Theora Gianotti, NP  cloNIDine (CATAPRES - DOSED IN MG/24 HR) 0.1 mg/24hr patch APPLY 1 PATCH TOPICALLY ONCE A WEEK 10/14/18   [provider]  ferrous sulfate 325 (65 FE) MG tablet Take 325 mg by mouth daily with breakfast.    [provider]  loratadine (CLARITIN) 10 MG tablet Take 10 mg by mouth daily.    [provider]  losartan (COZAAR) 100 MG tablet Take 100 mg by mouth at bedtime.     [provider]  magnesium (MAGTAB) 84 MG (7MEQ) TBCR SR tablet Take 84 mg by mouth.    [provider]  metFORMIN (GLUCOPHAGE) 1000 MG tablet Take 1,000 mg by mouth 2 (two) times daily with a meal.    [provider]  Multiple Vitamin (MULTIVITAMIN) capsule Take 1 capsule by mouth daily.     [provider]  Omega-3 Fatty Acids (FISH OIL) 1000 MG CPDR Take 1 capsule by mouth 2 (two) times daily.     [provider]  pantoprazole (PROTONIX) 40 MG tablet Take 40 mg by mouth daily as needed.  05/31/15   [provider]  Polyethyl Glycol-Propyl Glycol (SYSTANE ULTRA OP) Place 1 drop into both eyes daily as needed (dry eyes).    [provider]  pravastatin (PRAVACHOL) 20 MG tablet Take 20 mg by mouth at bedtime.     [provider]  vitamin B-12 (CYANOCOBALAMIN) 1000 MCG  tablet Take 1,000 mcg by mouth daily.    [provider]    Review of Systems    She denies chest pain, palpitations, dyspnea, pnd, orthopnea, n, v, dizziness, syncope, edema, weight gain, or early satiety.  Chronic bilateral ankle and foot pain.  All other systems reviewed and are otherwise negative except as noted above.  Physical Exam    VS:  BP (!) 180/70 (BP Location: Left Arm, Patient Position: Sitting, Cuff Size: Normal)    Pulse (!) 58    Temp 97.7 F (36.5 C)    Ht 5' 1.5" (1.562 m)    Wt 160 lb 8 oz (72.8 kg)  BMI 29.84 kg/m  , BMI Body mass index is 29.84 kg/m. GEN: Well nourished, well developed, in no acute distress. HEENT: normal. Neck: Supple, no JVD, carotid bruits, or masses. Cardiac: RRR, no murmurs, rubs, or gallops. No clubbing, cyanosis, trace bilateral ankle edema.  Radials/PT 2+ and equal bilaterally.  Respiratory:  Respirations regular and unlabored, clear to auscultation bilaterally. GI: Soft, nontender, nondistended, BS + x 4. MS: no deformity or atrophy. Skin: warm and dry, no rash. Neuro:  Strength and sensation are intact. Psych: Normal affect.  Accessory Clinical Findings    ECG personally reviewed by me today -sinus bradycardia, 58, left axis deviation, right bundle branch block, LVH - no acute changes.  Lab Results  Component Value Date   WBC 7.1 03/21/2017   HGB 11.4 (L) 03/21/2017   HCT 33.8 (L) 03/21/2017   MCV 96.8 03/21/2017   PLT 214 03/21/2017   Lab Results  Component Value Date   CREATININE 0.85 07/10/2018   BUN 15 07/10/2018   NA 143 07/10/2018   K 4.5 07/10/2018   CL 109 07/10/2018   CO2 24 07/10/2018    Assessment & Plan    1.  Essential hypertension: She checks her blood pressure regularly at home and says that she averages about 130/70 however, she continues to note whitecoat hypertension.  Blood pressure today is 180/70.  I did repeat this and got similar.  She did not yet take her chlorthalidone this morning as  she did not want to have to urinate during travel.  I encouraged her to go home and take her chlorthalidone and then follow-up blood pressure later today and over the course of the weekend.  She has tested her home wrist blood pressure cuff against her primary care providers cuff and her wrist cuff was accurate.  I asked her to call us on Monday with some numbers.  She is currently taking chlorthalidone 12.5 mg, clonidine patch 0.1 mg (due to be changed tomorrow), carvedilol 12.5 mg twice daily, and losartan 100 mg daily.  If pressures over the weekend are elevated, I would plan to increase her chlorthalidone to 25 mg daily.  2.  Hyperlipidemia: On pravastatin.  Labs are followed by primary care.  3.  Stage II chronic kidney disease: She says she had labs in August which were stable.  4.  History of type 2 diabetes mellitus: Currently diet-controlled and off of Metformin.  In the setting of significant weight loss over the past 3 years.  5.  Disposition: Patient to go home and take chlorthalidone and follow-up blood pressure today and this weekend and call us on Monday.  If pressures remain stable at home, she can follow-up in a year.  If we need to adjust her regimen, I would plan to see her back in a month.  Murray Hodgkins, NP 10/31/2019, 10:59 AM

## 2019-11-03 ENCOUNTER — Telehealth: Payer: Self-pay | Admitting: Nurse Practitioner

## 2019-11-03 NOTE — Telephone Encounter (Signed)
Thank you for the update.  Blood pressures are certainly trending better at home than what we saw in the office, though she hadn't yet taken her chlorthalidone that day.  No change to therapy and keep up the great work!

## 2019-11-03 NOTE — Telephone Encounter (Signed)
Reviewed advice of Ignacia Bayley, NP with patient. She states she is relieved that her BP improves with medication and no further treatment is needed at this time. She verbalized understanding and thanked me for the call.

## 2019-11-03 NOTE — Telephone Encounter (Signed)
Pt c/o BP issue: STAT if pt c/o blurred vision, one-sided weakness or slurred speech  1. What are your last 5 BP readings?  11/13-189/70 after her visit here in our office, 2 pm 155/82 9:30 pm 118/55 11/14- 10:45 am 137/70 11/15- 11am 128/67,  11/16-133/67  2. Are you having any other symptoms (ex. Dizziness, headache, blurred vision, passed out)? No symptoms  3. What is your BP issue? Just calling to give readings

## 2019-11-10 ENCOUNTER — Other Ambulatory Visit: Payer: Self-pay

## 2019-11-10 ENCOUNTER — Ambulatory Visit (INDEPENDENT_AMBULATORY_CARE_PROVIDER_SITE_OTHER): Payer: Medicare Other | Admitting: Podiatry

## 2019-11-10 DIAGNOSIS — M79675 Pain in left toe(s): Secondary | ICD-10-CM

## 2019-11-10 DIAGNOSIS — M79674 Pain in right toe(s): Secondary | ICD-10-CM

## 2019-11-10 DIAGNOSIS — B351 Tinea unguium: Secondary | ICD-10-CM | POA: Diagnosis not present

## 2019-11-10 DIAGNOSIS — M779 Enthesopathy, unspecified: Secondary | ICD-10-CM

## 2019-11-10 MED ORDER — DICLOFENAC SODIUM 1 % EX GEL: 2.0000 g | Freq: Four times a day (QID) | CUTANEOUS | 2 refills | Status: DC

## 2019-11-10 NOTE — Patient Instructions (Signed)
For instructions on how to put on your Night Splint, please visit [email or web address]  

## 2019-11-17 NOTE — Progress Notes (Signed)
Subjective: 78 year old female presents the office today for concerns of bilateral foot pain with right side worse than left.  She states that she has pain today more in the medial aspect the foot that she points to.  Where she had the injection site previously on the outside aspect is been doing well.  She feels this started to become more painful as she has been out and doing more and becoming more active.  She states that she has been working outside and she has been on a hill at an incline and this seems to aggravate her symptoms.  Denies any specific injury.  No swelling.  Also she is asking the nails be trimmed today.  No redness or drainage to the toenail sites. Denies any systemic complaints such as fevers, chills, nausea, vomiting. No acute changes since last appointment, and no other complaints at this time.   Objective: AAO x3, NAD DP/PT pulses palpable bilaterally, CRT less than 3 seconds Significant flatfoot is present.  There is no tenderness to the lateral aspect of the foot on sinus tarsi.  Majority of tenderness is on the medial aspect of foot she points along the navicular tuberosity and along the medial arch of the foot.  There is no specific area of pinpoint tenderness.  Flexor, extensor tendons appear to be intact.  No open lesions or pre-ulcerative lesions.  Nails are hypertrophic, dystrophic, brittle, discolored, elongated 10. No surrounding redness or drainage. Tenderness nails 1-5 bilaterally. No open lesions or pre-ulcerative lesions are identified today. No pain with calf compression, swelling, warmth, erythema  Assessment: 78 year old female with bilateral foot pain; symptomatic onychomycosis  Plan: -All treatment options discussed with the patient including all alternatives, risks, complications.  -For bilateral foot pain recommended Voltaren gel daily.  There is no specific area of pinpoint tenderness.  If needed can try steroid injection but as is not significant  discomfort and some more diffuse will order for this today.  I think that from her being on the incline wearing the brace it is causing more pressure to the medial foot.  We discussed gradually increase activity level and plan on taking breaks particular when outside on uneven surfaces. -Nails debrided x2 without any complications or bleeding -Patient encouraged to call the office with any questions, concerns, change in symptoms.   Return in about 3 months (around 02/10/2020).  Trula Slade DPM

## 2020-01-07 DIAGNOSIS — M4304 Spondylolysis, thoracic region: Secondary | ICD-10-CM | POA: Diagnosis not present

## 2020-01-07 DIAGNOSIS — M4307 Spondylolysis, lumbosacral region: Secondary | ICD-10-CM | POA: Diagnosis not present

## 2020-01-07 DIAGNOSIS — M9902 Segmental and somatic dysfunction of thoracic region: Secondary | ICD-10-CM | POA: Diagnosis not present

## 2020-01-07 DIAGNOSIS — M5388 Other specified dorsopathies, sacral and sacrococcygeal region: Secondary | ICD-10-CM | POA: Diagnosis not present

## 2020-01-07 DIAGNOSIS — M9903 Segmental and somatic dysfunction of lumbar region: Secondary | ICD-10-CM | POA: Diagnosis not present

## 2020-01-07 DIAGNOSIS — M9904 Segmental and somatic dysfunction of sacral region: Secondary | ICD-10-CM | POA: Diagnosis not present

## 2020-01-12 DIAGNOSIS — R3121 Asymptomatic microscopic hematuria: Secondary | ICD-10-CM | POA: Diagnosis not present

## 2020-01-12 DIAGNOSIS — N3946 Mixed incontinence: Secondary | ICD-10-CM | POA: Diagnosis not present

## 2020-01-12 DIAGNOSIS — N200 Calculus of kidney: Secondary | ICD-10-CM | POA: Diagnosis not present

## 2020-02-16 ENCOUNTER — Other Ambulatory Visit: Payer: Self-pay

## 2020-02-16 ENCOUNTER — Ambulatory Visit (INDEPENDENT_AMBULATORY_CARE_PROVIDER_SITE_OTHER): Payer: Medicare Other | Admitting: Podiatry

## 2020-02-16 DIAGNOSIS — Q828 Other specified congenital malformations of skin: Secondary | ICD-10-CM

## 2020-02-16 DIAGNOSIS — M79675 Pain in left toe(s): Secondary | ICD-10-CM

## 2020-02-16 DIAGNOSIS — M79674 Pain in right toe(s): Secondary | ICD-10-CM | POA: Diagnosis not present

## 2020-02-16 DIAGNOSIS — B351 Tinea unguium: Secondary | ICD-10-CM

## 2020-02-16 DIAGNOSIS — E119 Type 2 diabetes mellitus without complications: Secondary | ICD-10-CM

## 2020-02-23 NOTE — Progress Notes (Signed)
Subjective: 79 y.o. returns the office today for painful, elongated, thickened toenails which she cannot trim herself as well as for calluses to both of her feet.  She states that her feet been doing well.  No significant pain today.  Denies any recent injury or falls and she has no other concerns.    PCP: Isaias Sakai, DO   Objective: AAO 3, NAD DP/PT pulses palpable, CRT less than 3 seconds Nails hypertrophic, dystrophic, elongated, brittle, discolored 10. There is tenderness overlying the nails 1-5 bilaterally. There is no surrounding erythema or drainage along the nail sites. Hyperkeratotic lesions bilateral medial arch on the area of the talar prominence.  No ongoing ulceration, drainage or any signs of infection. No areas of tenderness today otherwise.  No pain with calf compression, swelling, warmth, erythema.  Assessment: Patient presents with symptomatic onychomycosis, hyperkeratotic lesions Plan: -Treatment options including alternatives, risks, complications were discussed -Nails sharply debrided 10 without complication/bleeding. -Hyperkeratotic lesion sharply debrided x2 without any complications or bleeding. -Continue bracing, stretching/rehab exercises. -Discussed daily foot inspection. If there are any changes, to call the office immediately.  -Follow-up in 9 weeks  or sooner if any problems are to arise. In the meantime, encouraged to call the office with any questions, concerns, changes symptoms.  Celesta Gentile, DPM

## 2020-03-08 ENCOUNTER — Other Ambulatory Visit: Payer: Self-pay | Admitting: Internal Medicine

## 2020-03-08 MED ORDER — CHLORTHALIDONE 25 MG PO TABS: 12.5000 mg | ORAL_TABLET | Freq: Every day | ORAL | 2 refills | Status: DC

## 2020-03-08 NOTE — Telephone Encounter (Signed)
*  STAT* If patient is at the pharmacy, call can be transferred to refill team.   1. Which medications need to be refilled? (please list name of each medication and dose if known)  chlorthalidone 25 MG - 0.5 tablets daily   2. Which pharmacy/location (including street and city if local pharmacy) is medication to be sent to? [facility] in [place]   3. Do they need a 30 day or 90 day supply? 90 day

## 2020-03-08 NOTE — Telephone Encounter (Signed)
Requested Prescriptions   Signed Prescriptions Disp Refills   chlorthalidone (HYGROTON) 25 MG tablet 45 tablet 2    Sig: Take 0.5 tablets (12.5 mg total) by mouth daily.    Authorizing Provider: Theora Gianotti    Ordering User: Raelene Bott, Jizel Cheeks L

## 2020-04-05 DIAGNOSIS — N189 Chronic kidney disease, unspecified: Secondary | ICD-10-CM | POA: Diagnosis not present

## 2020-04-05 DIAGNOSIS — E782 Mixed hyperlipidemia: Secondary | ICD-10-CM | POA: Diagnosis not present

## 2020-04-05 DIAGNOSIS — E1149 Type 2 diabetes mellitus with other diabetic neurological complication: Secondary | ICD-10-CM | POA: Diagnosis not present

## 2020-04-07 DIAGNOSIS — N189 Chronic kidney disease, unspecified: Secondary | ICD-10-CM | POA: Diagnosis not present

## 2020-04-07 DIAGNOSIS — K529 Noninfective gastroenteritis and colitis, unspecified: Secondary | ICD-10-CM | POA: Diagnosis not present

## 2020-04-07 DIAGNOSIS — E1149 Type 2 diabetes mellitus with other diabetic neurological complication: Secondary | ICD-10-CM | POA: Diagnosis not present

## 2020-05-26 DIAGNOSIS — M5388 Other specified dorsopathies, sacral and sacrococcygeal region: Secondary | ICD-10-CM | POA: Diagnosis not present

## 2020-05-26 DIAGNOSIS — M9903 Segmental and somatic dysfunction of lumbar region: Secondary | ICD-10-CM | POA: Diagnosis not present

## 2020-05-26 DIAGNOSIS — M9904 Segmental and somatic dysfunction of sacral region: Secondary | ICD-10-CM | POA: Diagnosis not present

## 2020-05-26 DIAGNOSIS — M4304 Spondylolysis, thoracic region: Secondary | ICD-10-CM | POA: Diagnosis not present

## 2020-05-26 DIAGNOSIS — M4307 Spondylolysis, lumbosacral region: Secondary | ICD-10-CM | POA: Diagnosis not present

## 2020-05-26 DIAGNOSIS — M9902 Segmental and somatic dysfunction of thoracic region: Secondary | ICD-10-CM | POA: Diagnosis not present

## 2020-05-31 ENCOUNTER — Other Ambulatory Visit: Payer: Self-pay

## 2020-05-31 ENCOUNTER — Encounter: Payer: Self-pay | Admitting: Podiatry

## 2020-05-31 ENCOUNTER — Ambulatory Visit (INDEPENDENT_AMBULATORY_CARE_PROVIDER_SITE_OTHER): Payer: Medicare Other | Admitting: Podiatry

## 2020-05-31 DIAGNOSIS — E119 Type 2 diabetes mellitus without complications: Secondary | ICD-10-CM

## 2020-05-31 DIAGNOSIS — Q828 Other specified congenital malformations of skin: Secondary | ICD-10-CM

## 2020-05-31 DIAGNOSIS — B351 Tinea unguium: Secondary | ICD-10-CM | POA: Diagnosis not present

## 2020-05-31 DIAGNOSIS — M79674 Pain in right toe(s): Secondary | ICD-10-CM | POA: Diagnosis not present

## 2020-05-31 DIAGNOSIS — M79675 Pain in left toe(s): Secondary | ICD-10-CM | POA: Diagnosis not present

## 2020-06-01 NOTE — Progress Notes (Signed)
Subjective: 79 y.o. returns the office today for painful, elongated, thickened toenails which she cannot trim herself as well as for calluses to both of her feet.  She states that her feet been doing well and no significant pain today.  She states that she does not need any steroid injections.  She gets some occasional discomfort after being on her feet all day but otherwise doing well.  PCP: Isaias Sakai, DO   Objective: AAO 3, NAD DP/PT pulses palpable, CRT less than 3 seconds Nails hypertrophic, dystrophic, elongated, brittle, discolored 10. There is tenderness overlying the nails 1-5 bilaterally. There is no surrounding erythema or drainage along the nail sites. Hyperkeratotic lesions bilateral medial arch on the area of the talar prominence.  No ongoing ulceration, drainage or any signs of infection. No areas of tenderness today otherwise.  No pain with calf compression, swelling, warmth, erythema.  Assessment: Patient presents with symptomatic onychomycosis, hyperkeratotic lesions Plan: -Treatment options including alternatives, risks, complications were discussed -Nails sharply debrided 10 without complication/bleeding. -Hyperkeratotic lesion sharply debrided x2 without any complications or bleeding. -Continue bracing, stretching/rehab exercises.  Discussed icing after being on her feet all day as well which is been helpful. -Discussed daily foot inspection. If there are any changes, to call the office immediately.  -Follow-up in 9 weeks  or sooner if any problems are to arise. In the meantime, encouraged to call the office with any questions, concerns, changes symptoms.  Celesta Gentile, DPM

## 2020-07-16 DIAGNOSIS — M9904 Segmental and somatic dysfunction of sacral region: Secondary | ICD-10-CM | POA: Diagnosis not present

## 2020-07-16 DIAGNOSIS — M5388 Other specified dorsopathies, sacral and sacrococcygeal region: Secondary | ICD-10-CM | POA: Diagnosis not present

## 2020-07-16 DIAGNOSIS — M4307 Spondylolysis, lumbosacral region: Secondary | ICD-10-CM | POA: Diagnosis not present

## 2020-07-16 DIAGNOSIS — M4304 Spondylolysis, thoracic region: Secondary | ICD-10-CM | POA: Diagnosis not present

## 2020-07-16 DIAGNOSIS — M9903 Segmental and somatic dysfunction of lumbar region: Secondary | ICD-10-CM | POA: Diagnosis not present

## 2020-07-16 DIAGNOSIS — M9902 Segmental and somatic dysfunction of thoracic region: Secondary | ICD-10-CM | POA: Diagnosis not present

## 2020-08-02 ENCOUNTER — Ambulatory Visit (INDEPENDENT_AMBULATORY_CARE_PROVIDER_SITE_OTHER): Payer: Medicare Other | Admitting: Podiatry

## 2020-08-02 ENCOUNTER — Encounter: Payer: Self-pay | Admitting: Podiatry

## 2020-08-02 ENCOUNTER — Ambulatory Visit (INDEPENDENT_AMBULATORY_CARE_PROVIDER_SITE_OTHER): Payer: Medicare Other

## 2020-08-02 ENCOUNTER — Other Ambulatory Visit: Payer: Self-pay

## 2020-08-02 DIAGNOSIS — B351 Tinea unguium: Secondary | ICD-10-CM | POA: Diagnosis not present

## 2020-08-02 DIAGNOSIS — E119 Type 2 diabetes mellitus without complications: Secondary | ICD-10-CM | POA: Diagnosis not present

## 2020-08-02 DIAGNOSIS — M79675 Pain in left toe(s): Secondary | ICD-10-CM

## 2020-08-02 DIAGNOSIS — M778 Other enthesopathies, not elsewhere classified: Secondary | ICD-10-CM | POA: Diagnosis not present

## 2020-08-02 DIAGNOSIS — Q828 Other specified congenital malformations of skin: Secondary | ICD-10-CM

## 2020-08-02 DIAGNOSIS — M79674 Pain in right toe(s): Secondary | ICD-10-CM | POA: Diagnosis not present

## 2020-08-02 DIAGNOSIS — M779 Enthesopathy, unspecified: Secondary | ICD-10-CM

## 2020-08-09 NOTE — Progress Notes (Signed)
Subjective: 79 y.o. returns the office today for painful, elongated, thickened toenails which she cannot trim herself as well as for calluses to both of her feet.  She states that about a week ago she did have some increased pain to her feet.  She was not wearing her brace when she was sitting down she stood up and had significant pain with the right side worse than left.  This lasted for couple of days and then resolved but she is asking about steroid injections today.  No recent injuries or falls otherwise.  She gets some occasional discomfort after being on her feet all day but otherwise doing well.  PCP: Isaias Sakai, DO   Objective: AAO 3, NAD DP/PT pulses palpable, CRT less than 3 seconds Nails hypertrophic, dystrophic, elongated, brittle, discolored 10. There is tenderness overlying the nails 1-5 bilaterally. There is no surrounding erythema or drainage along the nail sites. Hyperkeratotic lesions bilateral medial arch on the area of the talar prominence.  No ongoing ulceration, drainage or any signs of infection. Significant flatfoot is present with tenderness on the sinus tarsi bilaterally the right side worse than left.  There is no skin edema, erythema.  No area of pinpoint tenderness.  MMT 5/5. No areas of tenderness today otherwise.  No pain with calf compression, swelling, warmth, erythema.  Assessment: Patient presents with symptomatic onychomycosis, hyperkeratotic lesions; capsulitis  Plan: -Treatment options including alternatives, risks, complications were discussed -X-rays obtained reviewed.  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  No evidence of acute fracture. -Nails sharply debrided 10 without complication/bleeding. -Hyperkeratotic lesion sharply debrided x2 without any complications or bleeding. -Steroid injection performed bilaterally.  See procedure notes below. -Continue bracing, stretching/rehab exercises.  Discussed icing after  being on her feet all day as well which is been helpful. -Discussed daily foot inspection. If there are any changes, to call the office immediately.  -Follow-up in 9 weeks  or sooner if any problems are to arise. In the meantime, encouraged to call the office with any questions, concerns, changes symptoms.  Procedure: Injection Intermediate Joint Discussed alternatives, risks, complications and verbal consent was obtained.  Location: Bilateral sinus tarsi Skin Prep: Betadine. Injectate: 0.5cc 0.5% marcaine plain, 0.5 cc 2% lidocaine plain and, 1 cc kenalog 10. Disposition: Patient tolerated procedure well. Injection site dressed with a band-aid.  Post-injection care was discussed and return precautions discussed.    Celesta Gentile, DPM

## 2020-08-10 DIAGNOSIS — E782 Mixed hyperlipidemia: Secondary | ICD-10-CM | POA: Diagnosis not present

## 2020-08-10 DIAGNOSIS — E1149 Type 2 diabetes mellitus with other diabetic neurological complication: Secondary | ICD-10-CM | POA: Diagnosis not present

## 2020-08-10 DIAGNOSIS — N189 Chronic kidney disease, unspecified: Secondary | ICD-10-CM | POA: Diagnosis not present

## 2020-08-16 DIAGNOSIS — Z23 Encounter for immunization: Secondary | ICD-10-CM | POA: Diagnosis not present

## 2020-08-24 DIAGNOSIS — E782 Mixed hyperlipidemia: Secondary | ICD-10-CM | POA: Diagnosis not present

## 2020-08-24 DIAGNOSIS — N182 Chronic kidney disease, stage 2 (mild): Secondary | ICD-10-CM | POA: Diagnosis not present

## 2020-08-24 DIAGNOSIS — Z6831 Body mass index (BMI) 31.0-31.9, adult: Secondary | ICD-10-CM | POA: Diagnosis not present

## 2020-08-24 DIAGNOSIS — R609 Edema, unspecified: Secondary | ICD-10-CM | POA: Diagnosis not present

## 2020-08-24 DIAGNOSIS — K219 Gastro-esophageal reflux disease without esophagitis: Secondary | ICD-10-CM | POA: Diagnosis not present

## 2020-08-24 DIAGNOSIS — N189 Chronic kidney disease, unspecified: Secondary | ICD-10-CM | POA: Diagnosis not present

## 2020-08-24 DIAGNOSIS — D631 Anemia in chronic kidney disease: Secondary | ICD-10-CM | POA: Diagnosis not present

## 2020-08-24 DIAGNOSIS — E1149 Type 2 diabetes mellitus with other diabetic neurological complication: Secondary | ICD-10-CM | POA: Diagnosis not present

## 2020-08-24 DIAGNOSIS — I10 Essential (primary) hypertension: Secondary | ICD-10-CM | POA: Diagnosis not present

## 2020-09-06 DIAGNOSIS — Z23 Encounter for immunization: Secondary | ICD-10-CM | POA: Diagnosis not present

## 2020-09-09 ENCOUNTER — Ambulatory Visit (INDEPENDENT_AMBULATORY_CARE_PROVIDER_SITE_OTHER): Payer: Medicare Other | Admitting: Adult Health

## 2020-09-09 ENCOUNTER — Encounter: Payer: Self-pay | Admitting: Adult Health

## 2020-09-09 ENCOUNTER — Other Ambulatory Visit: Payer: Self-pay

## 2020-09-09 DIAGNOSIS — G4733 Obstructive sleep apnea (adult) (pediatric): Secondary | ICD-10-CM

## 2020-09-09 DIAGNOSIS — J302 Other seasonal allergic rhinitis: Secondary | ICD-10-CM

## 2020-09-09 DIAGNOSIS — J309 Allergic rhinitis, unspecified: Secondary | ICD-10-CM | POA: Insufficient documentation

## 2020-09-09 NOTE — Progress Notes (Signed)
@Patient  ID: Laura Garner, female    DOB: 10/30/1941, 79 y.o.   MRN: [id number]  Chief Complaint  Patient presents with  . Follow-up    OSA     Referring provider: Philmore Pali, NP  HPI: 79 year old female never smoker followed for obstructive sleep apnea Medical history significant for hypertension and obesity  TEST/EVENTS :  PSG 01/02/18: mod-severe OSA. RDI 37.4. Autoset 5-20 recommended Compliance 02/25-03/26/19: 30/30 nights.  Compliance 09/03-10/02/19: 30/30 nights. Mean usage 7.2 hrs. Median pressure 19.7 mmHg. AHI 19.4 Compliance 04/18-05/17/20: 30/30 nights. Mean usage: 7.0 hrs. Median pressure 9.3 cm H2O. AHI 10/hr  09/09/2020  Follow up : OSA  Patient presents for a 1 year follow-up.  Patient has underlying obstructive sleep 9.  Is on nocturnal CPAP.  Patient says she is doing well on CPAP.  She wears it every single night.  Feels rested with no significant daytime sleepiness.  CPAP download shows excellent compliance with daily average usage at 6.5 hours.  Patient is on auto CPAP 10 to 20 cm H2O.  AHI 6.1 daily average pressure 14.9.  Positive leaks. Rides recumbent bike daily .  Does complain that she has had some increased allergy symptoms with stuffy nose and drainage.  Mucus has been clear.  She denies any sinus pain pressure or fever.  Has recently got her COVID-19 second vaccine earlier this week.     Allergies  Allergen Reactions  . Iodinated Diagnostic Agents Anaphylaxis  . Amlodipine     Swelling/'foggy sensation'  . Avelox [Moxifloxacin Hcl]     Weakness and felt bad  . Doxazosin     Explosive diarrhea   . Hydralazine     anxiety  . Iodine   . Trimethoprim Other (See Comments) and Diarrhea    Extreme diarrhea     Immunization History  Administered Date(s) Administered  . Fluad Quad(high Dose 65+) 09/15/2019  . Influenza, High Dose Seasonal PF 09/04/2018  . PFIZER SARS-COV-2 Vaccination 08/06/2020, 08/30/2020  . Zoster Recombinat (Shingrix)  09/09/2018, 11/19/2018    Past Medical History:  Diagnosis Date  . Anemia   . Anxiety    no meds  . Arthritis    neck - otc med prn  . Chest pain    a. 10/2017 MV: EF 55-65%, no ischemia, infarct.  . Diabetes mellitus    type 2  . Diastolic dysfunction    a. 10/2017 Echo: EF 55-60%, no rwma, Gr1 DD, mod dil LA, PASP 80mmHg.  Marland Kitchen GERD (gastroesophageal reflux disease)   . Heart palpitations    hx - caused by medication  . History of kidney stones    multiple kidney stones -passed some stones and surgery x 3   . Hyperlipidemia   . Hypertension    a. Difficult to control-->10/2017 Renal Duplex: < 60% bilat RAS.  Marland Kitchen Left breast mass   . PAH (pulmonary artery hypertension) ([place])    a. 10/2017 Echo: PASP 74mmHg.  Marland Kitchen RBBB   . Reflux   . Seasonal allergies   . Sleep apnea    wears CPAP nightly  . SVD (spontaneous vaginal delivery)    x 3    Tobacco History: Social History   Tobacco Use  Smoking Status Never Smoker  Smokeless Tobacco Never Used   Counseling given: Not Answered   Outpatient Medications Prior to Visit  Medication Sig Dispense Refill  . aspirin EC 81 MG tablet Take 81 mg by mouth daily.    . carvedilol (COREG) 12.5 MG  tablet Take 12.5 mg by mouth 2 (two) times daily with a meal.    . cloNIDine (CATAPRES - DOSED IN MG/24 HR) 0.1 mg/24hr patch APPLY 1 PATCH TOPICALLY ONCE A WEEK  2  . diclofenac Sodium (VOLTAREN) 1 % GEL Apply 2 g topically 4 (four) times daily. Rub into affected area of foot 2 to 4 times daily 100 g 2  . ferrous sulfate 325 (65 FE) MG tablet Take 325 mg by mouth daily with breakfast.    . loratadine (CLARITIN) 10 MG tablet Take 10 mg by mouth daily.    Marland Kitchen losartan (COZAAR) 100 MG tablet Take 100 mg by mouth at bedtime.     . Multiple Vitamin (MULTIVITAMIN) capsule Take 1 capsule by mouth daily.     . pantoprazole (PROTONIX) 40 MG tablet Take 40 mg by mouth daily as needed.     Vladimir Faster Glycol-Propyl Glycol (SYSTANE ULTRA OP) Place 1 drop  into both eyes daily as needed (dry eyes).    . pravastatin (PRAVACHOL) 20 MG tablet Take 20 mg by mouth at bedtime. Per patient taken 40 mg    . vitamin B-12 (CYANOCOBALAMIN) 1000 MCG tablet Take 1,000 mcg by mouth daily.    . chlorthalidone (HYGROTON) 25 MG tablet Take 0.5 tablets (12.5 mg total) by mouth daily. 45 tablet 2   No facility-administered medications prior to visit.     Review of Systems:   Constitutional:   No  weight loss, night sweats,  Fevers, chills, fatigue, or  lassitude.  HEENT:   No headaches,  Difficulty swallowing,  Tooth/dental problems, or  Sore throat,                No sneezing, itching, ear ache,  +nasal congestion, post nasal drip,   CV:  No chest pain,  Orthopnea, PND, swelling in lower extremities, anasarca, dizziness, palpitations, syncope.   GI  No heartburn, indigestion, abdominal pain, nausea, vomiting, diarrhea, change in bowel habits, loss of appetite, bloody stools.   Resp: No shortness of breath with exertion or at rest.  No excess mucus, no productive cough,  No non-productive cough,  No coughing up of blood.  No change in color of mucus.  No wheezing.  No chest wall deformity  Skin: no rash or lesions.  GU: no dysuria, change in color of urine, no urgency or frequency.  No flank pain, no hematuria   MS:  No joint pain or swelling.  No decreased range of motion.  No back pain.    Physical Exam  BP 128/72 (BP Location: Left Arm, Cuff Size: Normal)   Pulse 69   Temp 97.7 F (36.5 C) (Temporal)   Ht 5' 1.5" (1.562 m)   Wt 171 lb 3.2 oz (77.7 kg)   SpO2 97%   BMI 31.82 kg/m   GEN: A/Ox3; pleasant , NAD, well nourished    HEENT:  [place]/AT,   NOSE-clear, THROAT-clear, no lesions, no postnasal drip or exudate noted.   NECK:  Supple w/ fair ROM; no JVD; normal carotid impulses w/o bruits; no thyromegaly or nodules palpated; no lymphadenopathy.    RESP  Clear  P & A; w/o, wheezes/ rales/ or rhonchi. no accessory muscle use, no dullness to  percussion  CARD:  RRR, no m/r/g, no peripheral edema, pulses intact, no cyanosis or clubbing.  GI:   Soft & nt; nml bowel sounds; no organomegaly or masses detected.   Musco: Warm bil, no deformities or joint swelling noted.   Neuro: alert,  no focal deficits noted.    Skin: Warm, no lesions or rashes    Lab Results:  CBC  BNP No results found for: BNP  ProBNP No results found for: PROBNP  Imaging: No results found.    No flowsheet data found.  No results found for: NITRICOXIDE      Assessment & Plan:   OSA (obstructive sleep apnea) Excellent control and compliance on CPAP  PLAN  Patient Instructions  Continue on CPAP at bedtime Keep up the good work Do not drive a sleepy Saline nasal rinses and gel.  Can try Zyrtec in place of claritin At bedtime  .  Flonase 1 puff daily for 1 week then as needed.  Mucinex DM Twice daily  As needed  Cough (can try the liquid)  Follow-up in 1 year with Dr. Mortimer Fries and As needed         Allergic rhinitis May change Claritin to Zyrtec.  And add in Flonase as needed Continue with saline nasal rinses.  Plan  Patient Instructions  Continue on CPAP at bedtime Keep up the good work Do not drive a sleepy Saline nasal rinses and gel.  Can try Zyrtec in place of claritin At bedtime  .  Flonase 1 puff daily for 1 week then as needed.  Mucinex DM Twice daily  As needed  Cough (can try the liquid)  Follow-up in 1 year with Dr. Mortimer Fries and As needed            Rexene Edison, NP 09/09/2020

## 2020-09-09 NOTE — Assessment & Plan Note (Signed)
May change Claritin to Zyrtec.  And add in Flonase as needed Continue with saline nasal rinses.  Plan  Patient Instructions  Continue on CPAP at bedtime Keep up the good work Do not drive a sleepy Saline nasal rinses and gel.  Can try Zyrtec in place of claritin At bedtime  .  Flonase 1 puff daily for 1 week then as needed.  Mucinex DM Twice daily  As needed  Cough (can try the liquid)  Follow-up in 1 year with Dr. Mortimer Fries and As needed

## 2020-09-09 NOTE — Assessment & Plan Note (Signed)
Excellent control and compliance on CPAP  PLAN  Patient Instructions  Continue on CPAP at bedtime Keep up the good work Do not drive a sleepy Saline nasal rinses and gel.  Can try Zyrtec in place of claritin At bedtime  .  Flonase 1 puff daily for 1 week then as needed.  Mucinex DM Twice daily  As needed  Cough (can try the liquid)  Follow-up in 1 year with Dr. Mortimer Fries and As needed

## 2020-09-09 NOTE — Patient Instructions (Addendum)
Continue on CPAP at bedtime Keep up the good work Do not drive a sleepy Saline nasal rinses and gel.  Can try Zyrtec in place of claritin At bedtime  .  Flonase 1 puff daily for 1 week then as needed.  Mucinex DM Twice daily  As needed  Cough (can try the liquid)  Follow-up in 1 year with Dr. Mortimer Fries and As needed

## 2020-09-13 DIAGNOSIS — Z0000 Encounter for general adult medical examination without abnormal findings: Secondary | ICD-10-CM | POA: Diagnosis not present

## 2020-09-13 DIAGNOSIS — Z1331 Encounter for screening for depression: Secondary | ICD-10-CM | POA: Diagnosis not present

## 2020-09-13 DIAGNOSIS — E669 Obesity, unspecified: Secondary | ICD-10-CM | POA: Diagnosis not present

## 2020-09-13 DIAGNOSIS — Z9181 History of falling: Secondary | ICD-10-CM | POA: Diagnosis not present

## 2020-09-13 DIAGNOSIS — E785 Hyperlipidemia, unspecified: Secondary | ICD-10-CM | POA: Diagnosis not present

## 2020-09-27 DIAGNOSIS — Z23 Encounter for immunization: Secondary | ICD-10-CM | POA: Diagnosis not present

## 2020-10-11 ENCOUNTER — Ambulatory Visit (INDEPENDENT_AMBULATORY_CARE_PROVIDER_SITE_OTHER): Payer: Medicare Other | Admitting: Podiatry

## 2020-10-11 ENCOUNTER — Other Ambulatory Visit: Payer: Self-pay

## 2020-10-11 ENCOUNTER — Telehealth: Payer: Self-pay | Admitting: Podiatry

## 2020-10-11 DIAGNOSIS — E119 Type 2 diabetes mellitus without complications: Secondary | ICD-10-CM | POA: Diagnosis not present

## 2020-10-11 DIAGNOSIS — M79674 Pain in right toe(s): Secondary | ICD-10-CM | POA: Diagnosis not present

## 2020-10-11 DIAGNOSIS — B351 Tinea unguium: Secondary | ICD-10-CM

## 2020-10-11 DIAGNOSIS — Q828 Other specified congenital malformations of skin: Secondary | ICD-10-CM

## 2020-10-11 DIAGNOSIS — M79675 Pain in left toe(s): Secondary | ICD-10-CM

## 2020-10-11 NOTE — Telephone Encounter (Signed)
Called pt to schedule an appt for a articulated brace per Liliane Channel and pt stated she will have to call back to schedule as her daughter brings her and has a few appts that she has to go to before we can schedule an appt. Pt to call.

## 2020-10-14 NOTE — Progress Notes (Signed)
Subjective: 79 y.o. returns the office today for painful, elongated, thickened toenails which she cannot trim herself as well as for calluses to both of her feet.  She says injections were helpful last appointment.  She has been wearing the Alpha still as well as the orthotic.  Pain seems to be controlled today to the feet and she has been doing well.  Denies recent injury or falls or changes otherwise.  PCP: Isaias Sakai, DO   Objective: AAO 3, NAD DP/PT pulses palpable, CRT less than 3 seconds Nails hypertrophic, dystrophic, elongated, brittle, discolored 10. There is tenderness overlying the nails 1-5 bilaterally. There is no surrounding erythema or drainage along the nail sites. Hyperkeratotic lesions bilateral medial arch on the area of the talar prominence.  No ongoing ulceration, drainage or any signs of infection. Significant flatfoot is present there is no area of pinpoint tenderness there is no pain to the sinus tarsi today.  There is no other areas of discomfort. No pain with calf compression, swelling, warmth, erythema.  Assessment: Patient presents with symptomatic onychomycosis, hyperkeratotic lesions  Plan: -Treatment options including alternatives, risks, complications were discussed -X-rays obtained reviewed.  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  No evidence of acute fracture. -Nails sharply debrided 10 without complication/bleeding. -Hyperkeratotic lesion sharply debrided x2 without any complications or bleeding. -I had Rick evaluate the [place] brace to see about possible modifications.  The brace is several years old and we may need to replace this at some point.  Trula Slade DPM

## 2020-10-29 ENCOUNTER — Encounter: Payer: Self-pay | Admitting: Nurse Practitioner

## 2020-10-29 ENCOUNTER — Ambulatory Visit (INDEPENDENT_AMBULATORY_CARE_PROVIDER_SITE_OTHER): Payer: Medicare Other | Admitting: Nurse Practitioner

## 2020-10-29 ENCOUNTER — Other Ambulatory Visit: Payer: Self-pay

## 2020-10-29 VITALS — BP 190/80 | HR 56 | Ht 61.5 in | Wt 180.0 lb

## 2020-10-29 DIAGNOSIS — I10 Essential (primary) hypertension: Secondary | ICD-10-CM

## 2020-10-29 DIAGNOSIS — N182 Chronic kidney disease, stage 2 (mild): Secondary | ICD-10-CM | POA: Diagnosis not present

## 2020-10-29 DIAGNOSIS — E782 Mixed hyperlipidemia: Secondary | ICD-10-CM

## 2020-10-29 NOTE — Progress Notes (Signed)
Office Visit    Patient Name: Laura Garner Date of Encounter: 10/29/2020  Primary Care Provider:  Philmore Pali, NP Primary Cardiologist:  Nelva Bush, MD  Chief Complaint    79 year old female with a history of difficult to control hypertension, hyperlipidemia, type 2 diabetes mellitus, stage II chronic kidney disease, obesity, and GERD, presents for follow-up related to hypertension.  Past Medical History    Past Medical History:  Diagnosis Date  . Anemia   . Anxiety    no meds  . Arthritis    neck - otc med prn  . Chest pain    a. 10/2017 MV: EF 55-65%, no ischemia, infarct.  . Diabetes mellitus    type 2  . Diastolic dysfunction    a. 10/2017 Echo: EF 55-60%, no rwma, Gr1 DD, mod dil LA, PASP 35mmHg.  Marland Kitchen GERD (gastroesophageal reflux disease)   . Heart palpitations    hx - caused by medication  . History of kidney stones    multiple kidney stones -passed some stones and surgery x 3   . Hyperlipidemia   . Hypertension    a. Difficult to control-->10/2017 Renal Duplex: < 60% bilat RAS.  Marland Kitchen Left breast mass   . PAH (pulmonary artery hypertension) ([place])    a. 10/2017 Echo: PASP 44mmHg.  Marland Kitchen RBBB   . Reflux   . Seasonal allergies   . Sleep apnea    wears CPAP nightly  . SVD (spontaneous vaginal delivery)    x 3   Past Surgical History:  Procedure Laterality Date  . BREAST LUMPECTOMY WITH RADIOACTIVE SEED LOCALIZATION Left 07/19/2018   Procedure: LEFT BREAST LUMPECTOMY WITH RADIOACTIVE SEED LOCALIZATION ERAS PATHWAY;  Surgeon: Jovita Kussmaul, MD;  Location: [facility];  Service: General;  Laterality: Left;  . HYSTEROSCOPY WITH D & C N/A 03/22/2017   Procedure: DILATATION AND CURETTAGE /HYSTEROSCOPY;  Surgeon: Newton Pigg, MD;  Location: [facility];  Service: Gynecology;  Laterality: N/A;  . lithrotripsy     surgery x 3  . right foot surgery     repair tendon  . SHOULDER SURGERY     x 2 - right and left    Allergies  Allergies  Allergen  Reactions  . Iodinated Diagnostic Agents Anaphylaxis  . Amlodipine     Swelling/'foggy sensation'  . Avelox [Moxifloxacin Hcl]     Weakness and felt bad  . Doxazosin     Explosive diarrhea   . Hydralazine     anxiety  . Iodine   . Trimethoprim Other (See Comments) and Diarrhea    Extreme diarrhea     History of Present Illness    79 year old female with above complex past medical history including difficult to control hypertension, hyperlipidemia, diabetes, stage II chronic kidney disease, obesity, and GERD.  She has had multiple medication intolerances in setting of trying to get her blood pressure down including amlodipine, hydralazine, HCTZ, doxazosin, and lisinopril.  In November 2018, she underwent work-up for secondary hypertension and was found to have a normal serum aldosterone/plasma renin activity, no significant renal artery stenosis on renal duplex, and normal LV function on echocardiogram.  Stress testing was also undertaken and was nonischemic.  She was placed on chlorthalidone in December 2018 with subsequent significant improvement in blood pressure.  At her last office visit in November 2020, she reported 31 pound weight loss over the prior year with improvement in hemoglobin A1c and blood pressures on her home cuff.  Her  pressure was elevated at that office visit but she followed pressures for a week later and reported back with normal blood pressures.  Over the past year, she reports that she has done well.  She continues to ride her exercise bike 5 miles most days of the week.  She does not experience chest pain or dyspnea.  She does have some mild lower extremity swelling just above her sock line, which has been stable.  She checks her blood pressure every day and typically runs in the 130s.  She notes significant anxiety related to coming to cardiology office visits and her blood pressure was 993 systolic at home this morning but as she got closer to this appointment time,  she started feeling anxious and repeat her blood pressure and it was 170.  She was 210/84 on arrival and 190/80 on repeat.  A recent pulmonology visit in September, her blood pressure was 128/72.  She reports compliance with medications and salt avoidance.  She denies palpitations, PND, orthopnea, dizziness, syncope, or early satiety.  She has been bothered by foot pain in the setting of dropping arches and has been followed closely by podiatry.  Home Medications    Prior to Admission medications   Medication Sig Start Date End Date Taking? Authorizing Provider  aspirin EC 81 MG tablet Take 81 mg by mouth daily.    [provider]  carvedilol (COREG) 12.5 MG tablet Take 12.5 mg by mouth 2 (two) times daily with a meal.    [provider]  chlorthalidone (HYGROTON) 25 MG tablet Take 0.5 tablets (12.5 mg total) by mouth daily. 03/08/20 06/06/20  Theora Gianotti, NP  cloNIDine (CATAPRES - DOSED IN MG/24 HR) 0.1 mg/24hr patch APPLY 1 PATCH TOPICALLY ONCE A WEEK 10/14/18   [provider]  diclofenac Sodium (VOLTAREN) 1 % GEL Apply 2 g topically 4 (four) times daily. Rub into affected area of foot 2 to 4 times daily 11/10/19   Trula Slade, DPM  ferrous sulfate 325 (65 FE) MG tablet Take 325 mg by mouth daily with breakfast.    [provider]  loratadine (CLARITIN) 10 MG tablet Take 10 mg by mouth daily.    [provider]  losartan (COZAAR) 100 MG tablet Take 100 mg by mouth at bedtime.     [provider]  Multiple Vitamin (MULTIVITAMIN) capsule Take 1 capsule by mouth daily.     [provider]  pantoprazole (PROTONIX) 40 MG tablet Take 40 mg by mouth daily as needed.  05/31/15   [provider]  Polyethyl Glycol-Propyl Glycol (SYSTANE ULTRA OP) Place 1 drop into both eyes daily as needed (dry eyes).    [provider]  pravastatin (PRAVACHOL) 20 MG tablet Take 20 mg by mouth at bedtime. Per patient taken 40  mg    [provider]  pravastatin (PRAVACHOL) 40 MG tablet  07/23/20   [provider]  vitamin B-12 (CYANOCOBALAMIN) 1000 MCG tablet Take 1,000 mcg by mouth daily.    [provider]    Review of Systems    Mild, chronic, stable ankle swelling.  She denies chest pain, palpitations, PND, orthopnea, dizziness, syncope, edema, or early satiety..  All other systems reviewed and are otherwise negative except as noted above.  Physical Exam    VS:  BP (!) 190/80 (BP Location: Left Arm, Patient Position: Sitting, Cuff Size: Normal)   Pulse (!) 56   Ht 5' 1.5" (1.562 m)   Wt 180 lb (  81.6 kg)   SpO2 98%   BMI 33.46 kg/m  , BMI Body mass index is 33.46 kg/m. GEN: Well nourished, well developed, in no acute distress. HEENT: normal. Neck: Supple, no JVD, carotid bruits, or masses. Cardiac: RRR, no murmurs, rubs, or gallops. No clubbing, cyanosis, trace to 1+ ankle edema just above her sock line..  Radials 2+/PT 1+ and equal bilaterally.  Respiratory:  Respirations regular and unlabored, clear to auscultation bilaterally. GI: Soft, nontender, nondistended, BS + x 4. MS: no deformity or atrophy. Skin: warm and dry, no rash. Neuro:  Strength and sensation are intact. Psych: Normal affect.  Accessory Clinical Findings    ECG personally reviewed by me today -sinus bradycardia, 56, left axis deviation, right bundle branch block- no acute changes.  Lab work primary care dated July 21, 2020: Total cholesterol 190, triglycerides 102, HDL 58, LDL 114 Sodium 139, potassium 4.6, chloride 103, CO2 22, BUN 46, creatinine 1.3, glucose 153 Calcium 9.4, total protein 6.6, albumin 4.5, magnesium 1.5 Total bilirubin 0.7, alkaline phosphatase 78, AST 14, ALT 12 Hemoglobin 11.1, hematocrit 34, WBC 7.8, platelets 207 Hemoglobin A1c 6.7  Assessment & Plan    1.  Essential hypertension/whitecoat hypertension: She checks her blood pressure regularly at home and pressures typically  trend in the 130s.  She has been tolerating antihypertensive therapy well.  She does note that cardiology office visits tend to increase her blood pressure and she started feeling anxious a few hours before this visit.  Pressure first thing this morning was 136 however as she started feeling some anxiety related to today's appointment, she checked again and it rose to 170.  On arrival here, she was 210/84 with follow-up of 190/80.  She is in no distress.  She did take all of her medicine this morning.  She is not due to change her clonidine patch until Sunday.  She has previously correlated her home cuff with an in office cuff.  Similar scenario occurred last year and when she sent this pressures a week later, she was trending in a more normal range.  I recommended that she repeat her blood pressure later this afternoon and that if systolic remains greater than 170, she can take another half tablet of chlorthalidone.  Otherwise, she should continue to take blood pressure daily and contact us if she begins trending greater than 622 systolic.  She will alert [place] if this occurs, at which time, we could consider increasing chlorthalidone to 25 mg daily.  She otherwise remains on carvedilol and losartan.  Carvedilol dosing limited by bradycardia.  2.  Hyperlipidemia: On pravastatin.  LDL was 114 in August with normal LFTs.  3.  Stage II chronic kidney disease: Creatinine stable at 1.3 on August check.  4.  Type 2 diabetes mellitus: This is now diet controlled.  A1c 6.7 in August.  Continue ARB and statin.    5.  Disposition: Patient to follow blood pressures and contact us in the next week or so with trends.  Provided this is stable, she can follow-up in a year.   Murray Hodgkins, NP 10/29/2020, 2:03 PM

## 2020-10-29 NOTE — Patient Instructions (Addendum)
Medication Instructions:  - Your physician recommends that you continue on your current medications as directed. Please refer to the Current Medication list given to you today.  *If you need a refill on your cardiac medications before your next appointment, please call your pharmacy*   Lab Work: - none ordered  If you have labs (blood work) drawn today and your tests are completely normal, you will receive your results only by: Marland Kitchen MyChart Message (if you have MyChart) OR . A paper copy in the mail If you have any lab test that is abnormal or we need to change your treatment, we will call you to review the results.   Testing/Procedures: - none ordered   Follow-Up: At [facility], you and your health needs are our priority.  As part of our continuing mission to provide you with exceptional heart care, we have created designated Provider Care Teams.  These Care Teams include your primary Cardiologist (physician) and Advanced Practice Providers (APPs -  Physician Assistants and Nurse Practitioners) who all work together to provide you with the care you need, when you need it.  We recommend signing up for the patient portal called "MyChart".  Sign up information is provided on this After Visit Summary.  MyChart is used to connect with patients for Virtual Visits (Telemedicine).  Patients are able to view lab/test results, encounter notes, upcoming appointments, etc.  Non-urgent messages can be sent to your provider as well.   To learn more about what you can do with MyChart, go to [email or web address].    Your next appointment:   1 year(s)  The format for your next appointment:   In Person  Provider:   Nelva Bush, MD   Other Instructions  1) Call the office if your blood pressure remains elevated at home- [phone]

## 2020-11-05 DIAGNOSIS — E119 Type 2 diabetes mellitus without complications: Secondary | ICD-10-CM | POA: Diagnosis not present

## 2020-11-23 DIAGNOSIS — M5388 Other specified dorsopathies, sacral and sacrococcygeal region: Secondary | ICD-10-CM | POA: Diagnosis not present

## 2020-11-23 DIAGNOSIS — M4307 Spondylolysis, lumbosacral region: Secondary | ICD-10-CM | POA: Diagnosis not present

## 2020-11-23 DIAGNOSIS — M9904 Segmental and somatic dysfunction of sacral region: Secondary | ICD-10-CM | POA: Diagnosis not present

## 2020-11-23 DIAGNOSIS — M4304 Spondylolysis, thoracic region: Secondary | ICD-10-CM | POA: Diagnosis not present

## 2020-11-23 DIAGNOSIS — M9903 Segmental and somatic dysfunction of lumbar region: Secondary | ICD-10-CM | POA: Diagnosis not present

## 2020-11-23 DIAGNOSIS — M9902 Segmental and somatic dysfunction of thoracic region: Secondary | ICD-10-CM | POA: Diagnosis not present

## 2020-12-13 ENCOUNTER — Ambulatory Visit (INDEPENDENT_AMBULATORY_CARE_PROVIDER_SITE_OTHER): Payer: Medicare Other | Admitting: Podiatry

## 2020-12-13 ENCOUNTER — Other Ambulatory Visit: Payer: Self-pay

## 2020-12-13 DIAGNOSIS — Q828 Other specified congenital malformations of skin: Secondary | ICD-10-CM

## 2020-12-13 DIAGNOSIS — B351 Tinea unguium: Secondary | ICD-10-CM

## 2020-12-13 DIAGNOSIS — M79675 Pain in left toe(s): Secondary | ICD-10-CM | POA: Diagnosis not present

## 2020-12-13 DIAGNOSIS — M79674 Pain in right toe(s): Secondary | ICD-10-CM | POA: Diagnosis not present

## 2020-12-13 DIAGNOSIS — E119 Type 2 diabetes mellitus without complications: Secondary | ICD-10-CM

## 2020-12-13 NOTE — Progress Notes (Signed)
Subjective: 79 y.o. returns the office today for painful, elongated, thickened toenails which she cannot trim herself as well as for calluses to both of her feet.  Still getting intermittent chronic foot pain but does not need an injection today.  No recent injury or falls.  Denies recent injury or falls or changes otherwise.  PCP: Michaele Offer, DO   Objective: AAO 3, NAD DP/PT pulses palpable, CRT less than 3 seconds Nails hypertrophic, dystrophic, elongated, brittle, discolored 10. There is tenderness overlying the nails 1-5 bilaterally. There is no surrounding erythema or drainage along the nail sites. Hyperkeratotic lesions bilateral medial arch on the area of the talar prominence.  No ongoing ulceration, drainage or any signs of infection. Significant flatfoot is present there is no area of pinpoint tenderness there is no pain to the sinus tarsi today.  There is no other areas of discomfort. No pain with calf compression, swelling, warmth, erythema.  Assessment: Patient presents with symptomatic onychomycosis, hyperkeratotic lesions  Plan: -Treatment options including alternatives, risks, complications were discussed -X-rays obtained reviewed.  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  No evidence of acute fracture. -Nails sharply debrided 10 without complication/bleeding. -Hyperkeratotic lesion sharply debrided x2 without any complications or bleeding. -No injection performed today.  Return in about 9 weeks (around 02/14/2021).  Vivi Barrack DPM

## 2020-12-20 ENCOUNTER — Other Ambulatory Visit: Payer: Self-pay | Admitting: *Deleted

## 2020-12-20 MED ORDER — CHLORTHALIDONE 25 MG PO TABS: 12.5000 mg | ORAL_TABLET | Freq: Every day | ORAL | 2 refills | Status: DC

## 2021-02-14 ENCOUNTER — Other Ambulatory Visit: Payer: Self-pay

## 2021-02-14 ENCOUNTER — Ambulatory Visit (INDEPENDENT_AMBULATORY_CARE_PROVIDER_SITE_OTHER): Payer: Medicare Other | Admitting: Podiatry

## 2021-02-14 DIAGNOSIS — B351 Tinea unguium: Secondary | ICD-10-CM | POA: Diagnosis not present

## 2021-02-14 DIAGNOSIS — M779 Enthesopathy, unspecified: Secondary | ICD-10-CM

## 2021-02-14 DIAGNOSIS — M79675 Pain in left toe(s): Secondary | ICD-10-CM

## 2021-02-14 DIAGNOSIS — M79674 Pain in right toe(s): Secondary | ICD-10-CM

## 2021-02-14 DIAGNOSIS — E119 Type 2 diabetes mellitus without complications: Secondary | ICD-10-CM

## 2021-02-14 DIAGNOSIS — Q828 Other specified congenital malformations of skin: Secondary | ICD-10-CM

## 2021-02-14 MED ORDER — TRIAMCINOLONE ACETONIDE 10 MG/ML IJ SUSP
10.0000 mg | Freq: Once | INTRAMUSCULAR | Status: DC
Start: 2021-02-14 — End: 2021-11-14

## 2021-02-16 NOTE — Progress Notes (Signed)
Subjective: 80 y.o. returns the office today for painful, elongated, thickened toenails which she cannot trim herself as well as for calluses to both of her feet. She is try to get more discomfort in the outside aspect of the left ankle, foot. This is an area the previous discomfort where she gets injections. No recent injury or falls or changes otherwise. Denies recent injury or falls or changes otherwise.  PCP: Isaias Sakai, DO   Objective: AAO 3, NAD DP/PT pulses palpable, CRT less than 3 seconds Nails hypertrophic, dystrophic, elongated, brittle, discolored 10. There is tenderness overlying the nails 1-5 bilaterally. There is no surrounding erythema or drainage along the nail sites. Hyperkeratotic lesions bilateral medial arch on the area of the talar prominence.  No ongoing ulceration, drainage or any signs of infection. Significant flatfoot is present. There is palpation of the sinus tarsi in the left side. No area pinpoint tenderness. MMT 5/5. No pain with calf compression, swelling, warmth, erythema.  Assessment: Patient presents with symptomatic onychomycosis, hyperkeratotic lesions; capsulitis  Plan: -Treatment options including alternatives, risks, complications were discussed -Nails sharply debrided 10 without complication/bleeding. -Hyperkeratotic lesion sharply debrided x2 without any complications or bleeding. -Steroid shot performed. See procedure note below. Continue bracing.  Procedure: Injection Intermediate Joint Discussed alternatives, risks, complications and verbal consent was obtained.  Location: Left sinus tarsi  Skin Prep: Betadine. Injectate: 0.5cc 0.5% marcaine plain, 0.5 cc 2% lidocaine plain and, 1 cc kenalog 10. Disposition: Patient tolerated procedure well. Injection site dressed with a band-aid.  Post-injection care was discussed and return precautions discussed.   Return in about 9 weeks (around 02/14/2021).  Trula Slade DPM

## 2021-04-11 ENCOUNTER — Encounter: Payer: Self-pay | Admitting: Podiatry

## 2021-04-11 ENCOUNTER — Other Ambulatory Visit: Payer: Self-pay

## 2021-04-11 ENCOUNTER — Ambulatory Visit (INDEPENDENT_AMBULATORY_CARE_PROVIDER_SITE_OTHER): Payer: Medicare Other | Admitting: Podiatry

## 2021-04-11 DIAGNOSIS — B351 Tinea unguium: Secondary | ICD-10-CM | POA: Diagnosis not present

## 2021-04-11 DIAGNOSIS — Q828 Other specified congenital malformations of skin: Secondary | ICD-10-CM

## 2021-04-11 DIAGNOSIS — M79675 Pain in left toe(s): Secondary | ICD-10-CM

## 2021-04-11 DIAGNOSIS — E119 Type 2 diabetes mellitus without complications: Secondary | ICD-10-CM | POA: Diagnosis not present

## 2021-04-11 DIAGNOSIS — M79674 Pain in right toe(s): Secondary | ICD-10-CM

## 2021-04-13 NOTE — Progress Notes (Signed)
Subjective: 80 y.o. returns the office today for painful, elongated, thickened toenails which she cannot trim herself as well as for calluses to both of her feet.  Injections have been helpful and she denies recent injury or falls and she does not need any further injections currently.  She continues with the [place].  PCP: Isaias Sakai, DO   Objective: AAO 3, NAD DP/PT pulses palpable, CRT less than 3 seconds Nails hypertrophic, dystrophic, elongated, brittle, discolored 10. There is tenderness overlying the nails 1-5 bilaterally. There is no surrounding erythema or drainage along the nail sites. Hyperkeratotic lesions bilateral medial arch on the area of the talar prominence.  No ongoing ulceration, drainage or any signs of infection. Significant flatfoot is present there is no area of pinpoint tenderness there is no pain to the sinus tarsi today.  There is no other areas of discomfort. No pain with calf compression, swelling, warmth, erythema.  Assessment: Patient presents with symptomatic onychomycosis, hyperkeratotic lesions  Plan: -Treatment options including alternatives, risks, complications were discussed -X-rays obtained reviewed.  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  No evidence of acute fracture. -Nails sharply debrided 10 without complication/bleeding. -Hyperkeratotic lesion sharply debrided x2 without any complications or bleeding.  Trula Slade DPM

## 2021-05-02 DIAGNOSIS — E782 Mixed hyperlipidemia: Secondary | ICD-10-CM | POA: Diagnosis not present

## 2021-05-02 DIAGNOSIS — E1149 Type 2 diabetes mellitus with other diabetic neurological complication: Secondary | ICD-10-CM | POA: Diagnosis not present

## 2021-05-02 DIAGNOSIS — N189 Chronic kidney disease, unspecified: Secondary | ICD-10-CM | POA: Diagnosis not present

## 2021-05-13 DIAGNOSIS — N189 Chronic kidney disease, unspecified: Secondary | ICD-10-CM | POA: Diagnosis not present

## 2021-05-13 DIAGNOSIS — N182 Chronic kidney disease, stage 2 (mild): Secondary | ICD-10-CM | POA: Diagnosis not present

## 2021-05-13 DIAGNOSIS — E1149 Type 2 diabetes mellitus with other diabetic neurological complication: Secondary | ICD-10-CM | POA: Diagnosis not present

## 2021-05-13 DIAGNOSIS — I10 Essential (primary) hypertension: Secondary | ICD-10-CM | POA: Diagnosis not present

## 2021-05-13 DIAGNOSIS — Z6836 Body mass index (BMI) 36.0-36.9, adult: Secondary | ICD-10-CM | POA: Diagnosis not present

## 2021-05-13 DIAGNOSIS — R609 Edema, unspecified: Secondary | ICD-10-CM | POA: Diagnosis not present

## 2021-05-13 DIAGNOSIS — D631 Anemia in chronic kidney disease: Secondary | ICD-10-CM | POA: Diagnosis not present

## 2021-05-13 DIAGNOSIS — K219 Gastro-esophageal reflux disease without esophagitis: Secondary | ICD-10-CM | POA: Diagnosis not present

## 2021-05-13 DIAGNOSIS — E782 Mixed hyperlipidemia: Secondary | ICD-10-CM | POA: Diagnosis not present

## 2021-06-09 ENCOUNTER — Other Ambulatory Visit: Payer: Self-pay

## 2021-06-09 ENCOUNTER — Ambulatory Visit (INDEPENDENT_AMBULATORY_CARE_PROVIDER_SITE_OTHER): Payer: Medicare Other | Admitting: Podiatry

## 2021-06-09 DIAGNOSIS — M79674 Pain in right toe(s): Secondary | ICD-10-CM | POA: Diagnosis not present

## 2021-06-09 DIAGNOSIS — Q828 Other specified congenital malformations of skin: Secondary | ICD-10-CM | POA: Diagnosis not present

## 2021-06-09 DIAGNOSIS — E119 Type 2 diabetes mellitus without complications: Secondary | ICD-10-CM

## 2021-06-09 DIAGNOSIS — M79675 Pain in left toe(s): Secondary | ICD-10-CM | POA: Diagnosis not present

## 2021-06-09 DIAGNOSIS — B351 Tinea unguium: Secondary | ICD-10-CM | POA: Diagnosis not present

## 2021-06-12 NOTE — Progress Notes (Signed)
Subjective: 80 y.o. returns the office today for painful, elongated, thickened toenails which she cannot trim herself as well as for calluses to both of her feet. She has been doing well from a foot pain standpoint otherwise. No new concerns.   PCP: Isaias Sakai, DO  Objective: AAO 3, NAD DP/PT pulses palpable, CRT less than 3 seconds Nails hypertrophic, dystrophic, elongated, brittle, discolored 10. There is tenderness overlying the nails 1-5 bilaterally. There is no surrounding erythema or drainage along the nail sites. Hyperkeratotic lesions bilateral medial arch on the area of the talar prominence.  No ongoing ulceration, drainage or any signs of infection. Significant flatfoot is present No pain with calf compression, swelling, warmth, erythema.  Assessment: Patient presents with symptomatic onychomycosis, hyperkeratotic lesions  Plan: -Treatment options including alternatives, risks, complications were discussed -X-rays obtained reviewed.  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  No evidence of acute fracture. -Nails sharply debrided 10 without complication/bleeding. -Hyperkeratotic lesion sharply debrided x2 without any complications or bleeding.  Trula Slade DPM

## 2021-06-13 ENCOUNTER — Ambulatory Visit: Payer: Medicare Other | Admitting: Podiatry

## 2021-06-21 DIAGNOSIS — Z20822 Contact with and (suspected) exposure to covid-19: Secondary | ICD-10-CM | POA: Diagnosis not present

## 2021-08-15 DIAGNOSIS — E782 Mixed hyperlipidemia: Secondary | ICD-10-CM | POA: Diagnosis not present

## 2021-08-15 DIAGNOSIS — E1149 Type 2 diabetes mellitus with other diabetic neurological complication: Secondary | ICD-10-CM | POA: Diagnosis not present

## 2021-08-15 DIAGNOSIS — N189 Chronic kidney disease, unspecified: Secondary | ICD-10-CM | POA: Diagnosis not present

## 2021-08-19 DIAGNOSIS — K219 Gastro-esophageal reflux disease without esophagitis: Secondary | ICD-10-CM | POA: Diagnosis not present

## 2021-08-19 DIAGNOSIS — N182 Chronic kidney disease, stage 2 (mild): Secondary | ICD-10-CM | POA: Diagnosis not present

## 2021-08-19 DIAGNOSIS — E1149 Type 2 diabetes mellitus with other diabetic neurological complication: Secondary | ICD-10-CM | POA: Diagnosis not present

## 2021-08-19 DIAGNOSIS — I10 Essential (primary) hypertension: Secondary | ICD-10-CM | POA: Diagnosis not present

## 2021-08-19 DIAGNOSIS — E782 Mixed hyperlipidemia: Secondary | ICD-10-CM | POA: Diagnosis not present

## 2021-08-19 DIAGNOSIS — R609 Edema, unspecified: Secondary | ICD-10-CM | POA: Diagnosis not present

## 2021-08-19 DIAGNOSIS — N189 Chronic kidney disease, unspecified: Secondary | ICD-10-CM | POA: Diagnosis not present

## 2021-08-19 DIAGNOSIS — F320 Major depressive disorder, single episode, mild: Secondary | ICD-10-CM | POA: Diagnosis not present

## 2021-08-19 DIAGNOSIS — D631 Anemia in chronic kidney disease: Secondary | ICD-10-CM | POA: Diagnosis not present

## 2021-08-29 ENCOUNTER — Other Ambulatory Visit: Payer: Self-pay

## 2021-08-29 ENCOUNTER — Encounter: Payer: Self-pay | Admitting: Podiatry

## 2021-08-29 ENCOUNTER — Ambulatory Visit (INDEPENDENT_AMBULATORY_CARE_PROVIDER_SITE_OTHER): Payer: Medicare Other | Admitting: Podiatry

## 2021-08-29 DIAGNOSIS — B351 Tinea unguium: Secondary | ICD-10-CM

## 2021-08-29 DIAGNOSIS — E119 Type 2 diabetes mellitus without complications: Secondary | ICD-10-CM | POA: Diagnosis not present

## 2021-08-29 DIAGNOSIS — M79674 Pain in right toe(s): Secondary | ICD-10-CM | POA: Diagnosis not present

## 2021-08-29 DIAGNOSIS — Q828 Other specified congenital malformations of skin: Secondary | ICD-10-CM

## 2021-08-29 DIAGNOSIS — M79675 Pain in left toe(s): Secondary | ICD-10-CM

## 2021-08-30 ENCOUNTER — Other Ambulatory Visit: Payer: Self-pay | Admitting: Nurse Practitioner

## 2021-08-31 NOTE — Progress Notes (Signed)
Subjective: 80 y.o. returns the office today for painful, elongated, thickened toenails which she cannot trim herself as well as for calluses to both of her feet.  She was asking if she can wear that shoes with orthotics as well as the braces as it feels that the brace puts pressure on the side of her foot on the area the callus causing more discomfort.  No new concerns.   PCP: Isaias Sakai, DO  Objective: AAO 3, NAD DP/PT pulses palpable, CRT less than 3 seconds Nails hypertrophic, dystrophic, elongated, brittle, discolored 10. There is tenderness overlying the nails 1-5 bilaterally. There is no surrounding erythema or drainage along the nail sites. Hyperkeratotic lesions bilateral medial arch on the area of the talar prominence.  No ongoing ulceration, drainage or any signs of infection. Significant flatfoot is present No pain with calf compression, swelling, warmth, erythema.  Assessment: Patient presents with symptomatic onychomycosis, hyperkeratotic lesions  Plan: -Treatment options including alternatives, risks, complications were discussed -Nails sharply debrided 10 without complication/bleeding. -Hyperkeratotic lesion sharply debrided x2 without any complications or bleeding. -Discussed wearing the shoe with the offloading orthotic when not doing much activity particularly outside but otherwise wear the brace.  Discussed that we can try to modify the brace in order to further offload the areas.  Hopefully we would be able to "bubble" the area of concern as opposed to just padding.  Trula Slade DPM

## 2021-09-01 DIAGNOSIS — R197 Diarrhea, unspecified: Secondary | ICD-10-CM | POA: Diagnosis not present

## 2021-09-01 DIAGNOSIS — D649 Anemia, unspecified: Secondary | ICD-10-CM | POA: Diagnosis not present

## 2021-09-01 DIAGNOSIS — R109 Unspecified abdominal pain: Secondary | ICD-10-CM | POA: Diagnosis not present

## 2021-09-01 DIAGNOSIS — R12 Heartburn: Secondary | ICD-10-CM | POA: Diagnosis not present

## 2021-09-01 DIAGNOSIS — Z1212 Encounter for screening for malignant neoplasm of rectum: Secondary | ICD-10-CM | POA: Diagnosis not present

## 2021-09-13 DIAGNOSIS — Z23 Encounter for immunization: Secondary | ICD-10-CM | POA: Diagnosis not present

## 2021-09-15 DIAGNOSIS — Z0000 Encounter for general adult medical examination without abnormal findings: Secondary | ICD-10-CM | POA: Diagnosis not present

## 2021-09-15 DIAGNOSIS — Z9181 History of falling: Secondary | ICD-10-CM | POA: Diagnosis not present

## 2021-09-15 DIAGNOSIS — E669 Obesity, unspecified: Secondary | ICD-10-CM | POA: Diagnosis not present

## 2021-09-15 DIAGNOSIS — E785 Hyperlipidemia, unspecified: Secondary | ICD-10-CM | POA: Diagnosis not present

## 2021-09-15 DIAGNOSIS — Z1331 Encounter for screening for depression: Secondary | ICD-10-CM | POA: Diagnosis not present

## 2021-09-15 DIAGNOSIS — Z6837 Body mass index (BMI) 37.0-37.9, adult: Secondary | ICD-10-CM | POA: Diagnosis not present

## 2021-09-19 DIAGNOSIS — D519 Vitamin B12 deficiency anemia, unspecified: Secondary | ICD-10-CM | POA: Diagnosis not present

## 2021-09-19 DIAGNOSIS — D649 Anemia, unspecified: Secondary | ICD-10-CM | POA: Diagnosis not present

## 2021-09-20 DIAGNOSIS — D649 Anemia, unspecified: Secondary | ICD-10-CM | POA: Diagnosis not present

## 2021-10-07 DIAGNOSIS — E1149 Type 2 diabetes mellitus with other diabetic neurological complication: Secondary | ICD-10-CM | POA: Diagnosis not present

## 2021-10-07 DIAGNOSIS — Z139 Encounter for screening, unspecified: Secondary | ICD-10-CM | POA: Diagnosis not present

## 2021-10-07 DIAGNOSIS — I10 Essential (primary) hypertension: Secondary | ICD-10-CM | POA: Diagnosis not present

## 2021-10-07 DIAGNOSIS — Z6837 Body mass index (BMI) 37.0-37.9, adult: Secondary | ICD-10-CM | POA: Diagnosis not present

## 2021-10-07 DIAGNOSIS — F320 Major depressive disorder, single episode, mild: Secondary | ICD-10-CM | POA: Diagnosis not present

## 2021-10-07 DIAGNOSIS — K529 Noninfective gastroenteritis and colitis, unspecified: Secondary | ICD-10-CM | POA: Diagnosis not present

## 2021-10-31 ENCOUNTER — Other Ambulatory Visit: Payer: Self-pay

## 2021-10-31 ENCOUNTER — Encounter: Payer: Self-pay | Admitting: Adult Health

## 2021-10-31 ENCOUNTER — Ambulatory Visit (INDEPENDENT_AMBULATORY_CARE_PROVIDER_SITE_OTHER): Payer: Medicare Other | Admitting: Adult Health

## 2021-10-31 ENCOUNTER — Ambulatory Visit (INDEPENDENT_AMBULATORY_CARE_PROVIDER_SITE_OTHER): Payer: Medicare Other | Admitting: Podiatry

## 2021-10-31 DIAGNOSIS — M79674 Pain in right toe(s): Secondary | ICD-10-CM | POA: Diagnosis not present

## 2021-10-31 DIAGNOSIS — E119 Type 2 diabetes mellitus without complications: Secondary | ICD-10-CM | POA: Diagnosis not present

## 2021-10-31 DIAGNOSIS — B351 Tinea unguium: Secondary | ICD-10-CM

## 2021-10-31 DIAGNOSIS — G4733 Obstructive sleep apnea (adult) (pediatric): Secondary | ICD-10-CM

## 2021-10-31 DIAGNOSIS — M79675 Pain in left toe(s): Secondary | ICD-10-CM

## 2021-10-31 DIAGNOSIS — J302 Other seasonal allergic rhinitis: Secondary | ICD-10-CM | POA: Diagnosis not present

## 2021-10-31 DIAGNOSIS — Q828 Other specified congenital malformations of skin: Secondary | ICD-10-CM

## 2021-10-31 NOTE — Assessment & Plan Note (Signed)
Excellent compliance, has a few events .  Discussed changing mask, she wants to think about it .  Would consider the dream wear full face   Plan  Patient Instructions  Continue on CPAP at bedtime Call back if you want to change to Dream wear full face mask.  Keep up the good work Do not drive a sleepy Saline nasal rinses and gel.  Zyrtec 10mg  daily As needed   Flonase 1 puff daily for 1 week then as needed.  Mucinex DM Twice daily  As needed  Cough (can try the liquid)  Follow-up in 1 year with Dr. Mortimer Fries and As needed

## 2021-10-31 NOTE — Progress Notes (Signed)
@Patient  ID: Laura Garner, female    DOB: 1941/03/04, 80 y.o.   MRN: [id number]  Chief Complaint  Patient presents with   Follow-up    Referring provider: Philmore Pali, NP  HPI: 80 year old female never smoker followed for obstructive sleep apnea Medical history significant for hypertension and obesity  TEST/EVENTS :  PSG 01/02/18: mod-severe OSA. RDI 37.4. Aut[employer]et 5-20 recommended Compliance 02/25-03/26/19: 30/30 nights.  Compliance 09/03-10/02/19: 30/30 nights. Mean usage 7.2 hrs. Median pressure 19.7 mmHg. AHI 19.4 Compliance 04/18-05/17/20: 30/30 nights. Mean usage: 7.0 hrs. Median pressure 9.3 cm H2O. AHI 10/hr    10/31/2021 Follow up : OSA  Patient returns for a 1 year follow-up.  Patient has underlying obstructive sleep apnea.  Patient is on nocturnal CPAP.  Patient says she is doing very well CPAP.  She says she can never miss a night.  She has to sleep with her CPAP each night.  CPAP compliance shows excellent compliance at 100% usage.  Daily average usage at 7.5 hours.  Patient is on auto CPAP 10 to 20 cm H2O.  AHI is 10.3/hour.  Patient does have quite a bit of mask leaks.  We talked about changing her supplies. Uses a full face mask says it does leak but not sure she wants to change. We discussed the dream wear full face as it does not go over the nose. She wants to think about it.  Patient says she remains active.  Tries to ride her recumbent bike daily. Uses nasal gel , feels it really helps a lot with stuffiness.    Allergies  Allergen Reactions   Iodinated Diagnostic Agents Anaphylaxis   Amlodipine     Swelling/'foggy sensation'   Avelox [Moxifloxacin Hcl]     Weakness and felt bad   Doxazosin     Explosive diarrhea    Hydralazine     anxiety   Iodine    Trimethoprim Other (See Comments) and Diarrhea    Extreme diarrhea     Immunization History  Administered Date(s) Administered   Fluad Quad(high Dose 65+) 09/15/2019   Influenza, High Dose Seasonal PF  09/04/2018   PFIZER(Purple Top)SARS-COV-2 Vaccination 08/06/2020, 08/30/2020   Zoster Recombinat (Shingrix) 09/09/2018, 11/19/2018    Past Medical History:  Diagnosis Date   Anemia    Anxiety    no meds   Arthritis    neck - otc med prn   Chest pain    a. 10/2017 MV: EF 55-65%, no ischemia, infarct.   Diabetes mellitus    type 2   Diastolic dysfunction    a. 10/2017 Echo: EF 55-60%, no rwma, Gr1 DD, mod dil LA, PASP 47mmHg.   GERD (gastroesophageal reflux disease)    Heart palpitations    hx - caused by medication   History of kidney stones    multiple kidney stones -passed some stones and surgery x 3    Hyperlipidemia    Hypertension    a. Difficult to control-->10/2017 Renal Duplex: < 60% bilat RAS.   Left breast mass    PAH (pulmonary artery hypertension) ([place])    a. 10/2017 Echo: PASP 43mmHg.   RBBB    Reflux    Seasonal allergies    Sleep apnea    wears CPAP nightly   SVD (spontaneous vaginal delivery)    x 3    Tobacco History: Social History   Tobacco Use  Smoking Status Never  Smokeless Tobacco Never   Counseling given: Not Answered   Outpatient Medications  Prior to Visit  Medication Sig Dispense Refill   aspirin EC 81 MG tablet Take 81 mg by mouth daily.     carvedilol (COREG) 12.5 MG tablet Take 12.5 mg by mouth 2 (two) times daily with a meal.     chlorthalidone (HYGROTON) 25 MG tablet Take 1/2 (one-half) tablet by mouth once daily 45 tablet 0   cloNIDine (CATAPRES - DOSED IN MG/24 HR) 0.1 mg/24hr patch APPLY 1 PATCH TOPICALLY ONCE A WEEK  2   loratadine (CLARITIN) 10 MG tablet Take 10 mg by mouth daily.     losartan (COZAAR) 100 MG tablet Take 100 mg by mouth at bedtime.      Multiple Vitamin (MULTIVITAMIN) capsule Take 1 capsule by mouth daily.      Polyethyl Glycol-Propyl Glycol (SYSTANE ULTRA OP) Place 1 drop into both eyes daily as needed (dry eyes).     pravastatin (PRAVACHOL) 40 MG tablet      diclofenac Sodium (VOLTAREN) 1 % GEL Apply 2 g  topically 4 (four) times daily. Rub into affected area of foot 2 to 4 times daily 100 g 2   ferrous sulfate 325 (65 FE) MG tablet Take 325 mg by mouth daily with breakfast.     pantoprazole (PROTONIX) 40 MG tablet Take 40 mg by mouth daily as needed.      vitamin B-12 (CYANOCOBALAMIN) 1000 MCG tablet Take 1,000 mcg by mouth daily.     Facility-Administered Medications Prior to Visit  Medication Dose Route Frequency Provider Last Rate Last Admin   triamcinolone acetonide (KENALOG) 10 MG/ML injection 10 mg  10 mg Other Once Trula Slade, DPM         Review of Systems:   Constitutional:   No  weight loss, night sweats,  Fevers, chills, fatigue, or  lassitude.  HEENT:   No headaches,  Difficulty swallowing,  Tooth/dental problems, or  Sore throat,                No sneezing, itching, ear ache, nasal congestion, post nasal drip,   CV:  No chest pain,  Orthopnea, PND, swelling in lower extremities, anasarca, dizziness, palpitations, syncope.   GI  No heartburn, indigestion, abdominal pain, nausea, vomiting, diarrhea, change in bowel habits, loss of appetite, bloody stools.   Resp: No shortness of breath with exertion or at rest.  No excess mucus, no productive cough,  No non-productive cough,  No coughing up of blood.  No change in color of mucus.  No wheezing.  No chest wall deformity  Skin: no rash or lesions.  GU: no dysuria, change in color of urine, no urgency or frequency.  No flank pain, no hematuria   MS:  No joint pain or swelling.  No decreased range of motion.  No back pain.    Physical Exam  BP 136/80 (BP Location: Left Arm, Patient Position: Sitting, Cuff Size: Normal)   Pulse 63   Temp (!) 97.1 F (36.2 C) (Oral)   Ht 5' 1.5" (1.562 m)   Wt 181 lb 6.4 oz (82.3 kg)   SpO2 100%   BMI 33.72 kg/m   GEN: A/Ox3; pleasant , NAD, well nourished , walks with cane    HEENT:  [place]/AT,  NOSE-clear, THROAT-clear, no lesions, no postnasal drip or exudate noted.   NECK:   Supple w/ fair ROM; no JVD; normal carotid impulses w/o bruits; no thyromegaly or nodules palpated; no lymphadenopathy.    RESP  Clear  P & A; w/o, wheezes/ rales/  or rhonchi. no accessory muscle use, no dullness to percussion  CARD:  RRR, no m/r/g, no peripheral edema, pulses intact, no cyanosis or clubbing.  GI:   Soft & nt; nml bowel sounds; no organomegaly or masses detected.   Musco: Warm bil, no deformities or joint swelling noted.   Neuro: alert, no focal deficits noted.    Skin: Warm, no lesions or rashes      BMET   BNP No results found for: BNP  ProBNP No results found for: PROBNP  Imaging: No results found.    No flowsheet data found.  No results found for: NITRICOXIDE      Assessment & Plan:   OSA (obstructive sleep apnea) Excellent compliance, has a few events .  Discussed changing mask, she wants to think about it .  Would consider the dream wear full face   Plan  Patient Instructions  Continue on CPAP at bedtime Call back if you want to change to Dream wear full face mask.  Keep up the good work Do not drive a sleepy Saline nasal rinses and gel.  Zyrtec 10mg  daily As needed   Flonase 1 puff daily for 1 week then as needed.  Mucinex DM Twice daily  As needed  Cough (can try the liquid)  Follow-up in 1 year with Dr. Mortimer Fries and As needed         Allergic rhinitis Under good control   Plan  Patient Instructions  Continue on CPAP at bedtime Call back if you want to change to Dream wear full face mask.  Keep up the good work Do not drive a sleepy Saline nasal rinses and gel.  Zyrtec 10mg  daily As needed   Flonase 1 puff daily for 1 week then as needed.  Mucinex DM Twice daily  As needed  Cough (can try the liquid)  Follow-up in 1 year with Dr. Mortimer Fries and As needed           Rexene Edison, NP 10/31/2021

## 2021-10-31 NOTE — Progress Notes (Signed)
Subjective: 80 y.o. returns the office today for painful, elongated, thickened toenails which she cannot trim herself as well as for calluses to both of her feet.  She is otherwise has been doing well and she does not need any injections into her feet today.  PCP: Isaias Sakai, DO  Objective: AAO 3, NAD-wearing orthotics DP/PT pulses palpable, CRT less than 3 seconds Nails hypertrophic, dystrophic, elongated, brittle, discolored 10. There is tenderness overlying the nails 1-5 bilaterally. There is no surrounding erythema or drainage along the nail sites. Hyperkeratotic lesions bilateral medial arch on the area of the talar prominence.  No ongoing ulceration, drainage or any signs of infection. Significant flatfoot is present No pain with calf compression, swelling, warmth, erythema.  Assessment: Patient presents with symptomatic onychomycosis, hyperkeratotic lesions  Plan: -Treatment options including alternatives, risks, complications were discussed -Nails sharply debrided 10 without complication/bleeding. -Hyperkeratotic lesion sharply debrided x2 without any complications or bleeding. -Continue shoe modifications, good arch support.  Discussed that if she is having injection before next appointment let us know as she is getting her feet more.  Trula Slade DPM

## 2021-10-31 NOTE — Patient Instructions (Addendum)
Continue on CPAP at bedtime Call back if you want to change to Dream wear full face mask.  Keep up the good work Do not drive a sleepy Saline nasal rinses and gel.  Zyrtec 10mg  daily As needed   Flonase 1 puff daily for 1 week then as needed.  Mucinex DM Twice daily  As needed  Cough (can try the liquid)  Follow-up in 1 year with Dr. Mortimer Fries and As needed

## 2021-10-31 NOTE — Assessment & Plan Note (Signed)
Under good control   Plan  Patient Instructions  Continue on CPAP at bedtime Call back if you want to change to Dream wear full face mask.  Keep up the good work Do not drive a sleepy Saline nasal rinses and gel.  Zyrtec 10mg  daily As needed   Flonase 1 puff daily for 1 week then as needed.  Mucinex DM Twice daily  As needed  Cough (can try the liquid)  Follow-up in 1 year with Dr. Mortimer Fries and As needed

## 2021-11-11 NOTE — Progress Notes (Signed)
Follow-up Outpatient Visit Date: 11/14/2021  Primary Care Provider: Leonides Sake, MD [place] [id number]  Chief Complaint: Follow-up hypertension  HPI:  Laura Garner is a 80 y.o. female with history of difficult to control hypertension, hyperlipidemia, type 2 diabetes mellitus, chronic kidney disease stage II, obesity, and GERD, who presents for follow-up of hypertension.  She was last seen in our office a year ago by Ignacia Bayley, NP, at which time she was doing well.  Blood pressure and hemoglobin A1c had improved with significant weight loss.  However, her blood pressure was quite elevated at our office visit, which she attributed to anxiety.  No medication changes or additional testing were pursued.  Today, Laura Garner reports that she has been feeling fairly well.  She has stable exertional dyspnea when walking to her mailbox as well as minimal leg edema, both of which have been unchanged since her last visit with [place].  She notes that her blood pressure and weight went up earlier this year due to inactivity and dietary indiscretion.  However, she is trying to use her recumbent bike more regularly again with the hopes of losing weight and improving her blood pressure.  Readings over the last 2 weeks have typically been in the 100s-120s/50s-70s, though one reading was up to 141/73.  She attributes her high blood pressure today to whitecoat hypertension.  She has not had any chest pain, palpitations, or lightheadedness.  --------------------------------------------------------------------------------------------------  Past Medical History:  Diagnosis Date   Anemia    Anxiety    no meds   Arthritis    neck - otc med prn   Chest pain    a. 10/2017 MV: EF 55-65%, no ischemia, infarct.   Diabetes mellitus    type 2   Diastolic dysfunction    a. 10/2017 Echo: EF 55-60%, no rwma, Gr1 DD, mod dil LA, PASP 63mmHg.   GERD (gastroesophageal reflux disease)    Heart palpitations     hx - caused by medication   History of kidney stones    multiple kidney stones -passed some stones and surgery x 3    Hyperlipidemia    Hypertension    a. Difficult to control-->10/2017 Renal Duplex: < 60% bilat RAS.   Left breast mass    PAH (pulmonary artery hypertension) ([place])    a. 10/2017 Echo: PASP 77mmHg.   RBBB    Reflux    Seasonal allergies    Sleep apnea    wears CPAP nightly   SVD (spontaneous vaginal delivery)    x 3   Past Surgical History:  Procedure Laterality Date   BREAST LUMPECTOMY WITH RADIOACTIVE SEED LOCALIZATION Left 07/19/2018   Procedure: LEFT BREAST LUMPECTOMY WITH RADIOACTIVE SEED LOCALIZATION ERAS PATHWAY;  Surgeon: Jovita Kussmaul, MD;  Location: [facility];  Service: General;  Laterality: Left;   HYSTEROSCOPY WITH D & C N/A 03/22/2017   Procedure: DILATATION AND CURETTAGE /HYSTEROSCOPY;  Surgeon: Newton Pigg, MD;  Location: [facility];  Service: Gynecology;  Laterality: N/A;   lithrotripsy     surgery x 3   right foot surgery     repair tendon   SHOULDER SURGERY     x 2 - right and left    Current Meds  Medication Sig   aspirin EC 81 MG tablet Take 81 mg by mouth daily.   carvedilol (COREG) 12.5 MG tablet Take 12.5 mg by mouth 2 (two) times daily with a meal.   chlorthalidone (HYGROTON)  25 MG tablet Take 1/2 (one-half) tablet by mouth once daily   cloNIDine (CATAPRES - DOSED IN MG/24 HR) 0.1 mg/24hr patch APPLY 1 PATCH TOPICALLY ONCE A WEEK   loratadine (CLARITIN) 10 MG tablet Take 10 mg by mouth daily.   losartan (COZAAR) 100 MG tablet Take 100 mg by mouth at bedtime.    Multiple Vitamin (MULTIVITAMIN) capsule Take 1 capsule by mouth daily.    NON FORMULARY Equalactin 2 tabs PO daily   Polyethyl Glycol-Propyl Glycol (SYSTANE ULTRA OP) Place 1 drop into both eyes daily as needed (dry eyes).   pravastatin (PRAVACHOL) 40 MG tablet     Allergies: Iodinated diagnostic agents, Amlodipine, Avelox [moxifloxacin hcl], Doxazosin,  Hydralazine, Iodine, and Trimethoprim  Social History   Tobacco Use   Smoking status: Never   Smokeless tobacco: Never  Vaping Use   Vaping Use: Never used  Substance Use Topics   Alcohol use: No    Alcohol/week: 0.0 standard drinks   Drug use: No    Family History  Problem Relation Age of Onset   Heart failure Mother    Hypertension Father     Review of Systems: A 12-system review of systems was performed and was negative except as noted in the HPI.  --------------------------------------------------------------------------------------------------  Physical Exam: BP (!) 150/70 (BP Location: Left Arm, Patient Position: Sitting, Cuff Size: Large)   Pulse (!) 57   Ht 5' 1.5" (1.562 m)   Wt 185 lb (83.9 kg)   SpO2 98%   BMI 34.39 kg/m   General:  NAD. Neck: No JVD or HJR. Lungs: Clear to auscultation bilaterally without wheezes or crackles. Heart: Bradycardic but regular without murmurs, rubs, or gallops. Abdomen: Soft, nontender, nondistended. Extremities: Trace chronic appearing pretibial edema.  EKG: Sinus bradycardia with left axis deviation and right bundle branch block.  No significant change from prior tracing on 10/29/2020.  Lab Results  Component Value Date   WBC 7.1 03/21/2017   HGB 11.4 (L) 03/21/2017   HCT 33.8 (L) 03/21/2017   MCV 96.8 03/21/2017   PLT 214 03/21/2017    Lab Results  Component Value Date   NA 143 07/10/2018   K 4.5 07/10/2018   CL 109 07/10/2018   CO2 24 07/10/2018   BUN 15 07/10/2018   CREATININE 0.85 07/10/2018   GLUCOSE 135 (H) 07/10/2018    No results found for: CHOL, HDL, LDLCALC, LDLDIRECT, TRIG, CHOLHDL  --------------------------------------------------------------------------------------------------  ASSESSMENT AND PLAN: Hypertension: Blood pressure mildly elevated today, similar to prior visits with [place].  Home blood pressure readings are typically much better, with Laura Garner reporting that her blood pressure cuff  has previously been correlated with providers cuffs.  She has a long history of whitecoat hypertension, which may be contributing.  We have agreed to attempt discontinuation of clonidine patch after the holidays.  She will stop clonidine patch in January and monitor her blood pressure at home.  If it is consistently above 140/90, she should alert [place] so that clonidine can be reinstituted.  Unfortunately, she does not have many options for alternative therapies given that she has been intolerant of amlodipine, doxazosin, and hydralazine.  We will continue current doses of carvedilol, chlorthalidone, and losartan, though we could consider escalation of chlorthalidone in the future if necessary.  Hyperlipidemia associated with type 2 diabetes mellitus: Continue pravastatin with ongoing management per PCP.  Chronic kidney disease stage II: No recent labs in our system.  Continue close follow-up with Dr. Tera Mater.  Follow-up: Return to clinic in  1 year.  Nelva Bush, MD 11/14/2021 8:17 AM

## 2021-11-14 ENCOUNTER — Ambulatory Visit (INDEPENDENT_AMBULATORY_CARE_PROVIDER_SITE_OTHER): Payer: Medicare Other | Admitting: Internal Medicine

## 2021-11-14 ENCOUNTER — Other Ambulatory Visit: Payer: Self-pay

## 2021-11-14 ENCOUNTER — Encounter: Payer: Self-pay | Admitting: Internal Medicine

## 2021-11-14 VITALS — BP 150/70 | HR 57 | Ht 61.5 in | Wt 185.0 lb

## 2021-11-14 DIAGNOSIS — N182 Chronic kidney disease, stage 2 (mild): Secondary | ICD-10-CM | POA: Diagnosis not present

## 2021-11-14 DIAGNOSIS — I10 Essential (primary) hypertension: Secondary | ICD-10-CM

## 2021-11-14 DIAGNOSIS — E1169 Type 2 diabetes mellitus with other specified complication: Secondary | ICD-10-CM | POA: Insufficient documentation

## 2021-11-14 DIAGNOSIS — E785 Hyperlipidemia, unspecified: Secondary | ICD-10-CM | POA: Diagnosis not present

## 2021-11-14 NOTE — Patient Instructions (Signed)
Medication Instructions:   Your physician recommends that you continue on your current medications as directed. Please refer to the Current Medication list given to you today.  You may stop taking your Clonidine patch early January  Please continue to monitor your blood pressure at home and contact our office if your BP is consistently greater than 140/90.  *If you need a refill on your cardiac medications before your next appointment, please call your pharmacy*   Lab Work:  None ordered  Testing/Procedures:  None ordered   Follow-Up: At [facility], you and your health needs are our priority.  As part of our continuing mission to provide you with exceptional heart care, we have created designated Provider Care Teams.  These Care Teams include your primary Cardiologist (physician) and Advanced Practice Providers (APPs -  Physician Assistants and Nurse Practitioners) who all work together to provide you with the care you need, when you need it.  We recommend signing up for the patient portal called "MyChart".  Sign up information is provided on this After Visit Summary.  MyChart is used to connect with patients for Virtual Visits (Telemedicine).  Patients are able to view lab/test results, encounter notes, upcoming appointments, etc.  Non-urgent messages can be sent to your provider as well.   To learn more about what you can do with MyChart, go to [email or web address].    Your next appointment:   1 year(s)  The format for your next appointment:   In Person  Provider:   You may see Nelva Bush, MD or one of the following Advanced Practice Providers on your designated Care Team:   Murray Hodgkins, NP Christell Faith, PA-C Cadence Kathlen Mody, [place]

## 2021-11-17 DIAGNOSIS — R195 Other fecal abnormalities: Secondary | ICD-10-CM | POA: Diagnosis not present

## 2021-11-17 DIAGNOSIS — D649 Anemia, unspecified: Secondary | ICD-10-CM | POA: Diagnosis not present

## 2021-11-17 DIAGNOSIS — K580 Irritable bowel syndrome with diarrhea: Secondary | ICD-10-CM | POA: Diagnosis not present

## 2021-11-21 DIAGNOSIS — E119 Type 2 diabetes mellitus without complications: Secondary | ICD-10-CM | POA: Diagnosis not present

## 2021-12-01 ENCOUNTER — Other Ambulatory Visit: Payer: Self-pay | Admitting: Nurse Practitioner

## 2021-12-20 DIAGNOSIS — E782 Mixed hyperlipidemia: Secondary | ICD-10-CM | POA: Diagnosis not present

## 2021-12-20 DIAGNOSIS — N189 Chronic kidney disease, unspecified: Secondary | ICD-10-CM | POA: Diagnosis not present

## 2021-12-20 DIAGNOSIS — E1149 Type 2 diabetes mellitus with other diabetic neurological complication: Secondary | ICD-10-CM | POA: Diagnosis not present

## 2021-12-28 DIAGNOSIS — E1149 Type 2 diabetes mellitus with other diabetic neurological complication: Secondary | ICD-10-CM | POA: Diagnosis not present

## 2021-12-28 DIAGNOSIS — N1831 Chronic kidney disease, stage 3a: Secondary | ICD-10-CM | POA: Diagnosis not present

## 2021-12-28 DIAGNOSIS — E782 Mixed hyperlipidemia: Secondary | ICD-10-CM | POA: Diagnosis not present

## 2021-12-28 DIAGNOSIS — I10 Essential (primary) hypertension: Secondary | ICD-10-CM | POA: Diagnosis not present

## 2021-12-28 DIAGNOSIS — Z6834 Body mass index (BMI) 34.0-34.9, adult: Secondary | ICD-10-CM | POA: Diagnosis not present

## 2022-01-02 ENCOUNTER — Ambulatory Visit: Payer: Medicare Other | Admitting: Podiatry

## 2022-01-06 DIAGNOSIS — D124 Benign neoplasm of descending colon: Secondary | ICD-10-CM | POA: Diagnosis not present

## 2022-01-06 DIAGNOSIS — K635 Polyp of colon: Secondary | ICD-10-CM | POA: Diagnosis not present

## 2022-01-06 DIAGNOSIS — K5730 Diverticulosis of large intestine without perforation or abscess without bleeding: Secondary | ICD-10-CM | POA: Diagnosis not present

## 2022-01-09 ENCOUNTER — Other Ambulatory Visit: Payer: Self-pay

## 2022-01-09 ENCOUNTER — Ambulatory Visit (INDEPENDENT_AMBULATORY_CARE_PROVIDER_SITE_OTHER): Payer: Medicare Other | Admitting: Podiatry

## 2022-01-09 ENCOUNTER — Encounter: Payer: Self-pay | Admitting: Podiatry

## 2022-01-09 DIAGNOSIS — E119 Type 2 diabetes mellitus without complications: Secondary | ICD-10-CM | POA: Diagnosis not present

## 2022-01-09 DIAGNOSIS — B351 Tinea unguium: Secondary | ICD-10-CM | POA: Diagnosis not present

## 2022-01-09 DIAGNOSIS — Q828 Other specified congenital malformations of skin: Secondary | ICD-10-CM | POA: Diagnosis not present

## 2022-01-09 DIAGNOSIS — M79674 Pain in right toe(s): Secondary | ICD-10-CM

## 2022-01-09 DIAGNOSIS — M79675 Pain in left toe(s): Secondary | ICD-10-CM | POA: Diagnosis not present

## 2022-01-12 NOTE — Progress Notes (Signed)
Subjective: 81 y.o. returns the office today for painful, elongated, thickened toenails which she cannot trim herself as well as for calluses to both of her feet.  She is otherwise has been doing well and her feet have been hurting overall.  No recent injury or changes.  PCP: Isaias Sakai, DO  Objective: AAO 3, NAD-wearing orthotics DP/PT pulses palpable, CRT less than 3 seconds Nails hypertrophic, dystrophic, elongated, brittle, discolored 10. There is tenderness overlying the nails 1-5 bilaterally. There is no surrounding erythema or drainage along the nail sites. Hyperkeratotic lesions bilateral medial arch on the area of the talar prominence.  No ongoing ulceration, drainage or any signs of infection.  The areas are preulcerative. Significant flatfoot is present No pain with calf compression, swelling, warmth, erythema.  Assessment: Patient presents with symptomatic onychomycosis, hyperkeratotic lesions  Plan: -Treatment options including alternatives, risks, complications were discussed -Nails sharply debrided 10 without complication/bleeding. -Hyperkeratotic lesion sharply debrided x2 without any complications or bleeding. -Continue shoe modifications, good arch support.  Discussed that if she is having injection before next appointment let us know as she is getting her feet more.  Trula Slade DPM

## 2022-03-13 ENCOUNTER — Ambulatory Visit (INDEPENDENT_AMBULATORY_CARE_PROVIDER_SITE_OTHER): Payer: Medicare Other | Admitting: Podiatry

## 2022-03-13 ENCOUNTER — Other Ambulatory Visit: Payer: Self-pay

## 2022-03-13 DIAGNOSIS — M79674 Pain in right toe(s): Secondary | ICD-10-CM | POA: Diagnosis not present

## 2022-03-13 DIAGNOSIS — B351 Tinea unguium: Secondary | ICD-10-CM | POA: Diagnosis not present

## 2022-03-13 DIAGNOSIS — M79675 Pain in left toe(s): Secondary | ICD-10-CM

## 2022-03-13 DIAGNOSIS — E119 Type 2 diabetes mellitus without complications: Secondary | ICD-10-CM | POA: Diagnosis not present

## 2022-03-13 DIAGNOSIS — Q828 Other specified congenital malformations of skin: Secondary | ICD-10-CM | POA: Diagnosis not present

## 2022-03-14 NOTE — Progress Notes (Signed)
Subjective: ?81 y.o. returns the office today for painful, elongated, thickened toenails which she cannot trim herself as well as for calluses to both of her feet.  She did review been doing well otherwise.  She did wear some shoes for short time where she more dress style she has some pain that day but the next day the pain resolved.  She does not think that her symptoms are bad enough for an injection today. ? ?PCP: Isaias Sakai, DO ? ?Objective: ?AAO ?3, NAD-wearing orthotics ?DP/PT pulses palpable, CRT less than 3 seconds ?Nails hypertrophic, dystrophic, elongated, brittle, discolored ?10. There is tenderness overlying the nails 1-5 bilaterally. There is no surrounding erythema or drainage along the nail sites. ?Hyperkeratotic lesions bilateral medial arch on the area of the talar prominence.  No ongoing ulceration, drainage or any signs of infection.  The areas are preulcerative. ?Significant flatfoot is present ?No pain with calf compression, swelling, warmth, erythema. ? ?Assessment: ?Patient presents with symptomatic onychomycosis, hyperkeratotic lesions ? ?Plan: ?-Treatment options including alternatives, risks, complications were discussed ?-Nails sharply debrided ?10 without complication/bleeding. ?-Hyperkeratotic lesion sharply debrided x 2 without any complications or bleeding. ?-Continue shoe modifications, good arch support.  Discussed steroid injection next appointment as she is to be on her feet more wearing different shoes for an upcoming wedding. ? ?Trula Slade DPM ? ?

## 2022-03-20 DIAGNOSIS — M9904 Segmental and somatic dysfunction of sacral region: Secondary | ICD-10-CM | POA: Diagnosis not present

## 2022-03-20 DIAGNOSIS — M47814 Spondylosis without myelopathy or radiculopathy, thoracic region: Secondary | ICD-10-CM | POA: Diagnosis not present

## 2022-03-20 DIAGNOSIS — M9902 Segmental and somatic dysfunction of thoracic region: Secondary | ICD-10-CM | POA: Diagnosis not present

## 2022-03-20 DIAGNOSIS — M47897 Other spondylosis, lumbosacral region: Secondary | ICD-10-CM | POA: Diagnosis not present

## 2022-03-20 DIAGNOSIS — M9903 Segmental and somatic dysfunction of lumbar region: Secondary | ICD-10-CM | POA: Diagnosis not present

## 2022-03-20 DIAGNOSIS — M5388 Other specified dorsopathies, sacral and sacrococcygeal region: Secondary | ICD-10-CM | POA: Diagnosis not present

## 2022-04-21 DIAGNOSIS — Z20822 Contact with and (suspected) exposure to covid-19: Secondary | ICD-10-CM | POA: Diagnosis not present

## 2022-04-27 DIAGNOSIS — E1149 Type 2 diabetes mellitus with other diabetic neurological complication: Secondary | ICD-10-CM | POA: Diagnosis not present

## 2022-04-27 DIAGNOSIS — E782 Mixed hyperlipidemia: Secondary | ICD-10-CM | POA: Diagnosis not present

## 2022-04-27 DIAGNOSIS — N189 Chronic kidney disease, unspecified: Secondary | ICD-10-CM | POA: Diagnosis not present

## 2022-05-05 DIAGNOSIS — D631 Anemia in chronic kidney disease: Secondary | ICD-10-CM | POA: Diagnosis not present

## 2022-05-05 DIAGNOSIS — E1149 Type 2 diabetes mellitus with other diabetic neurological complication: Secondary | ICD-10-CM | POA: Diagnosis not present

## 2022-05-05 DIAGNOSIS — N1832 Chronic kidney disease, stage 3b: Secondary | ICD-10-CM | POA: Diagnosis not present

## 2022-05-05 DIAGNOSIS — I10 Essential (primary) hypertension: Secondary | ICD-10-CM | POA: Diagnosis not present

## 2022-05-05 DIAGNOSIS — E782 Mixed hyperlipidemia: Secondary | ICD-10-CM | POA: Diagnosis not present

## 2022-05-05 DIAGNOSIS — N189 Chronic kidney disease, unspecified: Secondary | ICD-10-CM | POA: Diagnosis not present

## 2022-05-05 DIAGNOSIS — F320 Major depressive disorder, single episode, mild: Secondary | ICD-10-CM | POA: Diagnosis not present

## 2022-05-08 ENCOUNTER — Ambulatory Visit (INDEPENDENT_AMBULATORY_CARE_PROVIDER_SITE_OTHER): Payer: Medicare Other | Admitting: Podiatry

## 2022-05-08 DIAGNOSIS — B351 Tinea unguium: Secondary | ICD-10-CM

## 2022-05-08 DIAGNOSIS — L84 Corns and callosities: Secondary | ICD-10-CM | POA: Diagnosis not present

## 2022-05-08 DIAGNOSIS — M79674 Pain in right toe(s): Secondary | ICD-10-CM

## 2022-05-08 DIAGNOSIS — M779 Enthesopathy, unspecified: Secondary | ICD-10-CM

## 2022-05-08 DIAGNOSIS — M79675 Pain in left toe(s): Secondary | ICD-10-CM

## 2022-05-09 NOTE — Progress Notes (Signed)
Subjective: 81 y.o. returns the office today for painful, elongated, thickened toenails which she cannot trim herself as well as for calluses to both of her feet.  She does request steroid injections today as the feet are starting get more tender.  She is also starting to notice some mild discomfort on the medial aspect of the foot.  No recent injury or trauma.  PCP: Isaias Sakai, DO  Objective: AAO 3, NAD-wearing orthotics DP/PT pulses palpable, CRT less than 3 seconds Nails hypertrophic, dystrophic, elongated, brittle, discolored 10. There is tenderness overlying the nails 1-5 bilaterally. There is no surrounding erythema or drainage along the nail sites. Hyperkeratotic lesions bilateral medial arch on the area of the talar prominence.  No ongoing ulceration, drainage or any signs of infection.  The areas are preulcerative. Significant flatfoot is present.  Tenderness palpation of the sinus tarsi bilaterally today.  Mild discomfort subjectively in the course of the posterior tibial tendon but minimal discomfort today.  There is no edema, erythema.  No area pinpoint tenderness. No pain with calf compression, swelling, warmth, erythema.  Assessment: Patient presents with symptomatic onychomycosis, hyperkeratotic lesions; capsulitis, posterior tibial tendon dysfunction  Plan: -Treatment options including alternatives, risks, complications were discussed -Nails sharply debrided 10 without complication/bleeding. -Hyperkeratotic lesion sharply debrided x 2 without any complications or bleeding. -Steroid injection for the sinus tarsi bilaterally.  The skin was cleaned with alcohol and mixture of 1 cc Kenalog 10, 0.5 cc of Marcaine plain, 0.5 cc lidocaine plain was infiltrated complications.  Postinjection care discussed. -Continue shoe modifications, good arch support.  Discussed steroid injection next appointment as she is to be on her feet more wearing different shoes for an  upcoming wedding.  Trula Slade DPM

## 2022-07-10 ENCOUNTER — Ambulatory Visit (INDEPENDENT_AMBULATORY_CARE_PROVIDER_SITE_OTHER): Payer: Medicare Other | Admitting: Podiatry

## 2022-07-10 DIAGNOSIS — M79674 Pain in right toe(s): Secondary | ICD-10-CM

## 2022-07-10 DIAGNOSIS — E119 Type 2 diabetes mellitus without complications: Secondary | ICD-10-CM

## 2022-07-10 DIAGNOSIS — Q828 Other specified congenital malformations of skin: Secondary | ICD-10-CM

## 2022-07-10 DIAGNOSIS — B351 Tinea unguium: Secondary | ICD-10-CM | POA: Diagnosis not present

## 2022-07-10 DIAGNOSIS — M79675 Pain in left toe(s): Secondary | ICD-10-CM

## 2022-07-10 NOTE — Progress Notes (Signed)
Subjective: 81 y.o. returns the office today for painful, elongated, thickened toenails which she cannot trim herself as well as for calluses to both of her feet.  States she gets some pain on the inside aspect of her foot.  She also felt that She is getting some pain on the bottom of her left heel and she felt it was a stone bruise.  No injuries that she reports.  PCP: Isaias Sakai, DO  Objective: AAO 3, NAD-wearing orthotics DP/PT pulses palpable, CRT less than 3 seconds Nails hypertrophic, dystrophic, elongated, brittle, discolored 10. There is tenderness overlying the nails 1-5 bilaterally. There is no surrounding erythema or drainage along the nail sites. Hyperkeratotic lesions bilateral medial arch on the area of the talar prominence.  No ongoing ulceration, drainage or any signs of infection.  The areas are preulcerative. Significant flatfoot is present.  No significant tenderness in the sinus tarsi today.  Subjectively she was given some discomfort in the plantar aspect of calcaneus on the left foot.  There is atrophy of the fat pad no area pinpoint tenderness. No pain with calf compression, swelling, warmth, erythema.  Assessment: Patient presents with symptomatic onychomycosis, hyperkeratotic lesions; heel pain  Plan: -Treatment options including alternatives, risks, complications were discussed -Nails sharply debrided 10 without complication/bleeding. -Hyperkeratotic lesion sharply debrided x 2 without any complications or bleeding.  Continue moisturizer, offloading. -Recommend gel heel cups.  Continue supportive shoe gear.  Trula Slade DPM

## 2022-08-29 ENCOUNTER — Emergency Department (HOSPITAL_COMMUNITY): Payer: Medicare Other

## 2022-08-29 ENCOUNTER — Encounter (HOSPITAL_COMMUNITY): Payer: Self-pay | Admitting: Emergency Medicine

## 2022-08-29 ENCOUNTER — Other Ambulatory Visit: Payer: Self-pay

## 2022-08-29 ENCOUNTER — Emergency Department (HOSPITAL_COMMUNITY)
Admission: EM | Admit: 2022-08-29 | Discharge: 2022-08-29 | Disposition: A | Discharge status: Home / Self Care | Payer: Medicare Other | Attending: Emergency Medicine | Admitting: Emergency Medicine

## 2022-08-29 DIAGNOSIS — Z79899 Other long term (current) drug therapy: Secondary | ICD-10-CM | POA: Diagnosis not present

## 2022-08-29 DIAGNOSIS — N3000 Acute cystitis without hematuria: Secondary | ICD-10-CM | POA: Insufficient documentation

## 2022-08-29 DIAGNOSIS — R079 Chest pain, unspecified: Secondary | ICD-10-CM | POA: Diagnosis not present

## 2022-08-29 DIAGNOSIS — Z7982 Long term (current) use of aspirin: Secondary | ICD-10-CM | POA: Insufficient documentation

## 2022-08-29 DIAGNOSIS — I129 Hypertensive chronic kidney disease with stage 1 through stage 4 chronic kidney disease, or unspecified chronic kidney disease: Secondary | ICD-10-CM | POA: Diagnosis not present

## 2022-08-29 DIAGNOSIS — E1122 Type 2 diabetes mellitus with diabetic chronic kidney disease: Secondary | ICD-10-CM | POA: Insufficient documentation

## 2022-08-29 DIAGNOSIS — L03116 Cellulitis of left lower limb: Secondary | ICD-10-CM | POA: Insufficient documentation

## 2022-08-29 DIAGNOSIS — I10 Essential (primary) hypertension: Secondary | ICD-10-CM

## 2022-08-29 DIAGNOSIS — R42 Dizziness and giddiness: Secondary | ICD-10-CM | POA: Insufficient documentation

## 2022-08-29 DIAGNOSIS — D72829 Elevated white blood cell count, unspecified: Secondary | ICD-10-CM | POA: Insufficient documentation

## 2022-08-29 DIAGNOSIS — N189 Chronic kidney disease, unspecified: Secondary | ICD-10-CM | POA: Insufficient documentation

## 2022-08-29 DIAGNOSIS — R519 Headache, unspecified: Secondary | ICD-10-CM | POA: Diagnosis not present

## 2022-08-29 LAB — COMPREHENSIVE METABOLIC PANEL
ALT: 13 U/L (ref 0–44)
AST: 20 U/L (ref 15–41)
Albumin: 3.9 g/dL (ref 3.5–5.0)
Alkaline Phosphatase: 79 U/L (ref 38–126)
Anion gap: 11 (ref 5–15)
BUN: 17 mg/dL (ref 8–23)
CO2: 23 mmol/L (ref 22–32)
Calcium: 9.9 mg/dL (ref 8.9–10.3)
Chloride: 105 mmol/L (ref 98–111)
Creatinine, Ser: 0.9 mg/dL (ref 0.44–1.00)
GFR, Estimated: >60 mL/min (ref >60)
Glucose, Bld: 136 mg/dL — ABNORMAL HIGH (ref 70–99)
Potassium: 3.7 mmol/L (ref 3.5–5.1)
Sodium: 139 mmol/L (ref 135–145)
Total Bilirubin: 1.4 mg/dL — ABNORMAL HIGH (ref 0.3–1.2)
Total Protein: 7 g/dL (ref 6.5–8.1)

## 2022-08-29 LAB — CBC WITH DIFFERENTIAL/PLATELET
Abs Immature Granulocytes: 0.06 10*3/uL (ref 0.00–0.07)
Basophils Absolute: 0.1 10*3/uL (ref 0.0–0.1)
Basophils Relative: 1 %
Eosinophils Absolute: 0.1 10*3/uL (ref 0.0–0.5)
Eosinophils Relative: 1 %
HCT: 31.3 % — ABNORMAL LOW (ref 36.0–46.0)
Hemoglobin: 10.8 g/dL — ABNORMAL LOW (ref 12.0–15.0)
Immature Granulocytes: 1 %
Lymphocytes Relative: 21 %
Lymphs Abs: 1.8 10*3/uL (ref 0.7–4.0)
MCH: 33.3 pg (ref 26.0–34.0)
MCHC: 34.5 g/dL (ref 30.0–36.0)
MCV: 96.6 fL (ref 80.0–100.0)
Monocytes Absolute: 0.7 10*3/uL (ref 0.1–1.0)
Monocytes Relative: 9 %
Neutro Abs: 5.9 10*3/uL (ref 1.7–7.7)
Neutrophils Relative %: 67 %
Platelets: 210 10*3/uL (ref 150–400)
RBC: 3.24 MIL/uL — ABNORMAL LOW (ref 3.87–5.11)
RDW: 12 % (ref 11.5–15.5)
WBC: 8.6 10*3/uL (ref 4.0–10.5)
nRBC: 0 % (ref 0.0–0.2)

## 2022-08-29 LAB — URINALYSIS, ROUTINE W REFLEX MICROSCOPIC
Bilirubin Urine: NEGATIVE
Glucose, UA: NEGATIVE mg/dL
Hgb urine dipstick: NEGATIVE
Ketones, ur: NEGATIVE mg/dL
Nitrite: NEGATIVE
Protein, ur: 100 mg/dL — AB
Specific Gravity, Urine: 1.013 (ref 1.005–1.030)
pH: 5 (ref 5.0–8.0)

## 2022-08-29 LAB — TROPONIN I (HIGH SENSITIVITY)
Troponin I (High Sensitivity): 12 ng/L (ref <18)
Troponin I (High Sensitivity): 12 ng/L (ref <18)

## 2022-08-29 MED ORDER — CHLORTHALIDONE 25 MG PO TABS: 25.0000 mg | ORAL_TABLET | Freq: Every day | ORAL | 0 refills | Status: DC

## 2022-08-29 MED ORDER — CEPHALEXIN 500 MG PO CAPS: 500.0000 mg | ORAL_CAPSULE | Freq: Three times a day (TID) | ORAL | 0 refills | Status: DC

## 2022-08-29 MED ORDER — CEPHALEXIN 250 MG PO CAPS
500.0000 mg | ORAL_CAPSULE | Freq: Once | ORAL | Status: AC
  Administered 2022-08-29: 500 mg via ORAL
  Filled 2022-08-29: qty 2

## 2022-08-29 MED ORDER — IBUPROFEN 800 MG PO TABS
800.0000 mg | ORAL_TABLET | Freq: Once | ORAL | Status: AC
  Administered 2022-08-29: 800 mg via ORAL
  Filled 2022-08-29: qty 1

## 2022-08-29 NOTE — ED Notes (Signed)
Pt coming form X-ray to room.

## 2022-08-29 NOTE — ED Provider Notes (Cosign Needed Addendum)
[facility] DEPARTMENT Provider Note   CSN: [id number] Arrival date & time: 08/29/22  1717     History  Chief Complaint  Patient presents with   Hypertension    Laura Garner is a 81 y.o. female with a past medical history of diabetes, hypertension, hyperlipidemia and chronic kidney disease presenting today due to hypertension.  She says that over the weekend she was feeling somewhat "groggy" and took her blood pressure and noted systolics in the 767M.  This morning she felt similarly.  Denies any severe headaches, blurred vision, chest pain or back pain.  Does say that occasionally she feels some pressure in her chest when her blood pressure is elevated.  Has been adherent to her outpatient medication regimen.   Hypertension       Home Medications Prior to Admission medications   Medication Sig Start Date End Date Taking? Authorizing Provider  aspirin EC 81 MG tablet Take 81 mg by mouth daily.    [provider]  carvedilol (COREG) 12.5 MG tablet Take 12.5 mg by mouth 2 (two) times daily with a meal.    [provider]  chlorthalidone (HYGROTON) 25 MG tablet Take 1/2 (one-half) tablet by mouth once daily 12/01/21   Theora Gianotti, NP  cloNIDine (CATAPRES - DOSED IN MG/24 HR) 0.1 mg/24hr patch APPLY 1 PATCH TOPICALLY ONCE A WEEK 10/14/18   [provider]  loratadine (CLARITIN) 10 MG tablet Take 10 mg by mouth daily.    [provider]  losartan (COZAAR) 100 MG tablet Take 100 mg by mouth at bedtime.     [provider]  Multiple Vitamin (MULTIVITAMIN) capsule Take 1 capsule by mouth daily.     [provider]  NON FORMULARY Equalactin 2 tabs PO daily    [provider]  Polyethyl Glycol-Propyl Glycol (SYSTANE ULTRA OP) Place 1 drop into both eyes daily as needed (dry eyes).    [provider]  pravastatin (PRAVACHOL) 40 MG tablet  07/23/20   [provider]       Allergies    Iodinated contrast media, Amlodipine, Avelox [moxifloxacin hcl], Doxazosin, Hydralazine, Iodine, and Trimethoprim    Review of Systems   Review of Systems  Physical Exam Updated Vital Signs BP (!) 202/72   Pulse 72   Temp 98.9 F (37.2 C) (Oral)   Resp 18   SpO2 99%  Physical Exam Vitals and nursing note reviewed.  Constitutional:      General: She is not in acute distress.    Appearance: Normal appearance. She is not ill-appearing.  HENT:     Head: Normocephalic and atraumatic.  Eyes:     General: No scleral icterus.    Extraocular Movements: Extraocular movements intact.     Conjunctiva/sclera: Conjunctivae normal.     Pupils: Pupils are equal, round, and reactive to light.  Cardiovascular:     Rate and Rhythm: Normal rate and regular rhythm.  Pulmonary:     Effort: Pulmonary effort is normal. No respiratory distress.  Skin:    General: Skin is warm and dry.     Findings: No rash.  Neurological:     Mental Status: She is alert.     Comments: Cranial nerves II through XII grossly intact.  Moving all extremities, 5 out of 5 strength in bilateral upper and lower extremities.  No problem with finger-nose.  Psychiatric:        Mood and Affect: Mood normal.  ED Results / Procedures / Treatments   Labs (all labs ordered are listed, but only abnormal results are displayed) Labs Reviewed  COMPREHENSIVE METABOLIC PANEL - Abnormal; Notable for the following components:      Result Value   Glucose, Bld 136 (*)    Total Bilirubin 1.4 (*)    All other components within normal limits  CBC WITH DIFFERENTIAL/PLATELET - Abnormal; Notable for the following components:   RBC 3.24 (*)    Hemoglobin 10.8 (*)    HCT 31.3 (*)    All other components within normal limits  URINALYSIS, ROUTINE W REFLEX MICROSCOPIC - Abnormal; Notable for the following components:   Protein, ur 100 (*)    Leukocytes,Ua MODERATE (*)    Bacteria, UA RARE (*)    All other components  within normal limits  URINE CULTURE  TROPONIN I (HIGH SENSITIVITY)  TROPONIN I (HIGH SENSITIVITY)    EKG None  Radiology CT Head Wo Contrast  Result Date: 08/29/2022 CLINICAL DATA:  Hypertension, dizziness, headache and intermittent gait disturbance. EXAM: CT HEAD WITHOUT CONTRAST TECHNIQUE: Contiguous axial images were obtained from the base of the skull through the vertex without intravenous contrast. RADIATION DOSE REDUCTION: This exam was performed according to the departmental dose-optimization program which includes automated exposure control, adjustment of the mA and/or kV according to patient size and/or use of iterative reconstruction technique. COMPARISON:  None Available. FINDINGS: Brain: No evidence of acute infarction, hemorrhage, hydrocephalus, extra-axial collection or mass lesion/mass effect. Mild cortical atrophy appears appropriate for age. Mild small vessel disease in the periventricular white matter and also potentially the pons. Vascular: No hyperdense vessel or unexpected calcification. Skull: Normal. Negative for fracture or focal lesion. Sinuses/Orbits: No acute finding. Other: None. IMPRESSION: Atrophy and small vessel disease.  No acute findings. Electronically Signed   By: Aletta Edouard M.D.   On: 08/29/2022 19:01   DG Chest 2 View  Result Date: 08/29/2022 CLINICAL DATA:  Chest pain EXAM: CHEST - 2 VIEW COMPARISON:  05/24/2004 FINDINGS: The heart size and mediastinal contours are within normal limits. Both lungs are clear. The visualized skeletal structures are unremarkable. IMPRESSION: No active cardiopulmonary disease. Electronically Signed   By: Franchot Gallo M.D.   On: 08/29/2022 18:47    Procedures Procedures   Medications Ordered in ED Medications  cephALEXin (KEFLEX) capsule 500 mg (500 mg Oral Given 08/29/22 2054)  ibuprofen (ADVIL) tablet 800 mg (800 mg Oral Given 08/29/22 2055)    ED Course/ Medical Decision Making/ A&P                            Medical Decision Making Amount and/or Complexity of Data Reviewed Labs: ordered.  Risk Prescription drug management.   This is a 81 year old female with a past medical history of hypertension who presents to the ED for concern of HTN.  Considerations include but are not limited to hypertensive urgency, hypertensive emergency, ACS, intracranial hemorrhage, kidney failure.   This is not an exhaustive differential.    Past Medical History / Co-morbidities / Social History: HTN, HLD   Additional history: Per chart review, patient has had persistent hypertension for at least the past year.  Her blood pressure cuffs have been checked multiple times and cardiology and PCP believe her hypertension to be worsened by "whitecoat syndrome."  During her visit at the end of November she was stopped on her clonidine patch.  They told her if it was consistently above 140/90  she should restart the clonidine patch.  History of intolerance to amlodipine, doxazosin and hydralazine.  Continues to take carvedilol, losartan and chlorthalidone.    Physical Exam: Pertinent physical exam findings include Benign neuro exam  Lab Tests: I ordered, and personally interpreted labs.  The pertinent results include: Leukocyte + bacteruria  Trop - x1   Imaging Studies: Imaging ordered in triage.  Viewed and interpreted by me.  Follow-up small vessel disease noted on head CT and negative chest x-ray.   Cardiac Monitoring:  The patient was maintained on a cardiac monitor.  My attending physician Dr. Alvino Chapel viewed and interpreted the cardiac monitored which showed an underlying rhythm of: NSR   Medications: I ordered medication including ibuprofen and keflex. Reevaluation of the patient after these medicines showed that the patient improved. I have reviewed the patients home medicines and have made adjustments as needed.    MDM/Disposition: This is a 81 year old female with a past medical history of  hypertension presenting today with the same.  Reported a dull headache as well.  Work-up negative for hypertensive emergency.  Troponin negative, EKG without ischemia.  No hemorrhage on CT.  No abdominal or back pain.  Patient's UA was with leukocyte positive bacteriuria.  On further history taking she does report some urinary urgency and frequency.  I will treat this with Keflex.  She also had borderline superficial cellulitis to the left anterior shin.  This will cover her for this as well.  At this time patient's hypertensive work-up is negative.  Pressure was not acutely lowered as there are no signs of hypertensive emergency.  Risks outweigh the benefit.  I reviewed her cardiology note from November and they planned to increase her chlorthalidone if she was unable to get good regulation of her blood pressure at home.  She is currently on 12.5 mg.  Per her research, the maximum dose is 25 mg or taking the whole tablet.  I will do this as they planned and she will call cardiology in the morning to inform them of the change.  She and her daughter are agreeable to the plan.  Will be discharged at this time   I discussed this case with my attending physician Dr. Alvino Chapel who cosigned this note including patient's presenting symptoms, physical exam, and planned diagnostics and interventions. Attending physician stated agreement with plan or made changes to plan which were implemented.     Final Clinical Impression(s) / ED Diagnoses Final diagnoses:  Cellulitis of left lower extremity  Acute cystitis without hematuria  Essential hypertension    Rx / DC Orders ED Discharge Orders          Ordered    cephALEXin (KEFLEX) 500 MG capsule  3 times daily        08/29/22 1948           Results and diagnoses were explained to the patient. Return precautions discussed in full. Patient had no additional questions and expressed complete understanding.   This chart was dictated using voice recognition  software.  Despite best efforts to proofread,  errors can occur which can change the documentation meaning.    Rhae Hammock, PA-C 08/29/22 2113    Darliss Ridgel 08/29/22 2142    Davonna Belling, MD 08/30/22 [phone]

## 2022-08-29 NOTE — Discharge Instructions (Addendum)
You saw your cardiologist in November and they documented that if there was further trouble regulating her blood pressure they would increase your chlorthalidone.  At this time, I want you to take a full tablet instead of 1/2 tablet.  This is equivalent to 25 mg.  Please call your cardiologist first thing tomorrow morning and tell them about your hypertension and emergency room visit.  I also wrote a new prescription for this so you do not run out now that you are taking a doubled dose.  The antibiotic that we discussed is at your pharmacy.  This is to treat both the urinary tract infection and the skin infection in your left lower leg.  It is very important for you to return with any severe headache, blurred vision, chest pain, back pain or overall worsening of your symptoms.  It was a pleasure to meet you both and I hope that you feel better!

## 2022-08-29 NOTE — ED Triage Notes (Signed)
Patient complains of hypertension and left leg bruising after a fall a few weeks ago. Patient also complains of dizziness and headache, history of hypertension and has not missed any doses of her antihypertensive medications. Patient is alert, oriented, and in no apparent distress at this time.

## 2022-08-29 NOTE — ED Provider Triage Note (Addendum)
Emergency Medicine Provider Triage Evaluation Note  Laura Garner , a 81 y.o. female  was evaluated in triage.  Pt complains of elevated BP on BP check today. Intermittent gait disturbance. Chest pressure vs indigestion.  Felt groggy Saturday and BP 150, unsteady on feet.  Fell 3 weeks ago when she was unsteady. Review of Systems  Positive: As above Negative: Weakness, unilateral numbness  Physical Exam  BP (!) 216/81 (BP Location: Right Arm)   Pulse 73   Temp 98.9 F (37.2 C) (Oral)   Resp 18   SpO2 98%  Gen:   Awake, no distress   Resp:  Normal effort  MSK:   Moves extremities without difficulty  Other:    Medical Decision Making  Medically screening exam initiated at 5:51 PM.  Appropriate orders placed.  Laura Garner was informed that the remainder of the evaluation will be completed by another provider, this initial triage assessment does not replace that evaluation, and the importance of remaining in the ED until their evaluation is complete.     Tacy Learn, PA-C 08/29/22 1750    Tacy Learn, PA-C 08/29/22 1751

## 2022-08-30 ENCOUNTER — Telehealth: Payer: Self-pay | Admitting: Internal Medicine

## 2022-08-30 NOTE — Telephone Encounter (Signed)
Left voicemail message to call back for review of recommendations.  

## 2022-08-30 NOTE — Telephone Encounter (Signed)
To Dr. Saunders Revel to review ER notes.  Please advise if ok to refill chlorthalidone at 25 mg once daily or provide any additional recommendations.  Follow up currently scheduled for 11/17/22 in the office.

## 2022-08-30 NOTE — Telephone Encounter (Signed)
Patient called to report she had a sudden spike in her BP last night and went to the ED.  Patient stated they increased her chlorthalidone (HYGROTON) 25 MG tablet medication from 1/2 tablet to a full tablet.  Patient stated they did CT scan, blood work and could find no other problems.  Patient stated she will have enough of this medication to last until her visit on 11/17/22.

## 2022-08-30 NOTE — Telephone Encounter (Signed)
Pt returning nurses call regarding mediation. Please advise

## 2022-08-30 NOTE — Telephone Encounter (Signed)
It is fine to refill her chlorthalidone 25 mg daily prescription.  Please try to schedule her to see me or an APP in about a month so that we can recheck her blood pressure and also draw a BMP at that time.  Thanks.  Gerald Stabs

## 2022-08-31 LAB — URINE CULTURE: Culture: 30000 — AB

## 2022-08-31 MED ORDER — CHLORTHALIDONE 25 MG PO TABS: 25.0000 mg | ORAL_TABLET | Freq: Every day | ORAL | 11 refills | Status: DC

## 2022-08-31 NOTE — Telephone Encounter (Signed)
Spoke with patient and reviewed provider information. Prescription sent in and appointment has been scheduled. She was appreciative for the call back with no further questions at this time.

## 2022-09-05 ENCOUNTER — Telehealth: Payer: Self-pay | Admitting: *Deleted

## 2022-09-05 NOTE — Telephone Encounter (Signed)
     Patient  visit on 08/29/2022  at [facility]  was for High bp  Have you been able to follow up with your primary care physician? Feeling much better and checking bp and has  PCP appt on friday The patient was or was not able to obtain any needed medicine or equipment.  Are there diet recommendations that you are having difficulty following?  Patient expresses understanding of discharge instructions and education provided has no other needs at this time.   [place] [phone] [address] , [place] [address] Email : [email or web address]

## 2022-09-06 DIAGNOSIS — E782 Mixed hyperlipidemia: Secondary | ICD-10-CM | POA: Diagnosis not present

## 2022-09-06 DIAGNOSIS — E1149 Type 2 diabetes mellitus with other diabetic neurological complication: Secondary | ICD-10-CM | POA: Diagnosis not present

## 2022-09-06 DIAGNOSIS — N189 Chronic kidney disease, unspecified: Secondary | ICD-10-CM | POA: Diagnosis not present

## 2022-09-08 DIAGNOSIS — N1831 Chronic kidney disease, stage 3a: Secondary | ICD-10-CM | POA: Diagnosis not present

## 2022-09-08 DIAGNOSIS — E782 Mixed hyperlipidemia: Secondary | ICD-10-CM | POA: Diagnosis not present

## 2022-09-08 DIAGNOSIS — K219 Gastro-esophageal reflux disease without esophagitis: Secondary | ICD-10-CM | POA: Diagnosis not present

## 2022-09-08 DIAGNOSIS — E1149 Type 2 diabetes mellitus with other diabetic neurological complication: Secondary | ICD-10-CM | POA: Diagnosis not present

## 2022-09-08 DIAGNOSIS — I10 Essential (primary) hypertension: Secondary | ICD-10-CM | POA: Diagnosis not present

## 2022-09-08 DIAGNOSIS — R2689 Other abnormalities of gait and mobility: Secondary | ICD-10-CM | POA: Diagnosis not present

## 2022-09-08 DIAGNOSIS — F320 Major depressive disorder, single episode, mild: Secondary | ICD-10-CM | POA: Diagnosis not present

## 2022-09-08 DIAGNOSIS — R490 Dysphonia: Secondary | ICD-10-CM | POA: Diagnosis not present

## 2022-09-08 DIAGNOSIS — D631 Anemia in chronic kidney disease: Secondary | ICD-10-CM | POA: Diagnosis not present

## 2022-09-08 DIAGNOSIS — N189 Chronic kidney disease, unspecified: Secondary | ICD-10-CM | POA: Diagnosis not present

## 2022-09-08 DIAGNOSIS — Z23 Encounter for immunization: Secondary | ICD-10-CM | POA: Diagnosis not present

## 2022-09-18 ENCOUNTER — Ambulatory Visit (INDEPENDENT_AMBULATORY_CARE_PROVIDER_SITE_OTHER): Payer: Medicare Other | Admitting: Podiatry

## 2022-09-18 DIAGNOSIS — E1149 Type 2 diabetes mellitus with other diabetic neurological complication: Secondary | ICD-10-CM

## 2022-09-18 DIAGNOSIS — Q828 Other specified congenital malformations of skin: Secondary | ICD-10-CM | POA: Diagnosis not present

## 2022-09-18 DIAGNOSIS — M79674 Pain in right toe(s): Secondary | ICD-10-CM

## 2022-09-18 DIAGNOSIS — M79675 Pain in left toe(s): Secondary | ICD-10-CM | POA: Diagnosis not present

## 2022-09-18 DIAGNOSIS — B351 Tinea unguium: Secondary | ICD-10-CM

## 2022-09-18 NOTE — Progress Notes (Signed)
Subjective: 81 y.o. returns the office today for painful, elongated, thickened toenails which she cannot trim herself as well as for calluses to both of her feet.  States she gets some pain on the inside aspect of her foot.  She also felt that She is getting some pain on the bottom of her left heel and she felt it was a stone bruise.  No injuries that she reports.  She has had a few falls, related to the blood pressure.  She feels that her feet are short and around.  PCP: Isaias Sakai, DO  Objective: AAO 3, NAD-wearing orthotics DP/PT pulses palpable, CRT less than 3 seconds Nails hypertrophic, dystrophic, elongated, brittle, discolored 10. There is tenderness overlying the nails 1-5 bilaterally. There is no surrounding erythema or drainage along the nail sites. Hyperkeratotic lesions bilateral medial arch on the area of the talar prominence.  No ongoing ulceration, drainage or any signs of infection.  The areas are preulcerative. Significant flatfoot is present.  No significant tenderness in the sinus tarsi today.  Subjectively she was given some discomfort in the plantar aspect of calcaneus on the left foot.  There is atrophy of the fat pad no area pinpoint tenderness. No open lesions and no erythema bilaterally. No pain with calf compression, swelling, warmth, erythema.  Assessment: Patient presents with symptomatic onychomycosis, hyperkeratotic lesions; heel pain  Plan: -Treatment options including alternatives, risks, complications were discussed -Nails sharply debrided 10 without complication/bleeding. -Hyperkeratotic lesion sharply debrided x 2 without any complications or bleeding.  Continue moisturizer, offloading. -We discussed possibly more balance braces if needed to help with stability/falls.  She could also have some component of neuropathy which is causing some balance issues.  Trula Slade DPM

## 2022-09-18 NOTE — Patient Instructions (Signed)
The braces I was talking about is called a '[place]"

## 2022-09-19 DIAGNOSIS — R2681 Unsteadiness on feet: Secondary | ICD-10-CM | POA: Diagnosis not present

## 2022-09-27 DIAGNOSIS — R2681 Unsteadiness on feet: Secondary | ICD-10-CM | POA: Diagnosis not present

## 2022-09-27 DIAGNOSIS — R42 Dizziness and giddiness: Secondary | ICD-10-CM | POA: Diagnosis not present

## 2022-10-03 ENCOUNTER — Other Ambulatory Visit
Admission: RE | Admit: 2022-10-03 | Discharge: 2022-10-03 | Disposition: A | Discharge status: Home / Self Care | Payer: Medicare Other | Attending: Nurse Practitioner | Admitting: Nurse Practitioner

## 2022-10-03 ENCOUNTER — Ambulatory Visit: Payer: Medicare Other | Attending: Nurse Practitioner | Admitting: Nurse Practitioner

## 2022-10-03 ENCOUNTER — Encounter: Payer: Self-pay | Admitting: Nurse Practitioner

## 2022-10-03 VITALS — BP 196/82 | HR 62 | Ht 61.5 in | Wt 185.0 lb

## 2022-10-03 DIAGNOSIS — N182 Chronic kidney disease, stage 2 (mild): Secondary | ICD-10-CM | POA: Insufficient documentation

## 2022-10-03 DIAGNOSIS — E782 Mixed hyperlipidemia: Secondary | ICD-10-CM | POA: Diagnosis not present

## 2022-10-03 DIAGNOSIS — I10 Essential (primary) hypertension: Secondary | ICD-10-CM | POA: Insufficient documentation

## 2022-10-03 DIAGNOSIS — R002 Palpitations: Secondary | ICD-10-CM | POA: Insufficient documentation

## 2022-10-03 LAB — BASIC METABOLIC PANEL
Anion gap: 10 (ref 5–15)
BUN: 28 mg/dL — ABNORMAL HIGH (ref 8–23)
CO2: 23 mmol/L (ref 22–32)
Calcium: 9.7 mg/dL (ref 8.9–10.3)
Chloride: 106 mmol/L (ref 98–111)
Creatinine, Ser: 1.13 mg/dL — ABNORMAL HIGH (ref 0.44–1.00)
GFR, Estimated: 49 mL/min — ABNORMAL LOW (ref >60)
Glucose, Bld: 194 mg/dL — ABNORMAL HIGH (ref 70–99)
Potassium: 4.2 mmol/L (ref 3.5–5.1)
Sodium: 139 mmol/L (ref 135–145)

## 2022-10-03 MED ORDER — CLONIDINE 0.2 MG/24HR TD PTWK: 0.2000 mg | MEDICATED_PATCH | TRANSDERMAL | 12 refills | Status: DC

## 2022-10-03 MED ORDER — CHLORTHALIDONE 25 MG PO TABS: 25.0000 mg | ORAL_TABLET | Freq: Every day | ORAL | 3 refills | Status: DC

## 2022-10-03 NOTE — Progress Notes (Signed)
Office Visit    Patient Name: Laura Garner Date of Encounter: 10/03/2022  Primary Care Provider:  Leonides Sake, MD Primary Cardiologist:  Nelva Bush, MD  Chief Complaint    81 y/o ? w/ a h/o difficult to control hypertension, hyperlipidemia, type 2 diabetes mellitus, stage II chronic kidney disease, obesity, and GERD, presents for follow-up related to hypertension.  Past Medical History    Past Medical History:  Diagnosis Date   Anemia    Anxiety    no meds   Arthritis    neck - otc med prn   Chest pain    a. 10/2017 MV: EF 55-65%, no ischemia, infarct.   Diabetes mellitus    type 2   Diastolic dysfunction    a. 10/2017 Echo: EF 55-60%, no rwma, Gr1 DD, mod dil LA, PASP 46mHg.   GERD (gastroesophageal reflux disease)    Heart palpitations    hx - caused by medication   History of kidney stones    multiple kidney stones -passed some stones and surgery x 3    Hyperlipidemia    Hypertension    a. Difficult to control-->10/2017 Renal Duplex: < 60% bilat RAS.   Left breast mass    PAH (pulmonary artery hypertension) ([facility]na    a. 10/2017 Echo: PASP 453mg.   RBBB    Reflux    Seasonal allergies    Sleep apnea    wears CPAP nightly   SVD (spontaneous vaginal delivery)    x 3   Past Surgical History:  Procedure Laterality Date   BREAST LUMPECTOMY WITH RADIOACTIVE SEED LOCALIZATION Left 07/19/2018   Procedure: LEFT BREAST LUMPECTOMY WITH RADIOACTIVE SEED LOCALIZATION ERAS PATHWAY;  Surgeon: ToJovita KussmaulMD;  [place] [facility] Service: General;  Laterality: Left;   HYSTEROSCOPY WITH D & C N/A 03/22/2017   Procedure: DILATATION AND CURETTAGE /HYSTEROSCOPY;  Surgeon: ThNewton PiggMD;  [facility] [facility];  Service: Gynecology;  Laterality: N/A;   lithrotripsy     surgery x 3   right foot surgery     repair tendon   SHOULDER SURGERY     x 2 - right and left    Allergies  Allergies  Allergen Reactions   Iodinated Contrast Media  Anaphylaxis   Amlodipine     Swelling/'foggy sensation'   Avelox [Moxifloxacin Hcl]     Weakness and felt bad   Doxazosin     Explosive diarrhea    Hydralazine     anxiety   Iodine    Trimethoprim Other (See Comments) and Diarrhea    Extreme diarrhea     History of Present Illness    [age]ear old female with above complex past medical history including difficult to control hypertension, hyperlipidemia, diabetes, stage II chronic kidney disease, obesity, and GERD.  She has had multiple medication intolerances in the setting of trying to get her blood pressure down including amlodipine, hydralazine, HCTZ, doxazosin, and lisinopril.  In November 2018, she underwent work-up for secondary hypertension was found to have a normal serum aldosterone/plasma renin activity, no significant renal artery stenosis on renal duplex, normal LV function on echo.  Stress testing was also undertaken was nonischemic.  She was placed on chlorthalidone in December 2018 with subsequent improvement in blood pressure.  [name] last seen in cardiology clinic in November 2022 at which time blood pressure was elevated though she attributed this to whitecoat.  Pressures at home were in the 10818Ho 12631Systolic.  She was advised that she could try to come off the clonidine patch however, over the past 10 months, pressures have been higher and she never was able to get off of the clonidine patch.  She was seen in the ER in September related to a fall with cellulitis over her left knee.  Blood pressure was over 200 at that time.  She has been tracking her blood pressures at home and typically runs in the 140s to 160s.  She denies chest pain, dyspnea, palpitations, PND, orthopnea, dizziness, syncope, edema, or early satiety.  Home Medications    Current Outpatient Medications  Medication Sig Dispense Refill   carvedilol (COREG) 12.5 MG tablet Take 12.5 mg by mouth 2 (two) times daily with a meal.     famotidine (PEPCID)  20 MG tablet Take 20 mg by mouth daily.     loratadine (CLARITIN) 10 MG tablet Take 10 mg by mouth daily.     losartan (COZAAR) 100 MG tablet Take 100 mg by mouth at bedtime.      Multiple Vitamin (MULTIVITAMIN) capsule Take 1 capsule by mouth daily.      NON FORMULARY Equalactin 2 tabs PO daily     Polyethyl Glycol-Propyl Glycol (SYSTANE ULTRA OP) Place 1 drop into both eyes daily as needed (dry eyes).     pravastatin (PRAVACHOL) 20 MG tablet Take 20 mg by mouth daily.     chlorthalidone (HYGROTON) 25 MG tablet Take 1 tablet (25 mg total) by mouth daily. 90 tablet 3   cloNIDine (CATAPRES - DOSED IN MG/24 HR) 0.2 mg/24hr patch Place 1 patch (0.2 mg total) onto the skin once a week. 4 patch 12   No current facility-administered medications for this visit.     Review of Systems    Elevated blood pressures but otherwise doing well.  She denies chest pain, palpitations, dyspnea, pnd, orthopnea, n, v, dizziness, syncope, edema, weight gain, or early satiety.  All other systems reviewed and are otherwise negative except as noted above.    Physical Exam    VS:  BP (!) 168/66 (BP Location: Left Arm, Patient Position: Sitting, Cuff Size: Normal)   Pulse 62   Ht 5' 1.5" (1.562 m)   Wt 185 lb (83.9 kg)   SpO2 98%   BMI 34.39 kg/m  , BMI Body mass index is 34.39 kg/m.     Vitals:   10/03/22 0857 10/03/22 1255  BP: (!) 168/66 (!) 196/82  Pulse: 62   SpO2: 98%     GEN: Well nourished, well developed, in no acute distress. HEENT: normal. Neck: Supple, no JVD, carotid bruits, or masses. Cardiac: RRR, 1/6 systolic murmur at the left lower sternal border, no rubs or gallops. No clubbing, cyanosis, edema.  Radials/PT 2+ and equal bilaterally.  Respiratory:  Respirations regular and unlabored, clear to auscultation bilaterally. GI: Soft, nontender, nondistended, BS + x 4. MS: no deformity or atrophy. Skin: warm and dry, no rash. Neuro:  Strength and sensation are intact. Psych: Normal  affect.  Accessory Clinical Findings    ECG personally reviewed by me today -regular sinus rhythm, 62, right bundle branch block- no acute changes.  Lab Results  Component Value Date   WBC 8.6 08/29/2022   HGB 10.8 (L) 08/29/2022   HCT 31.3 (L) 08/29/2022   MCV 96.6 08/29/2022   PLT 210 08/29/2022   Lab Results  Component Value Date   CREATININE 1.13 (H) 10/03/2022   BUN 28 (H) 10/03/2022   NA 139 10/03/2022  K 4.2 10/03/2022   CL 106 10/03/2022   CO2 23 10/03/2022   Lab Results  Component Value Date   ALT 13 08/29/2022   AST 20 08/29/2022   ALKPHOS 79 08/29/2022   BILITOT 1.4 (H) 08/29/2022    Assessment & Plan    1.  Essential hypertension/whitecoat hypertension: Pressures been trending up over the past year, now typically ranging in the 140s to 160s at home.  She is higher in the office today at 168/66 and 196/82 on repeat.  She has multiple intolerances but has been tolerating carvedilol, chlorthalidone, and clonidine.  Follow-up basic metabolic panel today shows slight abnormality BUN/creatinine at 28 and 1.13.  We will continue chlorthalidone but encourage adequate hydration.  I am increasing her clonidine patch to 0.2 mg weekly.  Continue current dose of carvedilol.  Patient will continue to follow blood pressures closely at home and contact us with numbers in approximately 2 weeks.  2.  Hyperlipidemia: On pravastatin therapy.  Lipids have been followed by her primary care provider.  3.  Stage II chronic kidney disease: Creatinine 1.13.  She is on chlorthalidone therapy.  Encourage adequate hydration.  4.  Type 2 diabetes mellitus: This has been diet controlled and followed closely by primary care.  5.  Disposition: Patient to contact us in approximately 2 weeks with blood pressure recordings.  We will tentatively plan for follow-up in 6 months.   Murray Hodgkins, NP 10/03/2022, 12:55 PM

## 2022-10-03 NOTE — Patient Instructions (Signed)
Medication Instructions:  Your physician has recommended you make the following change in your medication:   INCREASE Clonidine patch to 0.2 mg weekly  *If you need a refill on your cardiac medications before your next appointment, please call your pharmacy*   Lab Work: BMET today over at the [facility] and stop at registration desk to get checked in.  If you have labs (blood work) drawn today and your tests are completely normal, you will receive your results only by: [place] (if you have MyChart) OR A paper copy in the mail If you have any lab test that is abnormal or we need to change your treatment, we will call you to review the results.   Testing/Procedures: None   Follow-Up: At [facility], you and your health needs are our priority.  As part of our continuing mission to provide you with exceptional heart care, we have created designated Provider Care Teams.  These Care Teams include your primary Cardiologist (physician) and Advanced Practice Providers (APPs -  Physician Assistants and Nurse Practitioners) who all work together to provide you with the care you need, when you need it.  We recommend signing up for the patient portal called "MyChart".  Sign up information is provided on this After Visit Summary.  MyChart is used to connect with patients for Virtual Visits (Telemedicine).  Patients are able to view lab/test results, encounter notes, upcoming appointments, etc.  Non-urgent messages can be sent to your provider as well.   To learn more about what you can do with MyChart, go to [email or web address].    Your next appointment:   6 month(s)  The format for your next appointment:   In Person  Provider:   Nelva Bush, MD or Murray Hodgkins, NP        Important Information About Sugar

## 2022-10-05 DIAGNOSIS — R2681 Unsteadiness on feet: Secondary | ICD-10-CM | POA: Diagnosis not present

## 2022-10-05 DIAGNOSIS — R42 Dizziness and giddiness: Secondary | ICD-10-CM | POA: Diagnosis not present

## 2022-10-09 ENCOUNTER — Telehealth: Payer: Self-pay | Admitting: Nurse Practitioner

## 2022-10-09 DIAGNOSIS — R2681 Unsteadiness on feet: Secondary | ICD-10-CM | POA: Diagnosis not present

## 2022-10-09 DIAGNOSIS — R42 Dizziness and giddiness: Secondary | ICD-10-CM | POA: Diagnosis not present

## 2022-10-09 NOTE — Telephone Encounter (Signed)
  Pt c/o medication issue:  1. Name of Medication: cloNIDine (CATAPRES - DOSED IN MG/24 HR) 0.2 mg/24hr patch  2. How are you currently taking this medication (dosage and times per day)? Place 1 patch (0.2 mg total) onto the skin once a week.  3. Are you having a reaction (difficulty breathing--STAT)? No   4. What is your medication issue? Pt said, when [employer] increased her clonidine patch, she cannot tolerate it, she felt faster HR, numbness feelings in her hands and legs, lightheadedness and headache. Yesterday she changed her patch to her old dosage and she starting to feel better

## 2022-10-09 NOTE — Telephone Encounter (Signed)
Pt called reporting she was unable to tolerate increased dose of clonidine (0.2 mg weekly). She reported she developed headache, lightheadedness, numbness in both hands and legs, and occasionally felt like heart was racing with exertion. She also reported no significant change with BP.  Pt report yesterday she switched back to 0.1 mg and symptoms seems to be improving. BP this morning 145/70 and currently 131/64  Will forward to NP for recommendations

## 2022-10-10 MED ORDER — CLONIDINE HCL 0.1 MG PO TABS: 0.1000 mg | ORAL_TABLET | Freq: Two times a day (BID) | ORAL | 11 refills | Status: DC

## 2022-10-10 NOTE — Telephone Encounter (Signed)
Reviewed results and recommendations with patient. She was agreeable with plan and had no further questions at this time.

## 2022-10-10 NOTE — Telephone Encounter (Signed)
Thank you for follow-up.  I'm sorry that she felt poorly on the higher dose of clonidine patch.  As BP looks better on the lower dose, I recommend that she continue the 0.'1mg'$  patch and follow blood pressures daily.  She should contact us if blood pressures are trending > 209 systolic.

## 2022-10-12 DIAGNOSIS — R2681 Unsteadiness on feet: Secondary | ICD-10-CM | POA: Diagnosis not present

## 2022-10-12 DIAGNOSIS — R42 Dizziness and giddiness: Secondary | ICD-10-CM | POA: Diagnosis not present

## 2022-10-19 DIAGNOSIS — R42 Dizziness and giddiness: Secondary | ICD-10-CM | POA: Diagnosis not present

## 2022-10-19 DIAGNOSIS — R2681 Unsteadiness on feet: Secondary | ICD-10-CM | POA: Diagnosis not present

## 2022-10-24 DIAGNOSIS — R2681 Unsteadiness on feet: Secondary | ICD-10-CM | POA: Diagnosis not present

## 2022-10-24 DIAGNOSIS — R42 Dizziness and giddiness: Secondary | ICD-10-CM | POA: Diagnosis not present

## 2022-11-01 DIAGNOSIS — R42 Dizziness and giddiness: Secondary | ICD-10-CM | POA: Diagnosis not present

## 2022-11-01 DIAGNOSIS — R2681 Unsteadiness on feet: Secondary | ICD-10-CM | POA: Diagnosis not present

## 2022-11-08 DIAGNOSIS — R2681 Unsteadiness on feet: Secondary | ICD-10-CM | POA: Diagnosis not present

## 2022-11-08 DIAGNOSIS — R42 Dizziness and giddiness: Secondary | ICD-10-CM | POA: Diagnosis not present

## 2022-11-13 ENCOUNTER — Ambulatory Visit (INDEPENDENT_AMBULATORY_CARE_PROVIDER_SITE_OTHER): Payer: Medicare Other | Admitting: Primary Care

## 2022-11-13 ENCOUNTER — Encounter: Payer: Self-pay | Admitting: Primary Care

## 2022-11-13 VITALS — BP 164/80 | HR 61 | Temp 97.7°F | Ht 61.5 in | Wt 186.8 lb

## 2022-11-13 DIAGNOSIS — K219 Gastro-esophageal reflux disease without esophagitis: Secondary | ICD-10-CM | POA: Insufficient documentation

## 2022-11-13 DIAGNOSIS — R490 Dysphonia: Secondary | ICD-10-CM | POA: Diagnosis not present

## 2022-11-13 DIAGNOSIS — G4733 Obstructive sleep apnea (adult) (pediatric): Secondary | ICD-10-CM | POA: Diagnosis not present

## 2022-11-13 DIAGNOSIS — J302 Other seasonal allergic rhinitis: Secondary | ICD-10-CM | POA: Diagnosis not present

## 2022-11-13 MED ORDER — FAMOTIDINE 20 MG PO TABS: 20.0000 mg | ORAL_TABLET | Freq: Two times a day (BID) | ORAL | 2 refills | Status: AC

## 2022-11-13 NOTE — Assessment & Plan Note (Signed)
-   Improved on H2 blocker but still having breakthrough reflux along with voice hoarseness - Increase famotidine '20mg'$  twice daily

## 2022-11-13 NOTE — Patient Instructions (Addendum)
Sleep apnea is well-controlled on current pressure settings No changes today  Recommendations: Increased Famotidine '20mg'$  twice daily  Follow GERD diet Start flonase nasal daily as needed for rhinitis symptoms  Continue Claritin  Referring to ENT d/t voice hoarseness Continue to wear CPAP every night for minimum 4 to 6 hours or longer  Follow-up: 1 year with Dr. Mortimer Fries or sooner if needed   Food Choices for Gastroesophageal Reflux Disease, Adult When you have gastroesophageal reflux disease (GERD), the foods you eat and your eating habits are very important. Choosing the right foods can help ease the discomfort of GERD. Consider working with a dietitian to help you make healthy food choices. What are tips for following this plan? Reading food labels Look for foods that are low in saturated fat. Foods that have less than 5% of daily value (DV) of fat and 0 g of trans fats may help with your symptoms. Cooking Cook foods using methods other than frying. This may include baking, steaming, grilling, or broiling. These are all methods that do not need a lot of fat for cooking. To add flavor, try to use herbs that are low in spice and acidity. Meal planning  Choose healthy foods that are low in fat, such as fruits, vegetables, whole grains, low-fat dairy products, lean meats, fish, and poultry. Eat frequent, small meals instead of three large meals each day. Eat your meals slowly, in a relaxed setting. Avoid bending over or lying down until 2-3 hours after eating. Limit high-fat foods such as fatty meats or fried foods. Limit your intake of fatty foods, such as oils, butter, and shortening. Avoid the following as told by your health care provider: Foods that cause symptoms. These may be different for different people. Keep a food diary to keep track of foods that cause symptoms. Alcohol. Drinking large amounts of liquid with meals. Eating meals during the 2-3 hours before  bed. Lifestyle Maintain a healthy weight. Ask your health care provider what weight is healthy for you. If you need to lose weight, work with your health care provider to do so safely. Exercise for at least 30 minutes on 5 or more days each week, or as told by your health care provider. Avoid wearing clothes that fit tightly around your waist and chest. Do not use any products that contain nicotine or tobacco. These products include cigarettes, chewing tobacco, and vaping devices, such as e-cigarettes. If you need help quitting, ask your health care provider. Sleep with the head of your bed raised. Use a wedge under the mattress or blocks under the bed frame to raise the head of the bed. Chew sugar-free gum after mealtimes. What foods should I eat?  Eat a healthy, well-balanced diet of fruits, vegetables, whole grains, low-fat dairy products, lean meats, fish, and poultry. Each person is different. Foods that may trigger symptoms in one person may not trigger any symptoms in another person. Work with your health care provider to identify foods that are safe for you. The items listed above may not be a complete list of recommended foods and beverages. Contact a dietitian for more information. What foods should I avoid? Limiting some of these foods may help manage the symptoms of GERD. Everyone is different. Consult a dietitian or your health care provider to help you identify the exact foods to avoid, if any. Fruits Any fruits prepared with added fat. Any fruits that cause symptoms. For some people this may include citrus fruits, such as oranges, grapefruit, pineapple,  and lemons. Vegetables Deep-fried vegetables. [place] fries. Any vegetables prepared with added fat. Any vegetables that cause symptoms. For some people, this may include tomatoes and tomato products, chili peppers, onions and garlic, and horseradish. Grains Pastries or quick breads with added fat. Meats and other proteins High-fat  meats, such as fatty beef or pork, hot dogs, ribs, ham, sausage, salami, and bacon. Fried meat or protein, including fried fish and fried chicken. Nuts and nut butters, in large amounts. Dairy Whole milk and chocolate milk. Sour cream. Cream. Ice cream. Cream cheese. Milkshakes. Fats and oils Butter. Margarine. Shortening. Ghee. Beverages Coffee and tea, with or without caffeine. Carbonated beverages. Sodas. Energy drinks. Fruit juice made with acidic fruits, such as orange or grapefruit. Tomato juice. Alcoholic drinks. Sweets and desserts Chocolate and cocoa. Donuts. Seasonings and condiments Pepper. Peppermint and spearmint. Added salt. Any condiments, herbs, or seasonings that cause symptoms. For some people, this may include curry, hot sauce, or vinegar-based salad dressings. The items listed above may not be a complete list of foods and beverages to avoid. Contact a dietitian for more information. Questions to ask your health care provider Diet and lifestyle changes are usually the first steps that are taken to manage symptoms of GERD. If diet and lifestyle changes do not improve your symptoms, talk with your health care provider about taking medicines. Where to find more information International Foundation for Gastrointestinal Disorders: aboutgerd.org Summary When you have gastroesophageal reflux disease (GERD), food and lifestyle choices may be very helpful in easing the discomfort of GERD. Eat frequent, small meals instead of three large meals each day. Eat your meals slowly, in a relaxed setting. Avoid bending over or lying down until 2-3 hours after eating. Limit high-fat foods such as fatty meats or fried foods. This information is not intended to replace advice given to you by your health care provider. Make sure you discuss any questions you have with your health care provider. Document Revised: 06/14/2020 Document Reviewed: 06/14/2020 Elsevier Patient Education  [place].

## 2022-11-13 NOTE — Assessment & Plan Note (Addendum)
-   Well controlled on current pressure settings. Patient is 100% compliant with CPAP use > 4 hours over the last 30 days. Se is sleeping well at night. No issues with mask fit or pressure settings. Pressure 10-20cm h20; Residual AHI 5.7/hour. Airleaks and apneas have improved. No changes today. Continue to encourage weight loss efforts.  Advised against driving if experiencing excessive daytime sleepiness fatigue.  Renew CPAP supplies with her DME company/ [employer]. follow-up in 1 year with provider or sooner if needed.

## 2022-11-13 NOTE — Assessment & Plan Note (Addendum)
-   New voice hoarseness x1 year, unsure if CPAP related. Likely d/t PND and GERD. I will refer to ENT for upper airway exam.

## 2022-11-13 NOTE — Progress Notes (Signed)
$'@Patient'I$  ID: Laura Garner, female    DOB: [birth year], 81 y.o.   MRN: [id number]  Chief Complaint  Patient presents with   Follow-up    Wearing cpap avg 6-7hr nightly-pressure and mask is okay.     Referring provider: Leonides Sake, MD  HPI: 81 year old female, never smoked. PMH significant for OSA, allergic rhinitis, HTN, CKD. Patient of Dr. Mortimer Fries.   11/13/2022 Patient presents today for 1 year follow-up/ OSA. Accompanied by her daughter. She is doing well today. BP is slightly elevated but daughter states she has white coat syndrome is this reading is actually good for her. She did take her blood pressure medication this morning. She sleeps well most nights. She typically has no issues falling or staying asleep. On very rare occasion she may wake up and be unable to fall back to sleep. Maintained on auto CPAP 10-20cm h20. No significant issues with airleaks. Energy level is good   She reports new voice hoarseness over the last year. She feels it started after using CPAP. She does have underlying reflux and PND symptoms. Heartburn is less noticeable since being on famotidine '20mg'$  at bedtime but hoarseness is no better . She takes allergy medication for post nasal drip symptoms   Airview download 10/11/22-11/09/22 Usage 30/30 days; 100% > 4 hours Average usage 6 hours 57 mins Pressure 10-20cm h20  Airleaks 16.8L/min (95%) AHI 5.7    TEST/EVENTS :  PSG 01/02/18: mod-severe OSA. RDI 37.4. Autoset 5-20 recommended Compliance 02/25-03/26/19: 30/30 nights.  Compliance 09/03-10/02/19: 30/30 nights. Mean usage 7.2 hrs. Median pressure 19.7 mmHg. AHI 19.4 Compliance 04/18-05/17/20: 30/30 nights. Mean usage: 7.0 hrs. Median pressure 9.3 cm H2O. AHI 10/hr Compliance 10/11/22-11/09/22: 30/30 nights. Mean usage 7 hours. Median pressure 12.9c, h20, AHI 5.7/hour    Allergies  Allergen Reactions   Iodinated Contrast Media Anaphylaxis   Amlodipine     Swelling/'foggy sensation'   Avelox  [Moxifloxacin Hcl]     Weakness and felt bad   Doxazosin     Explosive diarrhea    Hydralazine     anxiety   Iodine    Trimethoprim Other (See Comments) and Diarrhea    Extreme diarrhea     Immunization History  Administered Date(s) Administered   Fluad Quad(high Dose 65+) 09/15/2019   Influenza, High Dose Seasonal PF 09/04/2018   Influenza-Unspecified 08/28/2022   PFIZER(Purple Top)SARS-COV-2 Vaccination 08/06/2020, 08/30/2020   Zoster Recombinat (Shingrix) 09/09/2018, 11/19/2018    Past Medical History:  Diagnosis Date   Anemia    Anxiety    no meds   Arthritis    neck - otc med prn   Chest pain    a. 10/2017 MV: EF 55-65%, no ischemia, infarct.   Diabetes mellitus    type 2   Diastolic dysfunction    a. 10/2017 Echo: EF 55-60%, no rwma, Gr1 DD, mod dil LA, PASP 28mHg.   GERD (gastroesophageal reflux disease)    Heart palpitations    hx - caused by medication   History of kidney stones    multiple kidney stones -passed some stones and surgery x 3    Hyperlipidemia    Hypertension    a. Difficult to control-->10/2017 Renal Duplex: < 60% bilat RAS.   Left breast mass    [place]    a. 10/2017 Echo: PASP 464mg.   RBBB    Reflux    Seasonal allergies    Sleep apnea    wears CPAP nightly  SVD (spontaneous vaginal delivery)    x 3    Tobacco History: Social History   Tobacco Use  Smoking Status Never  Smokeless Tobacco Never   Counseling given: Not Answered   Outpatient Medications Prior to Visit  Medication Sig Dispense Refill   carvedilol (COREG) 12.5 MG tablet Take 12.5 mg by mouth 2 (two) times daily with a meal.     chlorthalidone (HYGROTON) 25 MG tablet Take 1 tablet (25 mg total) by mouth daily. 90 tablet 3   cloNIDine (CATAPRES - DOSED IN MG/24 HR) 0.2 mg/24hr patch Place 0.2 mg onto the skin once a week.     loratadine (CLARITIN) 10 MG tablet Take 10 mg by mouth daily.     losartan (COZAAR) 100 MG tablet  Take 100 mg by mouth at bedtime.      Multiple Vitamin (MULTIVITAMIN) capsule Take 1 capsule by mouth daily.      NON FORMULARY Equalactin 2 tabs PO daily     Polyethyl Glycol-Propyl Glycol (SYSTANE ULTRA OP) Place 1 drop into both eyes daily as needed (dry eyes).     pravastatin (PRAVACHOL) 20 MG tablet Take 20 mg by mouth daily.     cloNIDine (CATAPRES) 0.1 MG tablet Take 1 tablet (0.1 mg total) by mouth 2 (two) times daily. 60 tablet 11   famotidine (PEPCID) 20 MG tablet Take 20 mg by mouth daily.     No facility-administered medications prior to visit.    Review of Systems  Review of Systems  Constitutional: Negative.  Negative for fatigue.  HENT:  Positive for voice change.   Respiratory: Negative.    Cardiovascular: Negative.   Psychiatric/Behavioral:  Negative for sleep disturbance.    Physical Exam  BP (!) 164/80 (BP Location: Left Arm, Cuff Size: Large)   Pulse 61   Temp 97.7 F (36.5 C) (Oral)   Ht 5' 1.5" (1.562 m)   Wt 186 lb 12.8 oz (84.7 kg)   SpO2 100%   BMI 34.72 kg/m  Physical Exam Constitutional:      Appearance: Normal appearance.  HENT:     Head: Normocephalic and atraumatic.     Mouth/Throat:     Mouth: Mucous membranes are moist.     Pharynx: Oropharynx is clear.  Cardiovascular:     Rate and Rhythm: Normal rate and regular rhythm.     Comments: Trace edema Pulmonary:     Effort: Pulmonary effort is normal.     Breath sounds: Normal breath sounds. No wheezing, rhonchi or rales.  Musculoskeletal:        General: Normal range of motion.     Cervical back: Normal range of motion and neck supple.  Skin:    General: Skin is warm and dry.  Neurological:     General: No focal deficit present.     Mental Status: She is alert and oriented to person, place, and time. Mental status is at baseline.  Psychiatric:        Mood and Affect: Mood normal.        Behavior: Behavior normal.        Thought Content: Thought content normal.        Judgment:  Judgment normal.      Lab Results:  CBC    Component Value Date/Time   WBC 8.6 08/29/2022 1751   RBC 3.24 (L) 08/29/2022 1751   HGB 10.8 (L) 08/29/2022 1751   HCT 31.3 (L) 08/29/2022 1751   PLT 210 08/29/2022 1751   MCV  96.6 08/29/2022 1751   MCH 33.3 08/29/2022 1751   MCHC 34.5 08/29/2022 1751   RDW 12.0 08/29/2022 1751   LYMPHSABS 1.8 08/29/2022 1751   MONOABS 0.7 08/29/2022 1751   EOSABS 0.1 08/29/2022 1751   BASOSABS 0.1 08/29/2022 1751    BMET    Component Value Date/Time   NA 139 10/03/2022 0956   NA 145 (H) 01/16/2018 0848   K 4.2 10/03/2022 0956   CL 106 10/03/2022 0956   CO2 23 10/03/2022 0956   GLUCOSE 194 (H) 10/03/2022 0956   BUN 28 (H) 10/03/2022 0956   BUN 24 01/16/2018 0848   CREATININE 1.13 (H) 10/03/2022 0956   CALCIUM 9.7 10/03/2022 0956   GFRNONAA 49 (L) 10/03/2022 0956   GFRAA >60 07/10/2018 0937    BNP No results found for: "BNP"  ProBNP No results found for: "PROBNP"  Imaging: No results found.   Assessment & Plan:   OSA (obstructive sleep apnea) - Well controlled on current pressure settings. Patient is 100% compliant with CPAP use > 4 hours over the last 30 days. Se is sleeping well at night. No issues with mask fit or pressure settings. Pressure 10-20cm h20; Residual AHI 5.7/hour. Airleaks and apneas have improved. No changes today. Continue to encourage weight loss efforts.  Advised against driving if experiencing excessive daytime sleepiness fatigue.  Renew CPAP supplies with her DME company/ [employer]. follow-up in 1 year with provider or sooner if needed.  Voice hoarseness - New voice hoarseness x1 year, unsure if CPAP related. Likely d/t PND and GERD. I will refer to ENT for upper airway exam.   Allergic rhinitis - Continue Claritin '10mg'$  daily - Add flonase nasal spray daily as needed for rhinitis   Acid reflux - Improved on H2 blocker but still having breakthrough reflux along with voice hoarseness - Increase famotidine '20mg'$   twice daily    Martyn Ehrich, NP 11/13/2022

## 2022-11-13 NOTE — Assessment & Plan Note (Signed)
-   Continue Claritin '10mg'$  daily - Add flonase nasal spray daily as needed for rhinitis

## 2022-11-17 ENCOUNTER — Ambulatory Visit: Payer: Medicare Other | Admitting: Internal Medicine

## 2022-11-17 DIAGNOSIS — R42 Dizziness and giddiness: Secondary | ICD-10-CM | POA: Diagnosis not present

## 2022-11-17 DIAGNOSIS — R2681 Unsteadiness on feet: Secondary | ICD-10-CM | POA: Diagnosis not present

## 2022-11-20 ENCOUNTER — Ambulatory Visit (INDEPENDENT_AMBULATORY_CARE_PROVIDER_SITE_OTHER): Payer: Medicare Other | Admitting: Podiatry

## 2022-11-20 DIAGNOSIS — M79675 Pain in left toe(s): Secondary | ICD-10-CM

## 2022-11-20 DIAGNOSIS — B351 Tinea unguium: Secondary | ICD-10-CM | POA: Diagnosis not present

## 2022-11-20 DIAGNOSIS — M79674 Pain in right toe(s): Secondary | ICD-10-CM

## 2022-11-20 DIAGNOSIS — E1149 Type 2 diabetes mellitus with other diabetic neurological complication: Secondary | ICD-10-CM | POA: Diagnosis not present

## 2022-11-20 DIAGNOSIS — Q828 Other specified congenital malformations of skin: Secondary | ICD-10-CM

## 2022-11-20 NOTE — Progress Notes (Unsigned)
Subjective: Chief Complaint  Patient presents with   Nail Problem    Routine foot care, nail and callus trim, patient couldn't put pressure or weight on the right foot 2 weeks ago, pain has left since,    81 year old female presents the office with above concerns.  Main concern is the callus as well as the nails are thickened elongated she has difficulty trimming them they are causing pain.  About 2 weeks ago she had pain to the right foot.  She states the pain started 1 morning she woke up by the end of the day she was limping but after that it quickly resolved and currently not have any pain.  No recent injuries.  She is doing physical therapy for gait instability which is helping.  PCP: Isaias Sakai, DO  Objective: AAO 3, NAD-wearing orthotics DP/PT pulses palpable, CRT less than 3 seconds Nails hypertrophic, dystrophic, elongated, brittle, discolored 10. There is tenderness overlying the nails 1-5 bilaterally. There is no surrounding erythema or drainage along the nail sites. Hyperkeratotic lesions bilateral medial arch on the area of the talar prominence.  No ongoing ulceration, drainage or any signs of infection.  The areas are preulcerative. Significant flatfoot is present.  Unable to appreciate any area pinpoint tenderness.  Fatigue on the right side where she is having pain no pain today. No open lesions and no erythema bilaterally. No pain with calf compression, swelling, warmth, erythema.  Assessment: Patient presents with symptomatic onychomycosis, hyperkeratotic lesions  Plan: -Treatment options including alternatives, risks, complications were discussed -Nails sharply debrided 10 without complication/bleeding. -Hyperkeratotic lesion sharply debrided x 2 without any complications or bleeding.  Continue moisturizer, offloading. -Continue physical therapy.  If the pain right-sided reoccurs to let me know.  Trula Slade DPM

## 2022-11-21 DIAGNOSIS — R42 Dizziness and giddiness: Secondary | ICD-10-CM | POA: Diagnosis not present

## 2022-11-21 DIAGNOSIS — R2681 Unsteadiness on feet: Secondary | ICD-10-CM | POA: Diagnosis not present

## 2022-12-08 DIAGNOSIS — E119 Type 2 diabetes mellitus without complications: Secondary | ICD-10-CM | POA: Diagnosis not present

## 2022-12-26 ENCOUNTER — Telehealth: Payer: Self-pay

## 2022-12-26 NOTE — Patient Outreach (Signed)
  Care Coordination   12/26/2022 Name: Laura Garner MRN: [id number] DOB: 12/06/1941   Care Coordination Outreach Attempts:  An unsuccessful telephone outreach was attempted today to offer the patient information about available care coordination services as a benefit of their health plan.   Follow Up Plan:  Additional outreach attempts will be made to offer the patient care coordination information and services.   Encounter Outcome:  No Answer   Care Coordination Interventions:  No, not indicated    Tomasa Rand, RN, BSN, [facility] [employer] [phone]

## 2022-12-28 ENCOUNTER — Telehealth: Payer: Self-pay

## 2022-12-28 NOTE — Patient Outreach (Signed)
  Care Coordination   Initial Visit Note   12/28/2022 Name: Laura Garner MRN: [id number] DOB: [birth year]  Laura Garner is a 82 y.o. year old female who sees Hamrick, Lorin Mercy, MD for primary care. I spoke with  Laura Garner by phone today.  What matters to the patients health and wellness today?  Placed call to patient to review and offer [facility] care coordination. Patient reports she is having trouble with dizziness.  Reports that she is planning to see an ENT.  Reports she has completed PT. Patient reports that she thinks that she is eating and drinking well.  Patient reports that she is uses a cane when she is out of the home.  Patient reports that she has follow up scheduled with PCP in 2 weeks.    Goals Addressed               This Visit's Progress     COMPLETED: I have dizziness (pt-stated)        Care Coordination Interventions: Advised patient to keep MD appointments Patient to change positions slowly. Reviewed importance of safety at home.  Encouraged patient to continue to self monitor BP.          SDOH assessments and interventions completed:  No     Care Coordination Interventions:  Yes, provided   Follow up plan: No further intervention required.   Encounter Outcome:  Pt. Visit Completed   Tomasa Rand, RN, BSN, [facility] [place] Coordinator [phone]

## 2023-01-08 DIAGNOSIS — E782 Mixed hyperlipidemia: Secondary | ICD-10-CM | POA: Diagnosis not present

## 2023-01-08 DIAGNOSIS — N189 Chronic kidney disease, unspecified: Secondary | ICD-10-CM | POA: Diagnosis not present

## 2023-01-08 DIAGNOSIS — E1149 Type 2 diabetes mellitus with other diabetic neurological complication: Secondary | ICD-10-CM | POA: Diagnosis not present

## 2023-01-10 DIAGNOSIS — G319 Degenerative disease of nervous system, unspecified: Secondary | ICD-10-CM | POA: Diagnosis not present

## 2023-01-10 DIAGNOSIS — I10 Essential (primary) hypertension: Secondary | ICD-10-CM | POA: Diagnosis not present

## 2023-01-10 DIAGNOSIS — N1831 Chronic kidney disease, stage 3a: Secondary | ICD-10-CM | POA: Diagnosis not present

## 2023-01-10 DIAGNOSIS — K219 Gastro-esophageal reflux disease without esophagitis: Secondary | ICD-10-CM | POA: Diagnosis not present

## 2023-01-10 DIAGNOSIS — D631 Anemia in chronic kidney disease: Secondary | ICD-10-CM | POA: Diagnosis not present

## 2023-01-10 DIAGNOSIS — N189 Chronic kidney disease, unspecified: Secondary | ICD-10-CM | POA: Diagnosis not present

## 2023-01-10 DIAGNOSIS — E1149 Type 2 diabetes mellitus with other diabetic neurological complication: Secondary | ICD-10-CM | POA: Diagnosis not present

## 2023-01-10 DIAGNOSIS — Z139 Encounter for screening, unspecified: Secondary | ICD-10-CM | POA: Diagnosis not present

## 2023-01-10 DIAGNOSIS — Z9181 History of falling: Secondary | ICD-10-CM | POA: Diagnosis not present

## 2023-01-10 DIAGNOSIS — E782 Mixed hyperlipidemia: Secondary | ICD-10-CM | POA: Diagnosis not present

## 2023-01-10 DIAGNOSIS — F330 Major depressive disorder, recurrent, mild: Secondary | ICD-10-CM | POA: Diagnosis not present

## 2023-01-15 DIAGNOSIS — R1314 Dysphagia, pharyngoesophageal phase: Secondary | ICD-10-CM | POA: Diagnosis not present

## 2023-01-15 DIAGNOSIS — R490 Dysphonia: Secondary | ICD-10-CM | POA: Diagnosis not present

## 2023-01-15 DIAGNOSIS — R42 Dizziness and giddiness: Secondary | ICD-10-CM | POA: Diagnosis not present

## 2023-01-15 DIAGNOSIS — H6121 Impacted cerumen, right ear: Secondary | ICD-10-CM | POA: Diagnosis not present

## 2023-01-22 ENCOUNTER — Ambulatory Visit (INDEPENDENT_AMBULATORY_CARE_PROVIDER_SITE_OTHER): Payer: Medicare Other | Admitting: Podiatry

## 2023-01-22 DIAGNOSIS — M79675 Pain in left toe(s): Secondary | ICD-10-CM | POA: Diagnosis not present

## 2023-01-22 DIAGNOSIS — E1149 Type 2 diabetes mellitus with other diabetic neurological complication: Secondary | ICD-10-CM

## 2023-01-22 DIAGNOSIS — R269 Unspecified abnormalities of gait and mobility: Secondary | ICD-10-CM

## 2023-01-22 DIAGNOSIS — Q828 Other specified congenital malformations of skin: Secondary | ICD-10-CM | POA: Diagnosis not present

## 2023-01-22 DIAGNOSIS — B351 Tinea unguium: Secondary | ICD-10-CM

## 2023-01-22 DIAGNOSIS — G629 Polyneuropathy, unspecified: Secondary | ICD-10-CM

## 2023-01-22 DIAGNOSIS — M79674 Pain in right toe(s): Secondary | ICD-10-CM | POA: Diagnosis not present

## 2023-01-24 NOTE — Progress Notes (Signed)
Chief Complaint  Patient presents with   Nail Problem    Thick painful toenails, 2 month follow up    82 year old female presents the office with above concerns.  Main concern is the callus as well as the nails are thickened elongated she has difficulty trimming them they are causing pain.  She has pain most on the callus areas.  She still been having issues with her balance.  She previously had seen somebody get her ears checked.  There was concern for possibly also neuropathy, nerve issues to her feet.  It was recommended possibly neurology consult but has not yet been placed.   PCP: Hamrick, Lorin Mercy, MD  Objective: AAO 3, NAD-wearing orthotics DP/PT pulses palpable, CRT less than 3 seconds Nails hypertrophic, dystrophic, elongated, brittle, discolored 10. There is tenderness overlying the nails 1-5 bilaterally. There is no surrounding erythema or drainage along the nail sites. Hyperkeratotic lesions bilateral medial arch on the area of the talar prominence.  No ongoing ulceration, drainage or any signs of infection.  The areas are preulcerative. Significant flatfoot is present.  Unable to appreciate any area pinpoint tenderness.  No significant pain on exam.  Clinically the feet do not appear to have significantly changed. No open lesions and no erythema bilaterally. No pain with calf compression, swelling, warmth, erythema.  Assessment: Patient presents with symptomatic onychomycosis, hyperkeratotic lesions  Plan: -Treatment options including alternatives, risks, complications were discussed -Nails sharply debrided 10 without complication/bleeding. -Hyperkeratotic lesion sharply debrided x 2 without any complications or bleeding.  Continue moisturizer, offloading. -Given her balance issues and concern for neuropathy will place neurology referral. -Continue physical therapy.  If the pain right-sided reoccurs to let me know.  Trula Slade DPM

## 2023-03-14 NOTE — Progress Notes (Unsigned)
[facility]  PATIENT: Laura Garner DOB: 15-Aug-1941  REFERRING DOCTOR OR PCP: Mayo Ao D.P.M.; Daiva Eves, MD SOURCE: Patient, notes from Dr. Earleen Newport imaging and lab reports, CT scan personally reviewed.  _________________________________   HISTORICAL  CHIEF COMPLAINT:  No chief complaint on file.   HISTORY OF PRESENT ILLNESS:  I had the pleasure of seeing your patient, Laura Garner, at [facility] for neurologic consultation regarding her unsteady gait and foot numbness .  She is an 82 year old woman   She has been diagnosed with diabetes and was once on metformin.  With 50 pound weight loss treatment of the A1c, she was able to get off of that medication..  OSA and uses auto CPAP 10 to 20 cm AHI was 5.7 on 11/09/2022 100% compliance.  Her baseline PSG 01/02/2018 showed RDI 37.4  Imaging: CT scan of the head 08/29/2022 showed age-appropriate mild generalized cortical atrophy.  There are some white matter changes in the hemispheres.  There is a possible focus within the pons versus artifact.  Laboratory: Glucoses have been elevated over the last 5 years.  REVIEW OF SYSTEMS: Constitutional: No fevers, chills, sweats, or change in appetite Eyes: No visual changes, double vision, eye pain Ear, nose and throat: No hearing loss, ear pain, nasal congestion, sore throat Cardiovascular: No chest pain, palpitations Respiratory:  No shortness of breath at rest or with exertion.   No wheezes GastrointestinaI: No nausea, vomiting, diarrhea, abdominal pain, fecal incontinence Genitourinary:  No dysuria, urinary retention or frequency.  No nocturia. Musculoskeletal:  No neck pain, back pain Integumentary: No rash, pruritus, skin lesions Neurological: as above Psychiatric: No depression at this time.  No anxiety Endocrine: No palpitations, diaphoresis, change in appetite, change in weigh or increased thirst Hematologic/Lymphatic:  No anemia,  purpura, petechiae. Allergic/Immunologic: No itchy/runny eyes, nasal congestion, recent allergic reactions, rashes  ALLERGIES: Allergies  Allergen Reactions   Iodinated Contrast Media Anaphylaxis   Amlodipine     Swelling/'foggy sensation'   Avelox [Moxifloxacin Hcl]     Weakness and felt bad   Doxazosin     Explosive diarrhea    Hydralazine     anxiety   Iodine    Trimethoprim Other (See Comments) and Diarrhea    Extreme diarrhea     HOME MEDICATIONS:  Current Outpatient Medications:    carvedilol (COREG) 12.5 MG tablet, Take 12.5 mg by mouth 2 (two) times daily with a meal., Disp: , Rfl:    chlorthalidone (HYGROTON) 25 MG tablet, Take 1 tablet (25 mg total) by mouth daily., Disp: 90 tablet, Rfl: 3   cloNIDine (CATAPRES - DOSED IN MG/24 HR) 0.2 mg/24hr patch, Place 0.2 mg onto the skin once a week., Disp: , Rfl:    famotidine (PEPCID) 20 MG tablet, Take 1 tablet (20 mg total) by mouth 2 (two) times daily., Disp: 60 tablet, Rfl: 2   loratadine (CLARITIN) 10 MG tablet, Take 10 mg by mouth daily., Disp: , Rfl:    losartan (COZAAR) 100 MG tablet, Take 100 mg by mouth at bedtime. , Disp: , Rfl:    Multiple Vitamin (MULTIVITAMIN) capsule, Take 1 capsule by mouth daily. , Disp: , Rfl:    NON FORMULARY, Equalactin 2 tabs PO daily, Disp: , Rfl:    Polyethyl Glycol-Propyl Glycol (SYSTANE ULTRA OP), Place 1 drop into both eyes daily as needed (dry eyes)., Disp: , Rfl:    pravastatin (PRAVACHOL) 20 MG tablet, Take 20 mg by mouth daily., Disp: , Rfl:  PAST MEDICAL HISTORY: Past Medical History:  Diagnosis Date   Anemia    Anxiety    no meds   Arthritis    neck - otc med prn   Chest pain    a. 10/2017 MV: EF 55-65%, no ischemia, infarct.   Diabetes mellitus    type 2   Diastolic dysfunction    a. 10/2017 Echo: EF 55-60%, no rwma, Gr1 DD, mod dil LA, PASP 37mmHg.   GERD (gastroesophageal reflux disease)    Heart palpitations    hx - caused by medication   History of kidney  stones    multiple kidney stones -passed some stones and surgery x 3    Hyperlipidemia    Hypertension    a. Difficult to control-->10/2017 Renal Duplex: < 60% bilat RAS.   Left breast mass    PAH (pulmonary artery hypertension) ([place])    a. 10/2017 Echo: PASP 60mmHg.   RBBB    Reflux    Seasonal allergies    Sleep apnea    wears CPAP nightly   SVD (spontaneous vaginal delivery)    x 3    PAST SURGICAL HISTORY: Past Surgical History:  Procedure Laterality Date   BREAST LUMPECTOMY WITH RADIOACTIVE SEED LOCALIZATION Left 07/19/2018   Procedure: LEFT BREAST LUMPECTOMY WITH RADIOACTIVE SEED LOCALIZATION ERAS PATHWAY;  Surgeon: Jovita Kussmaul, MD;  Location: [place];  Service: General;  Laterality: Left;   HYSTEROSCOPY WITH D & C N/A 03/22/2017   Procedure: DILATATION AND CURETTAGE /HYSTEROSCOPY;  Surgeon: Newton Pigg, MD;  Location: [facility];  Service: Gynecology;  Laterality: N/A;   lithrotripsy     surgery x 3   right foot surgery     repair tendon   SHOULDER SURGERY     x 2 - right and left    FAMILY HISTORY: Family History  Problem Relation Age of Onset   Heart failure Mother    Hypertension Father     SOCIAL HISTORY: Social History   Socioeconomic History   Marital status: Divorced    Spouse name: Not on file   Number of children: Not on file   Years of education: Not on file   Highest education level: Not on file  Occupational History   Not on file  Tobacco Use   Smoking status: Never   Smokeless tobacco: Never  Vaping Use   Vaping Use: Never used  Substance and Sexual Activity   Alcohol use: No    Alcohol/week: 0.0 standard drinks of alcohol   Drug use: No   Sexual activity: Not on file  Other Topics Concern   Not on file  Social History Narrative   Not on file   Social Determinants of Health   Financial Resource Strain: Not on file  Food Insecurity: Not on file  Transportation Needs: Not on file  Physical Activity: Not on file   Stress: Not on file  Social Connections: Not on file  Intimate Partner Violence: Not on file       PHYSICAL EXAM  There were no vitals filed for this visit.  There is no height or weight on file to calculate BMI.   General: The patient is well-developed and well-nourished and in no acute distress  HEENT:  Head is [place]/AT.  Sclera are anicteric.  Funduscopic exam shows normal optic discs and retinal vessels.  Neck: No carotid bruits are noted.  The neck is nontender.  Cardiovascular: The heart has a regular rate and rhythm with a  normal S1 and S2. There were no murmurs, gallops or rubs.    Skin: Extremities are without rash or  edema.  Musculoskeletal:  Back is nontender  Neurologic Exam  Mental status: The patient is alert and oriented x 3 at the time of the examination. The patient has apparent normal recent and remote memory, with an apparently normal attention span and concentration ability.   Speech is normal.  Cranial nerves: Extraocular movements are full. Pupils are equal, round, and reactive to light and accomodation.  Visual fields are full.  Facial symmetry is present. There is good facial sensation to soft touch bilaterally.Facial strength is normal.  Trapezius and sternocleidomastoid strength is normal. No dysarthria is noted.  The tongue is midline, and the patient has symmetric elevation of the soft palate. No obvious hearing deficits are noted.  Motor:  Muscle bulk is normal.   Tone is normal. Strength is  5 / 5 in all 4 extremities.   Sensory: Sensory testing is intact to pinprick, soft touch and vibration sensation in all 4 extremities.  Coordination: Cerebellar testing reveals good finger-nose-finger and heel-to-shin bilaterally.  Gait and station: Station is normal.   Gait is normal. Tandem gait is normal. Romberg is negative.   Reflexes: Deep tendon reflexes are symmetric and normal bilaterally.   Plantar responses are flexor.    DIAGNOSTIC DATA (LABS,  IMAGING, TESTING) - I reviewed patient records, labs, notes, testing and imaging myself where available.  Lab Results  Component Value Date   WBC 8.6 08/29/2022   HGB 10.8 (L) 08/29/2022   HCT 31.3 (L) 08/29/2022   MCV 96.6 08/29/2022   PLT 210 08/29/2022      Component Value Date/Time   NA 139 10/03/2022 0956   NA 145 (H) 01/16/2018 0848   K 4.2 10/03/2022 0956   CL 106 10/03/2022 0956   CO2 23 10/03/2022 0956   GLUCOSE 194 (H) 10/03/2022 0956   BUN 28 (H) 10/03/2022 0956   BUN 24 01/16/2018 0848   CREATININE 1.13 (H) 10/03/2022 0956   CALCIUM 9.7 10/03/2022 0956   PROT 7.0 08/29/2022 1751   ALBUMIN 3.9 08/29/2022 1751   AST 20 08/29/2022 1751   ALT 13 08/29/2022 1751   ALKPHOS 79 08/29/2022 1751   BILITOT 1.4 (H) 08/29/2022 1751   GFRNONAA 49 (L) 10/03/2022 0956   GFRAA >60 07/10/2018 0937   No results found for: "CHOL", "HDL", "LDLCALC", "LDLDIRECT", "TRIG", "CHOLHDL" No results found for: "HGBA1C" No results found for: "VITAMINB12" No results found for: "TSH"     ASSESSMENT AND PLAN  ***   Lissie Hinesley A. Felecia Shelling, MD, [facility] [id number], 0000000 PM Certified in Neurology, Clinical Neurophysiology, Sleep Medicine and Neuroimaging  [facility] [address] [place], [place] [address] [phone]

## 2023-03-15 ENCOUNTER — Encounter: Payer: Self-pay | Admitting: Neurology

## 2023-03-15 ENCOUNTER — Ambulatory Visit (INDEPENDENT_AMBULATORY_CARE_PROVIDER_SITE_OTHER): Payer: Medicare Other | Admitting: Neurology

## 2023-03-15 ENCOUNTER — Telehealth: Payer: Self-pay | Admitting: Neurology

## 2023-03-15 VITALS — BP 192/82 | HR 64 | Ht 61.5 in | Wt 189.0 lb

## 2023-03-15 DIAGNOSIS — R739 Hyperglycemia, unspecified: Secondary | ICD-10-CM

## 2023-03-15 DIAGNOSIS — F419 Anxiety disorder, unspecified: Secondary | ICD-10-CM | POA: Diagnosis not present

## 2023-03-15 DIAGNOSIS — I10 Essential (primary) hypertension: Secondary | ICD-10-CM

## 2023-03-15 DIAGNOSIS — R39198 Other difficulties with micturition: Secondary | ICD-10-CM | POA: Diagnosis not present

## 2023-03-15 DIAGNOSIS — R269 Unspecified abnormalities of gait and mobility: Secondary | ICD-10-CM | POA: Diagnosis not present

## 2023-03-15 DIAGNOSIS — R200 Anesthesia of skin: Secondary | ICD-10-CM

## 2023-03-15 MED ORDER — SERTRALINE HCL 50 MG PO TABS: 50.0000 mg | ORAL_TABLET | Freq: Every day | ORAL | 5 refills | Status: DC

## 2023-03-15 NOTE — Telephone Encounter (Signed)
medicare/BCBS sup NPR sent to GI [phone] 

## 2023-03-22 LAB — MULTIPLE MYELOMA PANEL, SERUM
Albumin SerPl Elph-Mcnc: 3.8 g/dL (ref 2.9–4.4)
Albumin/Glob SerPl: 1.4 (ref 0.7–1.7)
Alpha 1: 0.2 g/dL (ref 0.0–0.4)
Alpha2 Glob SerPl Elph-Mcnc: 0.9 g/dL (ref 0.4–1.0)
B-Globulin SerPl Elph-Mcnc: 1 g/dL (ref 0.7–1.3)
Gamma Glob SerPl Elph-Mcnc: 0.8 g/dL (ref 0.4–1.8)
Globulin, Total: 2.8 g/dL (ref 2.2–3.9)
IgA/Immunoglobulin A, Serum: 183 mg/dL (ref 64–422)
IgG (Immunoglobin G), Serum: 879 mg/dL (ref 586–1602)
IgM (Immunoglobulin M), Srm: 132 mg/dL (ref 26–217)
Total Protein: 6.6 g/dL (ref 6.0–8.5)

## 2023-03-22 LAB — VITAMIN B12: Vitamin B-12: 842 pg/mL (ref 232–1245)

## 2023-03-22 LAB — HEMOGLOBIN A1C
Est. average glucose Bld gHb Est-mCnc: 209 mg/dL
Hgb A1c MFr Bld: 8.9 % — ABNORMAL HIGH (ref 4.8–5.6)

## 2023-03-26 ENCOUNTER — Ambulatory Visit (INDEPENDENT_AMBULATORY_CARE_PROVIDER_SITE_OTHER): Payer: Medicare Other | Admitting: Podiatry

## 2023-03-26 DIAGNOSIS — B351 Tinea unguium: Secondary | ICD-10-CM | POA: Diagnosis not present

## 2023-03-26 DIAGNOSIS — M79675 Pain in left toe(s): Secondary | ICD-10-CM

## 2023-03-26 DIAGNOSIS — M79674 Pain in right toe(s): Secondary | ICD-10-CM | POA: Diagnosis not present

## 2023-03-26 DIAGNOSIS — Q828 Other specified congenital malformations of skin: Secondary | ICD-10-CM

## 2023-03-26 DIAGNOSIS — E1149 Type 2 diabetes mellitus with other diabetic neurological complication: Secondary | ICD-10-CM

## 2023-03-26 NOTE — Progress Notes (Signed)
Chief Complaint  Patient presents with   Nail Problem    Nail trim     82 year old female presents the office with above concerns.  She is the nails and calluses are causing discomfort.  No swelling redness or drainage.  She has seen neurology.    PCP: Hamrick, Durward Fortes, MD  Objective: AAO 3, NAD-wearing orthotics DP/PT pulses palpable, CRT less than 3 seconds Nails hypertrophic, dystrophic, elongated, brittle, discolored 10. There is tenderness overlying the nails 1-5 bilaterally. There is no surrounding erythema or drainage along the nail sites. Hyperkeratotic lesions bilateral medial arch on the area of the talar prominence.  No ongoing ulceration, drainage or any signs of infection.  The areas are preulcerative. Significant flatfoot is present.  Unable to appreciate any area pinpoint tenderness.  No significant pain on exam.  No open lesions and no erythema bilaterally. No pain with calf compression, swelling, warmth, erythema.  Assessment: Patient presents with symptomatic onychomycosis, hyperkeratotic lesions  Plan: -Treatment options including alternatives, risks, complications were discussed -Nails sharply debrided 10 without complication/bleeding. -Hyperkeratotic lesion sharply debrided x 2 without any complications or bleeding.  Continue moisturizer, offloading. -Continue follow up with neurology.  -Continue physical therapy.  If the pain right-sided reoccurs to let me know.  Vivi Barrack DPM

## 2023-03-30 DIAGNOSIS — H25813 Combined forms of age-related cataract, bilateral: Secondary | ICD-10-CM | POA: Diagnosis not present

## 2023-04-05 ENCOUNTER — Encounter: Payer: Self-pay | Admitting: Internal Medicine

## 2023-04-05 ENCOUNTER — Ambulatory Visit: Payer: Medicare Other | Attending: Internal Medicine | Admitting: Internal Medicine

## 2023-04-05 VITALS — BP 180/80 | HR 59 | Ht 61.5 in | Wt 186.4 lb

## 2023-04-05 DIAGNOSIS — I10 Essential (primary) hypertension: Secondary | ICD-10-CM

## 2023-04-05 DIAGNOSIS — R2681 Unsteadiness on feet: Secondary | ICD-10-CM | POA: Diagnosis not present

## 2023-04-05 NOTE — Progress Notes (Signed)
Follow-up Outpatient Visit Date: 04/05/2023  Primary Care Provider: Ailene Ravel, MD [address] [place] [address]  Chief Complaint: Follow-up labile hypertension  HPI:  [name] is a 82 y.o. female with history of hypertension, hyperlipidemia, type 2 diabetes mellitus, chronic kidney disease stage II, GERD, and obesity, who presents for follow-up of hypertension.  She was last seen in our office in October by Ward Givens, NP, at which time her blood pressure remained poorly controlled.  Clonidine patch was increased at that time.  Intolerance to multiple other antihypertensive medications has made blood pressure control challenging.  Unfortunately, she did not feel well with increased dose of clonidine and was advised to return back to the 0.1 mg patch.  Today, Laura Garner reports that she is feeling fairly well.  She has been dealing with some balance problems for which she has been evaluated by ENT and physical therapy.  She also saw a neurologist who suggested that she may have polyneuropathy.  She is scheduled for brain and cervical spine MRI next week.  She has not had any falls.  She does not report frank lightheadedness.  Chronic exertional dyspnea is stable.  Leg edema has been minimal.  In regard to her blood pressure, readings are typically normal at home.  --------------------------------------------------------------------------------------------------  Past Medical History:  Diagnosis Date   Anemia    Anxiety    no meds   Arthritis    neck - otc med prn   Chest pain    a. 10/2017 MV: EF 55-65%, no ischemia, infarct.   Diabetes mellitus    type 2   Diastolic dysfunction    a. 10/2017 Echo: EF 55-60%, no rwma, Gr1 DD, mod dil LA, PASP .   GERD (gastroesophageal reflux disease)    Heart palpitations    hx - caused by medication   History of kidney stones    multiple kidney stones -passed some stones and surgery x 3    Hyperlipidemia    Hypertension    a.  Difficult to control-->10/2017 Renal Duplex: < 60% bilat RAS.   Left breast mass    PAH (pulmonary artery hypertension)    a. 10/2017 Echo: PASP .   RBBB    Reflux    Seasonal allergies    Sleep apnea    wears CPAP nightly   SVD (spontaneous vaginal delivery)    x 3   Past Surgical History:  Procedure Laterality Date   BREAST LUMPECTOMY WITH RADIOACTIVE SEED LOCALIZATION Left 07/19/2018   Procedure: LEFT BREAST LUMPECTOMY WITH RADIOACTIVE SEED LOCALIZATION ERAS PATHWAY;  Surgeon: Griselda Miner, MD;  Location: [facility];  Service: General;  Laterality: Left;   HYSTEROSCOPY WITH D & C N/A 03/22/2017   Procedure: DILATATION AND CURETTAGE /HYSTEROSCOPY;  Surgeon: Tracey Harries, MD;  Location: [facility];  Service: Gynecology;  Laterality: N/A;   lithrotripsy     surgery x 3   right foot surgery     repair tendon   SHOULDER SURGERY     x 2 - right and left    Current Meds  Medication Sig   carvedilol (COREG) 12.5 MG tablet Take 12.5 mg by mouth 2 (two) times daily with a meal.   chlorthalidone (HYGROTON) 25 MG tablet Take 1 tablet (25 mg total) by mouth daily.   cloNIDine (CATAPRES - DOSED IN MG/24 HR) 0.2 mg/24hr patch Place 0.1 mg onto the skin once a week.   famotidine (PEPCID) 20 MG tablet Take  1 tablet (20 mg total) by mouth 2 (two) times daily.   loratadine (CLARITIN) 10 MG tablet Take 10 mg by mouth daily.   losartan (COZAAR) 100 MG tablet Take 100 mg by mouth at bedtime.    Multiple Vitamin (MULTIVITAMIN) capsule Take 1 capsule by mouth daily.    NON FORMULARY Equalactin 2 tabs PO daily   Polyethyl Glycol-Propyl Glycol (SYSTANE ULTRA OP) Place 1 drop into both eyes daily as needed (dry eyes).   pravastatin (PRAVACHOL) 20 MG tablet Take 20 mg by mouth daily.    Allergies: Iodinated contrast media, Amlodipine, Avelox [moxifloxacin hcl], Doxazosin, Hydralazine, Iodine, Moxifloxacin, and Trimethoprim  Social History   Tobacco Use   Smoking status: Never    Smokeless tobacco: Never  Vaping Use   Vaping Use: Never used  Substance Use Topics   Alcohol use: No    Alcohol/week: 0.0 standard drinks of alcohol   Drug use: No    Family History  Problem Relation Age of Onset   Heart failure Mother    Hypertension Father     Review of Systems: A 12-system review of systems was performed and was negative except as noted in the HPI.  --------------------------------------------------------------------------------------------------  Physical Exam: BP (!) 170/80 (BP Location: Left Arm, Patient Position: Sitting, Cuff Size: Large)   Pulse (!) 59   Ht 5' 1.5" (1.562 m)   Wt 186 lb 6 oz (84.5 kg)   SpO2 97%   BMI 34.64 kg/m  Repeat BP: 180/80  General:  NAD. Neck: No JVD or HJR. Lungs: Clear to auscultation bilaterally without wheezes or crackles. Heart: Bradycardic but regular without murmurs, rubs, or gallops. Abdomen: Soft, nontender, nondistended. Extremities: Trace pretibial edema bilaterally.  EKG: Sinus bradycardia with left axis deviation, right bundle branch block, and borderline LVH.  No significant change since 10/03/2022.  Lab Results  Component Value Date   WBC 8.6 08/29/2022   HGB 10.8 (L) 08/29/2022   HCT 31.3 (L) 08/29/2022   MCV 96.6 08/29/2022   PLT 210 08/29/2022    Lab Results  Component Value Date   NA 139 10/03/2022   K 4.2 10/03/2022   CL 106 10/03/2022   CO2 23 10/03/2022   BUN 28 (H) 10/03/2022   CREATININE 1.13 (H) 10/03/2022   GLUCOSE 194 (H) 10/03/2022   ALT 13 08/29/2022    No results found for: "CHOL", "HDL", "LDLCALC", "LDLDIRECT", "TRIG", "CHOLHDL"  --------------------------------------------------------------------------------------------------  ASSESSMENT AND PLAN: Uncontrolled hypertension: Blood pressure remains significantly elevated today, though home readings are typically well within the normal range.  It is unclear to me if this is driven exclusively by whitecoat hypertension or  if home blood pressure readings are inaccurate.  Unfortunately, therapeutic options for treatment of her hypertension are somewhat limited as she is already on maximum doses of chlorthalidone and losartan.  Baseline resting bradycardia precludes further escalation of carvedilol as well.  Laura Garner did not tolerate escalation of clonidine nor has she amlodipine or hydralazine.  I have recommended that Laura Garner wear a 24-hour ambulatory blood pressure monitor to better understand her home blood pressures, though she wishes to defer this until she has undergone her brain MRI cataract surgery.  I advised her that significantly elevated blood pressure readings would 10 Shiley delay her cataract surgery.  Nonetheless, she declined further workup and medication changes today.  Sodium restriction was encouraged.  Gait instability: This seems to be driven primarily by balance issues and not ongoing workup per neurology and ENT.  If no obvious  cause for her balance problems is identified by these specialists, we may need to consider repeating an echocardiogram.  Follow-up: Return to clinic in 3 months.  Yvonne Kendall, MD 04/05/2023 9:40 AM

## 2023-04-05 NOTE — Patient Instructions (Addendum)
Medication Instructions:  Your Physician recommend you continue on your current medication as directed.    *If you need a refill on your cardiac medications before your next appointment, please call your pharmacy*   Lab Work: None ordered    Testing/Procedures: None ordered    Follow-Up: At [facility], you and your health needs are our priority.  As part of our continuing mission to provide you with exceptional heart care, we have created designated Provider Care Teams.  These Care Teams include your primary Cardiologist (physician) and Advanced Practice Providers (APPs -  Physician Assistants and Nurse Practitioners) who all work together to provide you with the care you need, when you need it.  We recommend signing up for the patient portal called "MyChart".  Sign up information is provided on this After Visit Summary.  MyChart is used to connect with patients for Virtual Visits (Telemedicine).  Patients are able to view lab/test results, encounter notes, upcoming appointments, etc.  Non-urgent messages can be sent to your provider as well.   To learn more about what you can do with MyChart, go to [email or web address].    Your next appointment:   3 month(s)  Provider:   You may see one of the following Advanced Practice Providers on your designated Care Team:   Nicolasa Ducking, NP Eula Listen, PA-C Cadence Fransico Michael, PA-C Charlsie Quest, NP

## 2023-04-06 ENCOUNTER — Other Ambulatory Visit: Payer: Medicare Other

## 2023-04-07 ENCOUNTER — Encounter: Payer: Self-pay | Admitting: Internal Medicine

## 2023-04-07 DIAGNOSIS — R2681 Unsteadiness on feet: Secondary | ICD-10-CM | POA: Insufficient documentation

## 2023-04-09 ENCOUNTER — Ambulatory Visit
Admission: RE | Admit: 2023-04-09 | Discharge: 2023-04-09 | Disposition: A | Discharge status: Home / Self Care | Payer: Medicare Other | Source: Ambulatory Visit | Attending: Neurology | Admitting: Neurology

## 2023-04-09 DIAGNOSIS — R269 Unspecified abnormalities of gait and mobility: Secondary | ICD-10-CM

## 2023-04-09 DIAGNOSIS — R39198 Other difficulties with micturition: Secondary | ICD-10-CM

## 2023-04-09 DIAGNOSIS — R200 Anesthesia of skin: Secondary | ICD-10-CM | POA: Diagnosis not present

## 2023-04-11 ENCOUNTER — Telehealth: Payer: Self-pay | Admitting: Internal Medicine

## 2023-04-11 DIAGNOSIS — I10 Essential (primary) hypertension: Secondary | ICD-10-CM

## 2023-04-11 DIAGNOSIS — R030 Elevated blood-pressure reading, without diagnosis of hypertension: Secondary | ICD-10-CM

## 2023-04-11 NOTE — Telephone Encounter (Signed)
Spoke with pt's daughter who reported they would like to proceed with 24 hour BP monitor. Order placed

## 2023-04-11 NOTE — Telephone Encounter (Signed)
Per Dr Serita Kyle last note:   I have recommended that Laura Garner wear a 24-hour ambulatory blood pressure monitor to better understand her home blood pressures, though she wishes to defer this until she has undergone her brain MRI cataract surgery. I advised her that significantly elevated blood pressure readings would 10 Shiley delay her cataract surgery. Nonetheless, she declined further workup and medication changes today. Sodium restriction was encouraged.    I spoke with her daughter and she would like to go ahead and pursue the BP monitor... I advised her that I will forward to his nurse and we will reach out with the details of getting this ordered for her.

## 2023-04-11 NOTE — Telephone Encounter (Signed)
We will proceed with 24 hour ambulatory BP monitor.  Yvonne Kendall, MD [facility]

## 2023-04-11 NOTE — Telephone Encounter (Signed)
Pt daughter called in wanting to set up 24 hr bp monitor as discussed at pt's last visit.

## 2023-04-13 NOTE — Telephone Encounter (Signed)
Returned call to patient's daughter Laura Garner (OK per [facility]).  Laura Garner would like to have 24 hour BP monitor started prior to cataract surgery scheduled for 05/01/23.   Explained to Bethel that we only have 2 ambulatory BP monitors so it can take some time to get in to have those placed. Spoke with monitor technician and was informed that it could be 1-2 weeks before patient is called to have monitor placed.  Laura Garner verbalized understanding and expressed appreciation for follow-up.  When scheduling to have patient come in, Malo requests she be called because patient is hard of hearing and she is also patient's appt keeper/transportation. Best contact number for Laura Garner: [phone].

## 2023-04-13 NOTE — Telephone Encounter (Signed)
24 hour ambulatory blood pressure monitor scheduled for Monday 04/23/23, 9:30AM at [facility] location.

## 2023-04-13 NOTE — Telephone Encounter (Signed)
Patient's daughter is calling to get the patient's 24 hour BP Monitor scheduled. Patient's daughter stated she would like to have this taking care of before her mother's surgery May 14th. Please advise.

## 2023-04-20 DIAGNOSIS — H25812 Combined forms of age-related cataract, left eye: Secondary | ICD-10-CM | POA: Diagnosis not present

## 2023-04-20 DIAGNOSIS — Z01818 Encounter for other preprocedural examination: Secondary | ICD-10-CM | POA: Diagnosis not present

## 2023-04-23 ENCOUNTER — Ambulatory Visit: Payer: Medicare Other | Attending: Internal Medicine

## 2023-04-23 DIAGNOSIS — I10 Essential (primary) hypertension: Secondary | ICD-10-CM | POA: Diagnosis not present

## 2023-04-23 DIAGNOSIS — R030 Elevated blood-pressure reading, without diagnosis of hypertension: Secondary | ICD-10-CM | POA: Diagnosis not present

## 2023-05-01 DIAGNOSIS — Z6835 Body mass index (BMI) 35.0-35.9, adult: Secondary | ICD-10-CM | POA: Diagnosis not present

## 2023-05-01 DIAGNOSIS — H259 Unspecified age-related cataract: Secondary | ICD-10-CM | POA: Diagnosis not present

## 2023-05-01 DIAGNOSIS — H04563 Stenosis of bilateral lacrimal punctum: Secondary | ICD-10-CM | POA: Diagnosis not present

## 2023-05-01 DIAGNOSIS — H04123 Dry eye syndrome of bilateral lacrimal glands: Secondary | ICD-10-CM | POA: Diagnosis not present

## 2023-05-01 DIAGNOSIS — E1136 Type 2 diabetes mellitus with diabetic cataract: Secondary | ICD-10-CM | POA: Diagnosis not present

## 2023-05-01 DIAGNOSIS — E119 Type 2 diabetes mellitus without complications: Secondary | ICD-10-CM | POA: Diagnosis not present

## 2023-05-01 DIAGNOSIS — E785 Hyperlipidemia, unspecified: Secondary | ICD-10-CM | POA: Diagnosis not present

## 2023-05-01 DIAGNOSIS — E669 Obesity, unspecified: Secondary | ICD-10-CM | POA: Diagnosis not present

## 2023-05-01 DIAGNOSIS — I110 Hypertensive heart disease with heart failure: Secondary | ICD-10-CM | POA: Diagnosis not present

## 2023-05-01 DIAGNOSIS — H25812 Combined forms of age-related cataract, left eye: Secondary | ICD-10-CM | POA: Diagnosis not present

## 2023-05-11 DIAGNOSIS — E1149 Type 2 diabetes mellitus with other diabetic neurological complication: Secondary | ICD-10-CM | POA: Diagnosis not present

## 2023-05-11 DIAGNOSIS — N189 Chronic kidney disease, unspecified: Secondary | ICD-10-CM | POA: Diagnosis not present

## 2023-05-11 DIAGNOSIS — E782 Mixed hyperlipidemia: Secondary | ICD-10-CM | POA: Diagnosis not present

## 2023-05-17 DIAGNOSIS — Z6835 Body mass index (BMI) 35.0-35.9, adult: Secondary | ICD-10-CM | POA: Diagnosis not present

## 2023-05-17 DIAGNOSIS — D631 Anemia in chronic kidney disease: Secondary | ICD-10-CM | POA: Diagnosis not present

## 2023-05-17 DIAGNOSIS — E1149 Type 2 diabetes mellitus with other diabetic neurological complication: Secondary | ICD-10-CM | POA: Diagnosis not present

## 2023-05-17 DIAGNOSIS — N189 Chronic kidney disease, unspecified: Secondary | ICD-10-CM | POA: Diagnosis not present

## 2023-05-17 DIAGNOSIS — G319 Degenerative disease of nervous system, unspecified: Secondary | ICD-10-CM | POA: Diagnosis not present

## 2023-05-17 DIAGNOSIS — K219 Gastro-esophageal reflux disease without esophagitis: Secondary | ICD-10-CM | POA: Diagnosis not present

## 2023-05-17 DIAGNOSIS — F330 Major depressive disorder, recurrent, mild: Secondary | ICD-10-CM | POA: Diagnosis not present

## 2023-05-17 DIAGNOSIS — E782 Mixed hyperlipidemia: Secondary | ICD-10-CM | POA: Diagnosis not present

## 2023-05-17 DIAGNOSIS — I10 Essential (primary) hypertension: Secondary | ICD-10-CM | POA: Diagnosis not present

## 2023-05-17 DIAGNOSIS — N1831 Chronic kidney disease, stage 3a: Secondary | ICD-10-CM | POA: Diagnosis not present

## 2023-05-23 ENCOUNTER — Other Ambulatory Visit: Payer: Self-pay

## 2023-05-23 DIAGNOSIS — Z79899 Other long term (current) drug therapy: Secondary | ICD-10-CM

## 2023-05-28 ENCOUNTER — Telehealth: Payer: Self-pay | Admitting: Internal Medicine

## 2023-05-28 NOTE — Telephone Encounter (Signed)
Left a message for the patient to call back.  

## 2023-05-28 NOTE — Telephone Encounter (Signed)
Patient's daughter called stating her mother's PCP had lab work done two weeks ago and sent the lab work sent to [place].  She was calling to find out is we received it.  She also wanted to know it that would be enough so her mother wouldn't have to repeat labs for her kidney function as Dr. Okey Dupre wanted her to have lab work done for that.  Please advise.  She also wanted to make [place] aware that her mother just started taking jardiance 10mg  on 6/8.

## 2023-05-28 NOTE — Telephone Encounter (Signed)
Recent lab work from [facility] under [employer] tab.   Will forward to Dr. Okey Dupre for review

## 2023-05-28 NOTE — Telephone Encounter (Signed)
Please let Ms. Moultry know that I have reviewed her outside labs from 5/24, which show normal creatinine and potassium.  I recommend starting spironolactone 25 mg daily, as previously recommended, and repeating a BMP 1 week after starting this medications.  Yvonne Kendall, MD [facility]

## 2023-05-29 NOTE — Telephone Encounter (Signed)
Pt's daughter made aware of MD's recommendations. Daughter stated pt was recently started on jardiance by pcp and pt would prefer to hold off on spironlactone until follow up in July. She stated pt doesn't like to start more than 1 medication at a time to allow easy identification of side effects.  Will forward to MD to make aware

## 2023-05-29 NOTE — Telephone Encounter (Signed)
Ok.  I will defer management to her PCP.  She can follow-up with [place] on an as needed basis.  Yvonne Kendall, MD [facility]

## 2023-05-31 NOTE — Telephone Encounter (Signed)
Left a message for the patient to call back.  

## 2023-05-31 NOTE — Telephone Encounter (Signed)
Pt's daughter made aware.

## 2023-06-01 ENCOUNTER — Ambulatory Visit (INDEPENDENT_AMBULATORY_CARE_PROVIDER_SITE_OTHER): Payer: Medicare Other | Admitting: Podiatry

## 2023-06-01 DIAGNOSIS — E1149 Type 2 diabetes mellitus with other diabetic neurological complication: Secondary | ICD-10-CM

## 2023-06-01 DIAGNOSIS — Q828 Other specified congenital malformations of skin: Secondary | ICD-10-CM

## 2023-06-01 DIAGNOSIS — B351 Tinea unguium: Secondary | ICD-10-CM

## 2023-06-01 DIAGNOSIS — M79675 Pain in left toe(s): Secondary | ICD-10-CM

## 2023-06-01 DIAGNOSIS — M79674 Pain in right toe(s): Secondary | ICD-10-CM

## 2023-06-03 NOTE — Progress Notes (Signed)
Chief Complaint  Patient presents with   Foot Problem    RFC    82 year old female presents the office with above concerns.  She is the nails and calluses are causing discomfort. This is where she gets most of her pain. No swelling redness or drainage.    PCP: Hamrick, Durward Fortes, MD  Objective: AAO 3, NAD-wearing orthotics DP/PT pulses palpable, CRT less than 3 seconds Nails hypertrophic, dystrophic, elongated, brittle, discolored 10. There is tenderness overlying the nails 1-5 bilaterally. There is no surrounding erythema or drainage along the nail sites. Hyperkeratotic lesions bilateral medial arch on the area of the talar prominence.  No ongoing ulceration, drainage or any signs of infection.  Significant flatfoot is present.   No other areas of tenderness today. No open lesions and no erythema bilaterally. No pain with calf compression, swelling, warmth, erythema.  Assessment: Patient presents with symptomatic onychomycosis, hyperkeratotic lesions  Plan: -Treatment options including alternatives, risks, complications were discussed -Nails sharply debrided 10 without complication/bleeding. -Hyperkeratotic lesion sharply debrided x 2 without any complications or bleeding.  Continue moisturizer, offloading.  Vivi Barrack DPM

## 2023-06-12 DIAGNOSIS — E669 Obesity, unspecified: Secondary | ICD-10-CM | POA: Diagnosis not present

## 2023-06-12 DIAGNOSIS — H04203 Unspecified epiphora, bilateral lacrimal glands: Secondary | ICD-10-CM | POA: Diagnosis not present

## 2023-06-12 DIAGNOSIS — Z6835 Body mass index (BMI) 35.0-35.9, adult: Secondary | ICD-10-CM | POA: Diagnosis not present

## 2023-06-12 DIAGNOSIS — Z961 Presence of intraocular lens: Secondary | ICD-10-CM | POA: Diagnosis not present

## 2023-06-12 DIAGNOSIS — I10 Essential (primary) hypertension: Secondary | ICD-10-CM | POA: Diagnosis not present

## 2023-06-12 DIAGNOSIS — H25811 Combined forms of age-related cataract, right eye: Secondary | ICD-10-CM | POA: Diagnosis not present

## 2023-06-12 DIAGNOSIS — H269 Unspecified cataract: Secondary | ICD-10-CM | POA: Diagnosis not present

## 2023-06-12 DIAGNOSIS — Z7984 Long term (current) use of oral hypoglycemic drugs: Secondary | ICD-10-CM | POA: Diagnosis not present

## 2023-07-04 DIAGNOSIS — M47894 Other spondylosis, thoracic region: Secondary | ICD-10-CM | POA: Diagnosis not present

## 2023-07-04 DIAGNOSIS — M9902 Segmental and somatic dysfunction of thoracic region: Secondary | ICD-10-CM | POA: Diagnosis not present

## 2023-07-04 DIAGNOSIS — M4727 Other spondylosis with radiculopathy, lumbosacral region: Secondary | ICD-10-CM | POA: Diagnosis not present

## 2023-07-04 DIAGNOSIS — M9904 Segmental and somatic dysfunction of sacral region: Secondary | ICD-10-CM | POA: Diagnosis not present

## 2023-07-04 DIAGNOSIS — M47816 Spondylosis without myelopathy or radiculopathy, lumbar region: Secondary | ICD-10-CM | POA: Diagnosis not present

## 2023-07-04 DIAGNOSIS — M9903 Segmental and somatic dysfunction of lumbar region: Secondary | ICD-10-CM | POA: Diagnosis not present

## 2023-07-12 ENCOUNTER — Other Ambulatory Visit
Admission: RE | Admit: 2023-07-12 | Discharge: 2023-07-12 | Disposition: A | Discharge status: Home / Self Care | Payer: Medicare Other | Source: Ambulatory Visit | Attending: Internal Medicine | Admitting: Internal Medicine

## 2023-07-12 ENCOUNTER — Ambulatory Visit: Payer: Medicare Other | Attending: Internal Medicine | Admitting: Internal Medicine

## 2023-07-12 ENCOUNTER — Encounter: Payer: Self-pay | Admitting: Internal Medicine

## 2023-07-12 VITALS — BP 190/96 | HR 64 | Ht 61.5 in | Wt 186.2 lb

## 2023-07-12 DIAGNOSIS — Z79899 Other long term (current) drug therapy: Secondary | ICD-10-CM | POA: Insufficient documentation

## 2023-07-12 LAB — BASIC METABOLIC PANEL
Anion gap: 8 (ref 5–15)
BUN: 32 mg/dL — ABNORMAL HIGH (ref 8–23)
CO2: 27 mmol/L (ref 22–32)
Calcium: 9.6 mg/dL (ref 8.9–10.3)
Chloride: 103 mmol/L (ref 98–111)
Creatinine, Ser: 1.29 mg/dL — ABNORMAL HIGH (ref 0.44–1.00)
GFR, Estimated: 42 mL/min — ABNORMAL LOW (ref >60)
Glucose, Bld: 204 mg/dL — ABNORMAL HIGH (ref 70–99)
Potassium: 4.3 mmol/L (ref 3.5–5.1)
Sodium: 138 mmol/L (ref 135–145)

## 2023-07-12 MED ORDER — SPIRONOLACTONE 25 MG PO TABS: 25.0000 mg | ORAL_TABLET | Freq: Every day | ORAL | 3 refills | Status: DC

## 2023-07-12 NOTE — Patient Instructions (Signed)
Medication Instructions:  Your physician recommends the following medication changes.  START TAKING: Spironolactone 25 mg by mouth daily    *If you need a refill on your cardiac medications before your next appointment, please call your pharmacy*   Lab Work: Your provider would like for you to have following labs drawn today: (BMP), then return in 1 week for repeat (BMP).   Please go to the [facility] entrance and check in at the front desk.  You do not need an appointment.  They are open from 7am-6 pm.     Testing/Procedures: None ordered today   Follow-Up: At [facility], you and your health needs are our priority.  As part of our continuing mission to provide you with exceptional heart care, we have created designated Provider Care Teams.  These Care Teams include your primary Cardiologist (physician) and Advanced Practice Providers (APPs -  Physician Assistants and Nurse Practitioners) who all work together to provide you with the care you need, when you need it.  We recommend signing up for the patient portal called "MyChart".  Sign up information is provided on this After Visit Summary.  MyChart is used to connect with patients for Virtual Visits (Telemedicine).  Patients are able to view lab/test results, encounter notes, upcoming appointments, etc.  Non-urgent messages can be sent to your provider as well.   To learn more about what you can do with MyChart, go to [email or web address].    Your next appointment:   4-6 week(s)  Provider:   You may see Yvonne Kendall, MD or one of the following Advanced Practice Providers on your designated Care Team:   Nicolasa Ducking, NP Eula Listen, PA-C Cadence Fransico Michael, PA-C Charlsie Quest, NP

## 2023-07-12 NOTE — Progress Notes (Signed)
Cardiology Office Note:  .   Date:  07/12/2023  ID:  Laura Garner, DOB [birth year], MRN [id number] PCP: Ailene Ravel, MD  [facility] Providers Cardiologist:  Yvonne Kendall, MD     History of Present Illness: .   Laura Garner is a 82 y.o. female with history of hypertension, hyperlipidemia, type 2 diabetes mellitus, chronic kidney disease stage II, GERD, and obesity, who presents for follow-up of hypertension.  I last saw her in April, at which time she was feeling well.  She was struggling with some balance issues and was told that she may have polyneuropathy by her neurologist.  MRIs of the brain and cervical spine were scheduled for the following week.  Chronic exertional dyspnea was stable.  Blood pressure was poorly controlled at our visit.  She reported normal readings at home.  We agreed to perform a 24-hour blood pressure monitor, which showed labile readings though overall mildly elevated.  I suggested that we add spironolactone 25 mg daily, though she did not begin this as she had just been started on empagliflozin by her PCP and did not wish to make too many medication changes at once.  She feels like empagliflozin has not helped her blood sugars very much but has brought down her BP.  Home BP's are typically 120-150 mmHg systolic.  Ms. Shore has been feeling fairly well without chest pain, shortness of breath, palpitations, lightheadedness, and edema.  She is concerned about how much she voids, given that she is on chlorthalidone and empagloflozin.  As she has completed her cataract surgeries, she is hoping to begin exercising again.  She continues to have some balance problems.  ROS: See HPI  Studies Reviewed: .        24-hour blood pressure monitor (04/23/2023): Mean blood pressure 145/58 (range 119-200/44-92).  Pharmacologic MPI (11/12/2017): Low risk study without evidence of significant ischemia or scar.  LVEF 55-65%.  TTE (11/02/2017): Normal LV size with mild LVH.   LVEF 55-6% with normal wall motion and grade 1 diastolic dysfunction.  Normal RV size and function.  Mildly calcified aortic valve without stenosis or regurgitation.  Mitral annular calcification without stenosis or regurgitation.  Mild tricuspid regurgitation.  No pericardial effusion.  Renal artery Doppler (11/02/2017): Less than 60% stenosis of both renal arteries.  Risk Assessment/Calculations:     HYPERTENSION CONTROL Vitals:   07/12/23 0906 07/12/23 0910  BP: (!) 180/80 (!) 190/96    The patient's blood pressure is elevated above target today.  In order to address the patient's elevated BP: A new medication was prescribed today.          Physical Exam:   VS:  BP (!) 190/96 (BP Location: Left Wrist, Patient Position: Sitting) Comment: home monitor  Pulse 64   Ht 5' 1.5" (1.562 m)   Wt 186 lb 3.2 oz (84.5 kg)   SpO2 96%   BMI 34.61 kg/m    Wt Readings from Last 3 Encounters:  07/12/23 186 lb 3.2 oz (84.5 kg)  04/05/23 186 lb 6 oz (84.5 kg)  03/15/23 189 lb (85.7 kg)    General:  NAD. Neck: No JVD or HJR. Lungs: Clear to auscultation bilaterally without wheezes or crackles. Heart: Regular rate and rhythm without murmurs, rubs, or gallops. Abdomen: Soft, nontender, nondistended. Extremities: Trace pretibial edema.  ASSESSMENT AND PLAN: .    Labile hypertension: BP not well-controlled today.  Overall trend on 24 ambulatory BP monitor was also elevated.  We have agreed  to add spironolactone 25 mg daily with BMP today and again in 1 week.  If possible, I would like to get her off clonidine.  If we continue to struggle with BP control, we will need to repeat renal artery Doppler to ensure that she has not developed significant renal artery stenosis.  If there is no significant RAS, consultation with the hypertension clinic and renal artery denervation will need to be considered.  Dispo: Return to clinic in 4-6 weeks.  Signed, Yvonne Kendall, MD

## 2023-07-20 ENCOUNTER — Other Ambulatory Visit: Payer: Self-pay

## 2023-07-20 DIAGNOSIS — Z79899 Other long term (current) drug therapy: Secondary | ICD-10-CM | POA: Diagnosis not present

## 2023-07-20 NOTE — Addendum Note (Signed)
Addended by: Parke Poisson on: 07/20/2023 10:27 AM   Modules accepted: Orders

## 2023-07-23 ENCOUNTER — Other Ambulatory Visit: Payer: Self-pay

## 2023-07-23 DIAGNOSIS — N182 Chronic kidney disease, stage 2 (mild): Secondary | ICD-10-CM

## 2023-07-23 DIAGNOSIS — Z79899 Other long term (current) drug therapy: Secondary | ICD-10-CM

## 2023-07-23 DIAGNOSIS — I10 Essential (primary) hypertension: Secondary | ICD-10-CM

## 2023-07-23 MED ORDER — SPIRONOLACTONE 25 MG PO TABS: 12.5000 mg | ORAL_TABLET | Freq: Every day | ORAL | Status: DC

## 2023-07-23 MED ORDER — CHLORTHALIDONE 25 MG PO TABS: 12.5000 mg | ORAL_TABLET | Freq: Every day | ORAL | Status: DC

## 2023-07-27 ENCOUNTER — Telehealth: Payer: Self-pay | Admitting: Internal Medicine

## 2023-07-27 NOTE — Telephone Encounter (Signed)
Patients daughter states she began the medication changes on Monday. Advised her that it would be best to wait until Monday if possible. She verbalized understanding and states they will come next week for lab work.

## 2023-07-27 NOTE — Telephone Encounter (Signed)
Daughter stated patient had change in medication on Monday and was advised she should be checked in week.  Daughter wants to know if patient can have her blood checked today instead of full 7 days next week.

## 2023-07-30 DIAGNOSIS — Z79899 Other long term (current) drug therapy: Secondary | ICD-10-CM | POA: Diagnosis not present

## 2023-07-30 DIAGNOSIS — N182 Chronic kidney disease, stage 2 (mild): Secondary | ICD-10-CM | POA: Diagnosis not present

## 2023-07-30 DIAGNOSIS — I10 Essential (primary) hypertension: Secondary | ICD-10-CM | POA: Diagnosis not present

## 2023-07-31 ENCOUNTER — Other Ambulatory Visit: Payer: Self-pay

## 2023-07-31 DIAGNOSIS — Z79899 Other long term (current) drug therapy: Secondary | ICD-10-CM

## 2023-08-03 ENCOUNTER — Ambulatory Visit: Payer: Medicare Other | Admitting: Podiatry

## 2023-08-03 DIAGNOSIS — B351 Tinea unguium: Secondary | ICD-10-CM | POA: Diagnosis not present

## 2023-08-03 DIAGNOSIS — M779 Enthesopathy, unspecified: Secondary | ICD-10-CM

## 2023-08-03 DIAGNOSIS — M79674 Pain in right toe(s): Secondary | ICD-10-CM

## 2023-08-03 DIAGNOSIS — M79675 Pain in left toe(s): Secondary | ICD-10-CM | POA: Diagnosis not present

## 2023-08-03 DIAGNOSIS — E1149 Type 2 diabetes mellitus with other diabetic neurological complication: Secondary | ICD-10-CM

## 2023-08-03 DIAGNOSIS — Q828 Other specified congenital malformations of skin: Secondary | ICD-10-CM

## 2023-08-03 NOTE — Patient Instructions (Signed)

## 2023-08-03 NOTE — Progress Notes (Unsigned)
No chief complaint on file.   82 year old female presents the office with her daughter for concerns of thick, elongated nails that she is not able to trim himself.  No swelling redness or drainage.  She also has calluses to her feet that hurt at times.  She is also asking for steroid injections today as it been sometime and she is beginning some increased discomfort to her feet.  No injuries.   PCP: Hamrick, Durward Fortes, MD  Objective: AAO 3, NAD-wearing orthotics DP/PT pulses palpable, CRT less than 3 seconds Nails hypertrophic, dystrophic, elongated, brittle, discolored 10. There is tenderness overlying the nails 1-5 bilaterally. There is no surrounding erythema or drainage along the nail sites. Hyperkeratotic lesions bilateral medial arch on the area of the talar prominence.  No ongoing ulceration, drainage or any signs of infection.  Significant flatfoot is present.  Tenderness palpation along sinus tarsi bilaterally.  Decreased range of motion of the subtalar joint.  No other areas of pinpoint tenderness noted today. No other areas of tenderness today. No open lesions and no erythema bilaterally. No pain with calf compression, swelling, warmth, erythema.  Assessment: Patient presents with symptomatic onychomycosis, hyperkeratotic lesions; capsulitis  Plan: -Treatment options including alternatives, risks, complications were discussed -Nails sharply debrided 10 without complication/bleeding. -Hyperkeratotic lesion sharply debrided x 2 without any complications or bleeding.  Continue moisturizer, offloading. -Steroid injections performed bilaterally.  I cleaned the skin.  I then infiltrated a mixture of 1 mL of Kenalog 10, 0.5 mL of lidocaine plain, 0.5 mL of Marcaine plain into the sinus tarsi bilaterally.  Postinjection care discussed.  She tolerated well.  Continue supportive shoes.  Vivi Barrack DPM

## 2023-08-17 ENCOUNTER — Encounter: Payer: Self-pay | Admitting: Nurse Practitioner

## 2023-08-17 ENCOUNTER — Ambulatory Visit: Payer: Medicare Other | Attending: Nurse Practitioner | Admitting: Nurse Practitioner

## 2023-08-17 VITALS — BP 148/78 | HR 64 | Ht 61.5 in | Wt 177.8 lb

## 2023-08-17 DIAGNOSIS — Z79899 Other long term (current) drug therapy: Secondary | ICD-10-CM | POA: Diagnosis not present

## 2023-08-17 DIAGNOSIS — I10 Essential (primary) hypertension: Secondary | ICD-10-CM | POA: Insufficient documentation

## 2023-08-17 DIAGNOSIS — N179 Acute kidney failure, unspecified: Secondary | ICD-10-CM | POA: Insufficient documentation

## 2023-08-17 DIAGNOSIS — N182 Chronic kidney disease, stage 2 (mild): Secondary | ICD-10-CM

## 2023-08-17 NOTE — Patient Instructions (Signed)
Medication Instructions:   Your physician recommends that you continue on your current medications as directed. Please refer to the Current Medication list given to you today.  *If you need a refill on your cardiac medications before your next appointment, please call your pharmacy*   Lab Work:  Your physician recommends you have labs drawn today - BMP   If you have labs (blood work) drawn today and your tests are completely normal, you will receive your results only by: MyChart Message (if you have MyChart) OR A paper copy in the mail If you have any lab test that is abnormal or we need to change your treatment, we will call you to review the results.   Testing/Procedures:  None Ordered   Follow-Up: At [facility], you and your health needs are our priority.  As part of our continuing mission to provide you with exceptional heart care, we have created designated Provider Care Teams.  These Care Teams include your primary Cardiologist (physician) and Advanced Practice Providers (APPs -  Physician Assistants and Nurse Practitioners) who all work together to provide you with the care you need, when you need it.  We recommend signing up for the patient portal called "MyChart".  Sign up information is provided on this After Visit Summary.  MyChart is used to connect with patients for Virtual Visits (Telemedicine).  Patients are able to view lab/test results, encounter notes, upcoming appointments, etc.  Non-urgent messages can be sent to your provider as well.   To learn more about what you can do with MyChart, go to [email or web address].    Your next appointment:   2 month(s)  Provider:   Yvonne Kendall, MD or Nicolasa Ducking, NP

## 2023-08-17 NOTE — Progress Notes (Signed)
Office Visit    Patient Name: Laura Garner Date of Encounter: 08/17/2023  Primary Care Provider:  Ailene Ravel, MD Primary Cardiologist:  Yvonne Kendall, MD  Chief Complaint    82 y.o. female w/ a h/o difficult to control hypertension, hyperlipidemia, type 2 diabetes mellitus, stage II chronic kidney disease, obesity, and GERD, who presents for f/u related to HTN.  Past Medical History    Past Medical History:  Diagnosis Date   Anemia    Anxiety    no meds   Arthritis    neck - otc med prn   Chest pain    a. 10/2017 MV: EF 55-65%, no ischemia, infarct.   Diabetes mellitus    type 2   Diastolic dysfunction    a. 10/2017 Echo: EF 55-60%, no rwma, Gr1 DD, mod dil LA, PASP .   GERD (gastroesophageal reflux disease)    Heart palpitations    hx - caused by medication   History of kidney stones    multiple kidney stones -passed some stones and surgery x 3    Hyperlipidemia    Hypertension    a. Difficult to control-->10/2017 Renal Duplex: < 60% bilat RAS; b. 06/2023 24 hr ABM - mean BP 145/58 (range 119-200/44-92).   Left breast mass    PAH (pulmonary artery hypertension) (HCC)    a. 10/2017 Echo: PASP .   RBBB    Reflux    Seasonal allergies    Sleep apnea    wears CPAP nightly   SVD (spontaneous vaginal delivery)    x 3   Past Surgical History:  Procedure Laterality Date   BREAST LUMPECTOMY WITH RADIOACTIVE SEED LOCALIZATION Left 07/19/2018   Procedure: LEFT BREAST LUMPECTOMY WITH RADIOACTIVE SEED LOCALIZATION ERAS PATHWAY;  Surgeon: Griselda Miner, MD;  Location: [facility];  Service: General;  Laterality: Left;   HYSTEROSCOPY WITH D & C N/A 03/22/2017   Procedure: DILATATION AND CURETTAGE /HYSTEROSCOPY;  Surgeon: Tracey Harries, MD;  Location: [facility];  Service: Gynecology;  Laterality: N/A;   lithrotripsy     surgery x 3   right foot surgery     repair tendon   SHOULDER SURGERY     x 2 - right and left    Allergies  Allergies   Allergen Reactions   Iodinated Contrast Media Anaphylaxis and Other (See Comments)   Amlodipine     Swelling/'foggy sensation'   Avelox [Moxifloxacin Hcl]     Weakness and felt bad   Doxazosin     Explosive diarrhea    Hydralazine     anxiety   Iodine    Moxifloxacin Other (See Comments)   Trimethoprim Other (See Comments) and Diarrhea    Extreme diarrhea     History of Present Illness      82 y.o. y/o female with above complex past medical history including difficult to control hypertension, hyperlipidemia, diabetes, stage II chronic kidney disease, obesity, and GERD. She has had multiple medication intolerances in the setting of trying to get her blood pressure down including amlodipine, hydralazine, HCTZ, doxazosin, and lisinopril. In November 2018, she underwent work-up for secondary hypertension was found to have a normal serum aldosterone/plasma renin activity, no significant renal artery stenosis on renal duplex, normal LV function on echo. Stress testing was also undertaken was nonischemic.   In the setting of ongoing elevations of BLOOD pressures with reports of normal pressures at home, she underwent 24-hour blood pressure monitoring in the spring 2024,  which showed labile readings though overall, pressures are mildly elevated.  Recommendation was made spironolactone 25 mg daily though this was not started patient just started on empagliflozin through her PCP and preferred not to add to any medications at once.   At office follow-up in July 25, pressure was elevated 190/96.  Patient agreed to start spironolactone however, shortly after starting, she noticed intermittent weak spells and significant fatigue.  Follow-up lab work showed elevation of BUN/creatinine to 43 and 1.6 respectively on August 2 and the dose was reduced to 12.5 mg daily.  On August 12, BUN/creatinine remain elevated 40/1.55 with potassium of 5.1.  Spironolactone was discontinued.  Since then, she has felt  significantly better.  Her blood pressures at home typically trends in the 120s to 130s.  She does not experience chest pain, dyspnea, palpitations, PND, apnea, dizziness, syncope, edema, or early satiety.  Home Medications    Current Outpatient Medications  Medication Sig Dispense Refill   carvedilol (COREG) 12.5 MG tablet Take 12.5 mg by mouth 2 (two) times daily with a meal.     chlorthalidone (HYGROTON) 25 MG tablet Take 0.5 tablets (12.5 mg total) by mouth daily.     cloNIDine (CATAPRES - DOSED IN MG/24 HR) 0.2 mg/24hr patch Place 0.1 mg onto the skin once a week.     famotidine (PEPCID) 20 MG tablet Take 1 tablet (20 mg total) by mouth 2 (two) times daily. 60 tablet 2   loratadine (CLARITIN) 10 MG tablet Take 10 mg by mouth daily.     losartan (COZAAR) 100 MG tablet Take 100 mg by mouth at bedtime.      Multiple Vitamin (MULTIVITAMIN) capsule Take 1 capsule by mouth daily.      NON FORMULARY Equalactin 2 tabs PO daily     Polyethyl Glycol-Propyl Glycol (SYSTANE ULTRA OP) Place 1 drop into both eyes daily as needed (dry eyes).     pravastatin (PRAVACHOL) 20 MG tablet Take 20 mg by mouth daily.     empagliflozin (JARDIANCE) 10 MG TABS tablet Take by mouth daily. (Patient not taking: Reported on 08/17/2023)     sertraline (ZOLOFT) 50 MG tablet Take 1 tablet (50 mg total) by mouth daily. (Patient not taking: Reported on 04/05/2023) 90 tablet 5   No current facility-administered medications for this visit.     Review of Systems    Has been feeling weak and fatigued and spironolactone.  No feeling much better.  She denies chest pain, palpitations, dyspnea, pnd, orthopnea, n, v, dizziness, syncope, edema, weight gain, or early satiety.  All other systems reviewed and are otherwise negative except as noted above.    Physical Exam    VS:  BP (!) 148/78 (BP Location: Left Arm, Patient Position: Sitting, Cuff Size: Large)   Pulse 64   Ht 5' 1.5" (1.562 m)   Wt 177 lb 12.8 oz (80.6 kg)    SpO2 97%   BMI 33.05 kg/m  , BMI Body mass index is 33.05 kg/m.     GEN: Well nourished, well developed, in no acute distress. HEENT: normal. Neck: Supple, no JVD, carotid bruits, or masses. Cardiac: RRR, no murmurs, rubs, or gallops. No clubbing, cyanosis, edema.  Radials 2+/PT 2+ and equal bilaterally.  Respiratory:  Respirations regular and unlabored, clear to auscultation bilaterally. GI: Soft, nontender, nondistended, BS + x 4. MS: no deformity or atrophy. Skin: warm and dry, no rash. Neuro:  Strength and sensation are intact. Psych: Normal affect.  Accessory Clinical Findings  Lab Results  Component Value Date   WBC 8.6 08/29/2022   HGB 10.8 (L) 08/29/2022   HCT 31.3 (L) 08/29/2022   MCV 96.6 08/29/2022   PLT 210 08/29/2022   Lab Results  Component Value Date   CREATININE 1.55 (H) 07/30/2023   BUN 40 (H) 07/30/2023   NA 144 07/30/2023   K 5.1 07/30/2023   CL 103 07/30/2023   CO2 22 07/30/2023   Lab Results  Component Value Date   ALT 13 08/29/2022   AST 20 08/29/2022   ALKPHOS 79 08/29/2022   BILITOT 1.4 (H) 08/29/2022    Lab Results  Component Value Date   HGBA1C 8.9 (H) 03/15/2023    Assessment & Plan    1.  Labile hypertension: Long history of labile hypertension and whitecoat hypertension.  Recent ambulatory blood pressure monitoring showed a mean blood pressure of 145/58 with a systolic range of 119-200.  Blood pressure was 190 at last office visit she was placed on spironolactone however, this resulted in fatigue, acute kidney injury, and mild elevation of potassium, necessitating discontinuation.  She has felt better since coming off of this and her home blood pressure recordings typically trending 120-130 range (reviewed today).  Blood pressure in the office today is 148/78, which is excellent for an office check.  Follow-up stat basic metabolic panel today as previously advised.  Continue current medications including carvedilol, chlorthalidone, and  losartan.  She will continue to follow blood pressures at home and will follow-up here in 2 months.  As previously noted, if blood pressures start trending higher for office blood pressures and return to prior significant elevations, we will have a low threshold to repeat renal artery duplex and refer to hypertension clinic in [place].  2.  Acute kidney injury/stage II chronic kidney disease: Follow initiation of spironolactone, creatinine rose to 1.6.  She is now off of both spironolactone and empagliflozin.  Follow-up basic metabolic panel today.  3.  Hyperkalemia: Mild at 5.1.  Follow-up lab work today.  4.  Type 2 diabetes mellitus: A1c was 8.9 in March.  Recently on dapagliflozin however, she noted hyperglycemia and this has been discontinued.  She is working with primary care to determine an alternate agent.  5.  Disposition: Follow-up in 6 to 8 weeks.  Follow-up basic metabolic panel today.  Nicolasa Ducking, NP 08/17/2023, 10:25 AM

## 2023-08-18 LAB — BASIC METABOLIC PANEL
BUN/Creatinine Ratio: 33 — ABNORMAL HIGH (ref 12–28)
BUN: 46 mg/dL — ABNORMAL HIGH (ref 8–27)
CO2: 22 mmol/L (ref 20–29)
Calcium: 9.6 mg/dL (ref 8.7–10.3)
Chloride: 100 mmol/L (ref 96–106)
Creatinine, Ser: 1.41 mg/dL — ABNORMAL HIGH (ref 0.57–1.00)
Glucose: 260 mg/dL — ABNORMAL HIGH (ref 70–99)
Potassium: 4.4 mmol/L (ref 3.5–5.2)
Sodium: 139 mmol/L (ref 134–144)
eGFR: 37 mL/min/{1.73_m2} — ABNORMAL LOW (ref >59)

## 2023-09-13 DIAGNOSIS — E1149 Type 2 diabetes mellitus with other diabetic neurological complication: Secondary | ICD-10-CM | POA: Diagnosis not present

## 2023-09-13 DIAGNOSIS — E782 Mixed hyperlipidemia: Secondary | ICD-10-CM | POA: Diagnosis not present

## 2023-09-13 DIAGNOSIS — D539 Nutritional anemia, unspecified: Secondary | ICD-10-CM | POA: Diagnosis not present

## 2023-09-21 DIAGNOSIS — N189 Chronic kidney disease, unspecified: Secondary | ICD-10-CM | POA: Diagnosis not present

## 2023-09-21 DIAGNOSIS — K219 Gastro-esophageal reflux disease without esophagitis: Secondary | ICD-10-CM | POA: Diagnosis not present

## 2023-09-21 DIAGNOSIS — J310 Chronic rhinitis: Secondary | ICD-10-CM | POA: Diagnosis not present

## 2023-09-21 DIAGNOSIS — I10 Essential (primary) hypertension: Secondary | ICD-10-CM | POA: Diagnosis not present

## 2023-09-21 DIAGNOSIS — G319 Degenerative disease of nervous system, unspecified: Secondary | ICD-10-CM | POA: Diagnosis not present

## 2023-09-21 DIAGNOSIS — E782 Mixed hyperlipidemia: Secondary | ICD-10-CM | POA: Diagnosis not present

## 2023-09-21 DIAGNOSIS — F330 Major depressive disorder, recurrent, mild: Secondary | ICD-10-CM | POA: Diagnosis not present

## 2023-09-21 DIAGNOSIS — N1831 Chronic kidney disease, stage 3a: Secondary | ICD-10-CM | POA: Diagnosis not present

## 2023-09-21 DIAGNOSIS — Z23 Encounter for immunization: Secondary | ICD-10-CM | POA: Diagnosis not present

## 2023-09-21 DIAGNOSIS — E1149 Type 2 diabetes mellitus with other diabetic neurological complication: Secondary | ICD-10-CM | POA: Diagnosis not present

## 2023-09-21 DIAGNOSIS — D631 Anemia in chronic kidney disease: Secondary | ICD-10-CM | POA: Diagnosis not present

## 2023-10-06 ENCOUNTER — Other Ambulatory Visit: Payer: Self-pay | Admitting: Internal Medicine

## 2023-10-12 ENCOUNTER — Ambulatory Visit (INDEPENDENT_AMBULATORY_CARE_PROVIDER_SITE_OTHER): Payer: Medicare Other | Admitting: Podiatry

## 2023-10-12 DIAGNOSIS — M79675 Pain in left toe(s): Secondary | ICD-10-CM | POA: Diagnosis not present

## 2023-10-12 DIAGNOSIS — M79674 Pain in right toe(s): Secondary | ICD-10-CM | POA: Diagnosis not present

## 2023-10-12 DIAGNOSIS — E1149 Type 2 diabetes mellitus with other diabetic neurological complication: Secondary | ICD-10-CM

## 2023-10-12 DIAGNOSIS — B351 Tinea unguium: Secondary | ICD-10-CM | POA: Diagnosis not present

## 2023-10-16 NOTE — Progress Notes (Signed)
Chief Complaint  Patient presents with   RFC    Routine foot care. No problems or changes to report.     82 year old female presents the office with her daughter for concerns of thick, elongated nails that she is not able to trim himself.  No swelling redness or drainage.  She also has calluses to her feet that hurt at times.  She states that the last injection gave her the best results that she has had.  No recent injuries or changes otherwise.   PCP: Hamrick, Durward Fortes, MD  Objective: AAO 3, NAD-wearing orthotics DP/PT pulses palpable, CRT less than 3 seconds Nails hypertrophic, dystrophic, elongated, brittle, discolored 10. There is tenderness overlying the nails 1-5 bilaterally. There is no surrounding erythema or drainage along the nail sites. Hyperkeratotic lesions bilateral medial arch on the area of the talar prominence.  No ongoing ulceration, drainage or any signs of infection.  Significant flatfoot is present.  Decreased range of motion of the subtalar joint.  No other areas of pinpoint tenderness noted today. No other areas of tenderness today. No open lesions and no erythema bilaterally. No pain with calf compression, swelling, warmth, erythema.  Assessment: Patient presents with symptomatic onychomycosis, hyperkeratotic lesions; capsulitis  Plan: -Treatment options including alternatives, risks, complications were discussed -Nails sharply debrided 10 without complication/bleeding. -Hyperkeratotic lesion sharply debrided x 2 without any complications or bleeding.  Continue moisturizer, offloading.  Vivi Barrack DPM

## 2023-10-26 ENCOUNTER — Ambulatory Visit: Payer: Medicare Other | Attending: Nurse Practitioner | Admitting: Nurse Practitioner

## 2023-10-26 ENCOUNTER — Encounter: Payer: Self-pay | Admitting: Nurse Practitioner

## 2023-10-26 VITALS — BP 130/72 | HR 56 | Ht 61.5 in | Wt 189.2 lb

## 2023-10-26 DIAGNOSIS — N182 Chronic kidney disease, stage 2 (mild): Secondary | ICD-10-CM | POA: Insufficient documentation

## 2023-10-26 DIAGNOSIS — I5032 Chronic diastolic (congestive) heart failure: Secondary | ICD-10-CM | POA: Insufficient documentation

## 2023-10-26 DIAGNOSIS — E782 Mixed hyperlipidemia: Secondary | ICD-10-CM | POA: Insufficient documentation

## 2023-10-26 DIAGNOSIS — I10 Essential (primary) hypertension: Secondary | ICD-10-CM | POA: Insufficient documentation

## 2023-10-26 DIAGNOSIS — G4733 Obstructive sleep apnea (adult) (pediatric): Secondary | ICD-10-CM | POA: Diagnosis not present

## 2023-10-26 NOTE — Progress Notes (Signed)
Office Visit    Patient Name: Laura Garner Date of Encounter: 10/26/2023  Primary Care Provider:  Ailene Ravel, MD Primary Cardiologist:  Yvonne Kendall, MD  Chief Complaint    82 y.o. female w/ a h/o difficult to control hypertension, hyperlipidemia, type 2 diabetes mellitus, stage II chronic kidney disease, obesity, and GERD, who presents for f/u related to HTN.   Past Medical History  Subjective   Past Medical History:  Diagnosis Date   Anemia    Anxiety    no meds   Arthritis    neck - otc med prn   Chest pain    a. 10/2017 MV: EF 55-65%, no ischemia, infarct.   Diabetes mellitus    type 2   Diastolic dysfunction    a. 10/2017 Echo: EF 55-60%, no rwma, Gr1 DD, mod dil LA, PASP .   GERD (gastroesophageal reflux disease)    Heart palpitations    hx - caused by medication   History of kidney stones    multiple kidney stones -passed some stones and surgery x 3    Hyperlipidemia    Hypertension    a. Difficult to control-->10/2017 Renal Duplex: < 60% bilat RAS; b. 06/2023 24 hr ABM - mean BP 145/58 (range 119-200/44-92).   Left breast mass    PAH (pulmonary artery hypertension) (HCC)    a. 10/2017 Echo: PASP .   RBBB    Reflux    Seasonal allergies    Sleep apnea    wears CPAP nightly   SVD (spontaneous vaginal delivery)    x 3   Past Surgical History:  Procedure Laterality Date   BREAST LUMPECTOMY WITH RADIOACTIVE SEED LOCALIZATION Left 07/19/2018   Procedure: LEFT BREAST LUMPECTOMY WITH RADIOACTIVE SEED LOCALIZATION ERAS PATHWAY;  Surgeon: Griselda Miner, MD;  Location: [facility];  Service: General;  Laterality: Left;   HYSTEROSCOPY WITH D & C N/A 03/22/2017   Procedure: DILATATION AND CURETTAGE /HYSTEROSCOPY;  Surgeon: Tracey Harries, MD;  Location: [facility];  Service: Gynecology;  Laterality: N/A;   lithrotripsy     surgery x 3   right foot surgery     repair tendon   SHOULDER SURGERY     x 2 - right and left     Allergies  Allergies  Allergen Reactions   Iodinated Contrast Media Anaphylaxis and Other (See Comments)   Amlodipine     Swelling/'foggy sensation'   Avelox [Moxifloxacin Hcl]     Weakness and felt bad   Doxazosin     Explosive diarrhea    Hydralazine     anxiety   Iodine    Moxifloxacin Other (See Comments)   Trimethoprim Other (See Comments) and Diarrhea    Extreme diarrhea       History of Present Illness      82 y.o. y/o female with above complex past medical history including difficult to control hypertension, hyperlipidemia, diabetes, stage II chronic kidney disease, obesity, and GERD. She has had multiple medication intolerances in the setting of trying to get her blood pressure down including amlodipine, hydralazine, HCTZ, doxazosin, and lisinopril. In November 2018, she underwent work-up for secondary hypertension was found to have a normal serum aldosterone/plasma renin activity, no significant renal artery stenosis on renal duplex, and normal LV function on echo. Stress testing was also undertaken was nonischemic.   In the setting of ongoing elevations of blood pressures with reports of normal pressures at home, she underwent 24-hour blood pressure  monitoring in the spring 2024, which showed labile readings though overall, pressures were mildly elevated.  She was subsequently placed on spironolactone, which resulted in rising BUN/Creat and K (5.1), and was d/c'd.     Laura Garner was last seen in cardiology clinic in August 2024.  She was moderately hypertensive at 148/78, the reported blood pressures at home in the 120-130 range.  Follow-up labs at that time showed stable renal function and normokalemia.  Since her last visit, Laura Garner has overall felt well.  She notes that in the past 2 weeks or so, she has been snacking more on salty items and after a long day of standing in the kitchen, she noted some increase in baseline level of ankle swelling.  Her weight is up about 3  pounds over the past several months, which she attributes to more frequent snacking.  She does not routinely exercise.  She has chronic, stable dyspnea on exertion.  She denies chest pain, palpitations, PND, orthopnea, dizziness, syncope, edema, or early satiety. Objective  Home Medications    Current Outpatient Medications  Medication Sig Dispense Refill   carvedilol (COREG) 12.5 MG tablet Take 12.5 mg by mouth 2 (two) times daily with a meal.     chlorthalidone (HYGROTON) 25 MG tablet TAKE 1 TABLET (25 MG TOTAL) BY MOUTH DAILY. 90 tablet 0   cloNIDine (CATAPRES - DOSED IN MG/24 HR) 0.2 mg/24hr patch Place 0.1 mg onto the skin once a week.     famotidine (PEPCID) 20 MG tablet Take 1 tablet (20 mg total) by mouth 2 (two) times daily. 60 tablet 2   glipiZIDE (GLUCOTROL) 5 MG tablet Take 5 mg by mouth in the morning.     loratadine (CLARITIN) 10 MG tablet Take 10 mg by mouth daily.     losartan (COZAAR) 100 MG tablet Take 100 mg by mouth at bedtime.      Multiple Vitamin (MULTIVITAMIN) capsule Take 1 capsule by mouth daily.      NON FORMULARY Equalactin 2 tabs PO daily     Polyethyl Glycol-Propyl Glycol (SYSTANE ULTRA OP) Place 1 drop into both eyes daily as needed (dry eyes).     pravastatin (PRAVACHOL) 20 MG tablet Take 20 mg by mouth daily.     No current facility-administered medications for this visit.     Physical Exam    VS:  BP 130/72 (BP Location: Left Arm, Patient Position: Sitting, Cuff Size: Normal)   Pulse (!) 56   Ht 5' 1.5" (1.562 m)   Wt 189 lb 3.2 oz (85.8 kg)   SpO2 98%   BMI 35.17 kg/m  , BMI Body mass index is 35.17 kg/m.       GEN: Well nourished, well developed, in no acute distress. HEENT: normal. Neck: Supple, no JVD, carotid bruits, or masses. Cardiac: RRR, no murmurs, rubs, or gallops. No clubbing, cyanosis, 1+ bilateral somewhat woody ankle edema.  Radials 2+/PT 2+ and equal bilaterally.  Respiratory:  Respirations regular and unlabored, clear to  auscultation bilaterally. GI: Soft, nontender, nondistended, BS + x 4. MS: no deformity or atrophy. Skin: warm and dry, no rash. Neuro:  Strength and sensation are intact. Psych: Normal affect.  Accessory Clinical Findings    ECG personally reviewed by me today - EKG Interpretation Date/Time:  Friday October 26 2023 09:23:15 EST Ventricular Rate:  56 PR Interval:  156 QRS Duration:  130 QT Interval:  460 QTC Calculation: 443 R Axis:   -30  Text Interpretation: Sinus bradycardia  Left axis deviation Right bundle branch block Minimal voltage criteria for LVH, may be normal variant ( R in aVL ) Confirmed by Nicolasa Ducking [phone]) on 10/26/2023 9:28:03 AM  - no acute changes.  Lab Results  Component Value Date   WBC 8.6 08/29/2022   HGB 10.8 (L) 08/29/2022   HCT 31.3 (L) 08/29/2022   MCV 96.6 08/29/2022   PLT 210 08/29/2022   Lab Results  Component Value Date   CREATININE 1.41 (H) 08/17/2023   BUN 46 (H) 08/17/2023   NA 139 08/17/2023   K 4.4 08/17/2023   CL 100 08/17/2023   CO2 22 08/17/2023   Lab Results  Component Value Date   ALT 13 08/29/2022   AST 20 08/29/2022   ALKPHOS 79 08/29/2022   BILITOT 1.4 (H) 08/29/2022    Lab Results  Component Value Date   HGBA1C 8.9 (H) 03/15/2023      Assessment & Plan    1.  Labile hypertension: Long history of labile hypertension and whitecoat hypertension.  Pressures at home continue to trend in the 120s to 130s with an occasional 140.  Pressure today 130/72.  Continue carvedilol, chlorthalidone, clonidine patch, and losartan therapy.  2.  Stage II-III chronic kidney disease: Creatinine stable at 1.41 earlier this year.  She is otherwise followed by primary care and remains on ARB therapy.  3.  Diabetes mellitus: A1c was 8.9 by lab work earlier this year, though she has had more recent labs via primary care and was recently placed on glipizide.  She has had some GI upset since starting glipizide and plans to follow-up with  primary care.  4.  Hyperlipidemia: Followed by her primary care provider.  She remains on pravastatin therapy.  5.  Chronic HFpEF: Mild ankle edema today in the setting of consuming more salty snacks.  We discussed the utility of compression socks, leg elevation, and importance of sodium restriction.  She remains on chlorthalidone therapy.    6.  Disposition: Patient prefers follow-up in 6 months.  She will contact us if her lower extremity swelling or weight worsens in the interim.  Nicolasa Ducking, NP 10/26/2023, 9:44 AM

## 2023-10-26 NOTE — Patient Instructions (Signed)
Medication Instructions:  No changes *If you need a refill on your cardiac medications before your next appointment, please call your pharmacy*   Lab Work: None ordered If you have labs (blood work) drawn today and your tests are completely normal, you will receive your results only by: MyChart Message (if you have MyChart) OR A paper copy in the mail If you have any lab test that is abnormal or we need to change your treatment, we will call you to review the results.   Testing/Procedures: None ordered   Follow-Up: At [facility], you and your health needs are our priority.  As part of our continuing mission to provide you with exceptional heart care, we have created designated Provider Care Teams.  These Care Teams include your primary Cardiologist (physician) and Advanced Practice Providers (APPs -  Physician Assistants and Nurse Practitioners) who all work together to provide you with the care you need, when you need it.  We recommend signing up for the patient portal called "MyChart".  Sign up information is provided on this After Visit Summary.  MyChart is used to connect with patients for Virtual Visits (Telemedicine).  Patients are able to view lab/test results, encounter notes, upcoming appointments, etc.  Non-urgent messages can be sent to your provider as well.   To learn more about what you can do with MyChart, go to [email or web address].    Your next appointment:   6 month(s)  Provider:   Nicolasa Ducking, NP

## 2023-11-18 DIAGNOSIS — H8113 Benign paroxysmal vertigo, bilateral: Secondary | ICD-10-CM | POA: Diagnosis not present

## 2023-11-27 DIAGNOSIS — M47893 Other spondylosis, cervicothoracic region: Secondary | ICD-10-CM | POA: Diagnosis not present

## 2023-11-27 DIAGNOSIS — M9901 Segmental and somatic dysfunction of cervical region: Secondary | ICD-10-CM | POA: Diagnosis not present

## 2023-11-27 DIAGNOSIS — M4721 Other spondylosis with radiculopathy, occipito-atlanto-axial region: Secondary | ICD-10-CM | POA: Diagnosis not present

## 2023-11-27 DIAGNOSIS — M47897 Other spondylosis, lumbosacral region: Secondary | ICD-10-CM | POA: Diagnosis not present

## 2023-11-27 DIAGNOSIS — M9902 Segmental and somatic dysfunction of thoracic region: Secondary | ICD-10-CM | POA: Diagnosis not present

## 2023-11-27 DIAGNOSIS — M9903 Segmental and somatic dysfunction of lumbar region: Secondary | ICD-10-CM | POA: Diagnosis not present

## 2023-12-17 ENCOUNTER — Ambulatory Visit (INDEPENDENT_AMBULATORY_CARE_PROVIDER_SITE_OTHER): Payer: Medicare Other | Admitting: Podiatry

## 2023-12-17 DIAGNOSIS — M79674 Pain in right toe(s): Secondary | ICD-10-CM | POA: Diagnosis not present

## 2023-12-17 DIAGNOSIS — B351 Tinea unguium: Secondary | ICD-10-CM | POA: Diagnosis not present

## 2023-12-17 DIAGNOSIS — M79675 Pain in left toe(s): Secondary | ICD-10-CM

## 2023-12-17 DIAGNOSIS — M779 Enthesopathy, unspecified: Secondary | ICD-10-CM

## 2023-12-17 DIAGNOSIS — Q828 Other specified congenital malformations of skin: Secondary | ICD-10-CM

## 2023-12-17 DIAGNOSIS — E1149 Type 2 diabetes mellitus with other diabetic neurological complication: Secondary | ICD-10-CM | POA: Diagnosis not present

## 2023-12-17 NOTE — Progress Notes (Signed)
Chief Complaint  Patient presents with   Foot Care    Last A1c: 8.9. no anticoag.      82 year old female presents the office with her daughter for concerns of thick, elongated nails that she is not able to trim himself.  No swelling redness or drainage to the toenail sites.  The callus on the left has been doing well but is causing tenderness in the right.  Also states the left ankle has been hurting on the lateral aspect which she has had been previously.  No recent injury or changes otherwise.  PCP: Hamrick, Durward Fortes, MD  Objective: AAO 3, NAD-wearing orthotics DP/PT pulses palpable, CRT less than 3 seconds Nails hypertrophic, dystrophic, elongated, brittle, discolored 10. There is tenderness overlying the nails 1-5 bilaterally. There is no surrounding erythema or drainage along the nail sites. Hyperkeratotic lesions on the right medial arch on the area of the talar prominence.  No ongoing ulceration, drainage or any signs of infection.  Significant flatfoot is present.  Decreased range of motion of the subtalar joint.  Tenderness sinus tarsi area in the left foot without any areas of pinpoint tenderness. No other areas of tenderness today. No open lesions and no erythema bilaterally. No pain with calf compression, swelling, warmth, erythema.  Assessment: Patient presents with symptomatic onychomycosis, hyperkeratotic lesions; capsulitis  Plan: -Treatment options including alternatives, risks, complications were discussed -Nails sharply debrided 10 without complication/bleeding. -Hyperkeratotic lesion sharply debrided x 1 without any complications or bleeding.  Continue moisturizer, offloading. -Offered steroid injection she wants to proceed with this on the left side.  Cleaned skin and a mixture 1 cc Kenalog 10, 0.5 cc of Marcaine plain, 0.5 cc of lidocaine plain was infiltrated without any complications and she tolerated well.  Vivi Barrack DPM

## 2024-01-05 ENCOUNTER — Other Ambulatory Visit: Payer: Self-pay | Admitting: Internal Medicine

## 2024-01-18 ENCOUNTER — Ambulatory Visit (INDEPENDENT_AMBULATORY_CARE_PROVIDER_SITE_OTHER): Payer: Medicare Other | Admitting: Internal Medicine

## 2024-01-18 ENCOUNTER — Encounter: Payer: Self-pay | Admitting: Internal Medicine

## 2024-01-18 VITALS — BP 138/86 | HR 63 | Temp 97.6°F | Ht 61.5 in | Wt 187.4 lb

## 2024-01-18 DIAGNOSIS — G4733 Obstructive sleep apnea (adult) (pediatric): Secondary | ICD-10-CM | POA: Diagnosis not present

## 2024-01-18 NOTE — Progress Notes (Unsigned)
@Patient  ID: Laura Garner, female    DOB: Nov 15, 1941, 83 y.o.   MRN: [id number]   HPI: 83 year old female, never smoked. PMH significant for OSA, allergic rhinitis, HTN, CKD. Patient of Dr. Belia Heman.    Airview download 10/11/22-11/09/22 Usage 30/30 days; 100% > 4 hours Average usage 6 hours 57 mins Pressure 10-20cm h20  Airleaks 16.8L/min (95%) AHI 5.7   Download January 2025 Last 2 weeks shows 100% compliance Air leaks are significantly reduced AHI reduced to approximately 5 This data is reviewed after she adjusted her CPAP mask   TEST/EVENTS :  PSG 01/02/18: mod-severe OSA. RDI 37.4. Autoset 5-20 recommended Compliance 02/25-03/26/19: 30/30 nights.  Compliance 09/03-10/02/19: 30/30 nights. Mean usage 7.2 hrs. Median pressure 19.7 mmHg. AHI 19.4 Compliance 04/18-05/17/20: 30/30 nights. Mean usage: 7.0 hrs. Median pressure 9.3 cm H2O. AHI 10/hr Compliance 10/11/22-11/09/22: 30/30 nights. Mean usage 7 hours. Median pressure 12.9c, h20, AHI 5.7/hour    CC Follow-up assessment for sleep apnea  HPI follow-up/ OSA.  Accompanied by her daughter.  She is doing well today.  She sleeps well most nights. She typically has no issues falling or staying asleep.  Maintained on auto CPAP 10-20cm h20. No significant issues with airleaks in the last 2 weeks energy level is good   She did have complaints of tip of her nose hurting She has seen ENT in the past I did recommend referring back to ENT to evaluate nasal structures  No exacerbation at this time No evidence of heart failure at this time No evidence or signs of infection at this time No respiratory distress No fevers, chills, nausea, vomiting, diarrhea No evidence of lower extremity edema No evidence hemoptysis        Allergies  Allergen Reactions   Iodinated Contrast Media Anaphylaxis and Other (See Comments)   Amlodipine     Swelling/'foggy sensation'   Avelox [Moxifloxacin Hcl]     Weakness and felt bad    Doxazosin     Explosive diarrhea    Hydralazine     anxiety   Iodine    Moxifloxacin Other (See Comments)   Trimethoprim Other (See Comments) and Diarrhea    Extreme diarrhea     Immunization History  Administered Date(s) Administered   Fluad Quad(high Dose 65+) 09/15/2019   Influenza, High Dose Seasonal PF 09/04/2018   Influenza-Unspecified 08/28/2022   PFIZER(Purple Top)SARS-COV-2 Vaccination 08/06/2020, 08/30/2020   Zoster Recombinant(Shingrix) 09/09/2018, 11/19/2018    Past Medical History:  Diagnosis Date   Anemia    Anxiety    no meds   Arthritis    neck - otc med prn   Chest pain    a. 10/2017 MV: EF 55-65%, no ischemia, infarct.   Diabetes mellitus    type 2   Diastolic dysfunction    a. 10/2017 Echo: EF 55-60%, no rwma, Gr1 DD, mod dil LA, PASP .   GERD (gastroesophageal reflux disease)    Heart palpitations    hx - caused by medication   History of kidney stones    multiple kidney stones -passed some stones and surgery x 3    Hyperlipidemia    Hypertension    a. Difficult to control-->10/2017 Renal Duplex: < 60% bilat RAS; b. 06/2023 24 hr ABM - mean BP 145/58 (range 119-200/44-92).   Left breast mass    PAH (pulmonary artery hypertension) (HCC)    a. 10/2017 Echo: PASP .   RBBB    Reflux    Seasonal allergies  Sleep apnea    wears CPAP nightly   SVD (spontaneous vaginal delivery)    x 3    Tobacco History: Social History   Tobacco Use  Smoking Status Never  Smokeless Tobacco Never   Counseling given: Not Answered   Outpatient Medications Prior to Visit  Medication Sig Dispense Refill   carvedilol (COREG) 12.5 MG tablet Take 12.5 mg by mouth 2 (two) times daily with a meal.     chlorthalidone (HYGROTON) 25 MG tablet TAKE 1 TABLET (25 MG TOTAL) BY MOUTH DAILY. 90 tablet 2   cloNIDine (CATAPRES - DOSED IN MG/24 HR) 0.2 mg/24hr patch Place 0.1 mg onto the skin once a week.     famotidine (PEPCID) 20 MG tablet Take 1 tablet (20 mg  total) by mouth 2 (two) times daily. 60 tablet 2   glipiZIDE (GLUCOTROL) 5 MG tablet Take 5 mg by mouth in the morning.     loratadine (CLARITIN) 10 MG tablet Take 10 mg by mouth daily.     losartan (COZAAR) 100 MG tablet Take 100 mg by mouth at bedtime.      Multiple Vitamin (MULTIVITAMIN) capsule Take 1 capsule by mouth daily.      NON FORMULARY Equalactin 2 tabs PO daily     Polyethyl Glycol-Propyl Glycol (SYSTANE ULTRA OP) Place 1 drop into both eyes daily as needed (dry eyes).     pravastatin (PRAVACHOL) 20 MG tablet Take 20 mg by mouth daily.     No facility-administered medications prior to visit.   BP 138/86 (BP Location: Left Arm, Patient Position: Sitting, Cuff Size: Normal)   Pulse 63   Temp 97.6 F (36.4 C) (Temporal)   Ht 5' 1.5" (1.562 m)   Wt 187 lb 6.4 oz (85 kg)   SpO2 98%   BMI 34.84 kg/m        Review of Systems: Gen:  Denies  fever, sweats, chills weight loss  HEENT: Denies blurred vision, double vision, ear pain, eye pain, hearing loss, nose bleeds, sore throat Cardiac:  No dizziness, chest pain or heaviness, chest tightness,edema, No JVD Resp:   No cough, -sputum production, -shortness of breath,-wheezing, -hemoptysis,  Other:  All other systems negative   Physical Examination:   General Appearance: No distress  EYES PERRLA, EOM intact.   NECK Supple, No JVD Pulmonary: normal breath sounds, No wheezing.  CardiovascularNormal S1,S2.  No m/r/g.   Abdomen: Benign, Soft, non-tender. Neurology UE/LE 5/5 strength, no focal deficits Ext pulses intact, cap refill intact ALL OTHER ROS ARE NEGATIVE  Lab Results:  CBC    Component Value Date/Time   WBC 8.6 08/29/2022 1751   RBC 3.24 (L) 08/29/2022 1751   HGB 10.8 (L) 08/29/2022 1751   HCT 31.3 (L) 08/29/2022 1751   PLT 210 08/29/2022 1751   MCV 96.6 08/29/2022 1751   MCH 33.3 08/29/2022 1751   MCHC 34.5 08/29/2022 1751   RDW 12.0 08/29/2022 1751   LYMPHSABS 1.8 08/29/2022 1751   MONOABS 0.7  08/29/2022 1751   EOSABS 0.1 08/29/2022 1751   BASOSABS 0.1 08/29/2022 1751    BMET    Component Value Date/Time   NA 139 08/17/2023 1018   K 4.4 08/17/2023 1018   CL 100 08/17/2023 1018   CO2 22 08/17/2023 1018   GLUCOSE 260 (H) 08/17/2023 1018   GLUCOSE 204 (H) 07/12/2023 1017   BUN 46 (H) 08/17/2023 1018   CREATININE 1.41 (H) 08/17/2023 1018   CALCIUM 9.6 08/17/2023 1018   GFRNONAA 42 (  L) 07/12/2023 1017   GFRAA >60 07/10/2018 0937      Assessment & Plan:  83 year old pleasant white female seen today for follow-up assessment for sleep apnea with a previous AHI of 19   Assessment of OSA Well-controlled on current pressure settings 100% compliance over the last 2 weeks At this time her mask is fitting well CPAP 10-20 Residual AHI approximately 5 Air leaks have improved Continue current prescription no changes to therapy  Patient Instructions  Continue to use CPAP every night, minimum of 4-6 hours a night.  Change equipment every 30 days or as directed by DME.  Wash your tubing with warm soap and water daily, hang to dry. Wash humidifier portion weekly. Use bottled, distilled water and change daily   Be aware of reduced alertness and do not drive or operate heavy machinery if experiencing this or drowsiness.  Exercise encouraged, as tolerated. Encouraged proper weight management.  Important to get eight or more hours of sleep  Limiting the use of the computer and television before bedtime.  Decrease naps during the day, so night time sleep will become enhanced.  Limit caffeine, and sleep deprivation.  HTN, stroke, uncontrolled diabetes and heart failure are potential risk factors.  Risk of untreated sleep apnea including cardiac arrhthymias, stroke, DM, pulm HTN.     Voice hoarseness with nose pain Follow-up ENT as needed   Allergic rhinitis - Continue Claritin 10mg  daily - Add flonase nasal spray daily as needed for rhinitis   Acid reflux Consider PPI if  symptoms are persistent Patient will discuss with PCP   MEDICATION ADJUSTMENTS/LABS AND TESTS ORDERED:    CURRENT MEDICATIONS REVIEWED AT LENGTH WITH PATIENT TODAY   Patient  satisfied with Plan of action and management. All questions answered   Follow up 6 months   I spent a total of 41 minutes reviewing chart data, face-to-face evaluation with the patient, counseling and coordination of care as detailed above.      Lucie Leather, M.D.  [facility]  Medical Director [facility] Medical Director [facility] Cardio-Pulmonary Department

## 2024-01-18 NOTE — Patient Instructions (Signed)
Excellent job with your CPAP! A+ GOLD STAR!!!  Continue prescription of CPAP as prescribed  Avoid Allergens and Irritants Avoid secondhand smoke Avoid SICK contacts Recommend  Masking  when appropriate Recommend Keep up-to-date with vaccinations  If nose is still bothering you recommend ENT assessment If mask is still bothering you recommend referral to [facility] for mask fitting assessment

## 2024-01-21 DIAGNOSIS — E1149 Type 2 diabetes mellitus with other diabetic neurological complication: Secondary | ICD-10-CM | POA: Diagnosis not present

## 2024-01-21 DIAGNOSIS — E782 Mixed hyperlipidemia: Secondary | ICD-10-CM | POA: Diagnosis not present

## 2024-01-21 DIAGNOSIS — N189 Chronic kidney disease, unspecified: Secondary | ICD-10-CM | POA: Diagnosis not present

## 2024-01-25 DIAGNOSIS — F330 Major depressive disorder, recurrent, mild: Secondary | ICD-10-CM | POA: Diagnosis not present

## 2024-01-25 DIAGNOSIS — Z9181 History of falling: Secondary | ICD-10-CM | POA: Diagnosis not present

## 2024-01-25 DIAGNOSIS — Z139 Encounter for screening, unspecified: Secondary | ICD-10-CM | POA: Diagnosis not present

## 2024-01-25 DIAGNOSIS — N1831 Chronic kidney disease, stage 3a: Secondary | ICD-10-CM | POA: Diagnosis not present

## 2024-01-25 DIAGNOSIS — I10 Essential (primary) hypertension: Secondary | ICD-10-CM | POA: Diagnosis not present

## 2024-01-25 DIAGNOSIS — D631 Anemia in chronic kidney disease: Secondary | ICD-10-CM | POA: Diagnosis not present

## 2024-01-25 DIAGNOSIS — N390 Urinary tract infection, site not specified: Secondary | ICD-10-CM | POA: Diagnosis not present

## 2024-01-25 DIAGNOSIS — E782 Mixed hyperlipidemia: Secondary | ICD-10-CM | POA: Diagnosis not present

## 2024-01-25 DIAGNOSIS — N189 Chronic kidney disease, unspecified: Secondary | ICD-10-CM | POA: Diagnosis not present

## 2024-01-25 DIAGNOSIS — G319 Degenerative disease of nervous system, unspecified: Secondary | ICD-10-CM | POA: Diagnosis not present

## 2024-01-25 DIAGNOSIS — E1149 Type 2 diabetes mellitus with other diabetic neurological complication: Secondary | ICD-10-CM | POA: Diagnosis not present

## 2024-02-18 DIAGNOSIS — M9902 Segmental and somatic dysfunction of thoracic region: Secondary | ICD-10-CM | POA: Diagnosis not present

## 2024-02-18 DIAGNOSIS — M4721 Other spondylosis with radiculopathy, occipito-atlanto-axial region: Secondary | ICD-10-CM | POA: Diagnosis not present

## 2024-02-18 DIAGNOSIS — M4723 Other spondylosis with radiculopathy, cervicothoracic region: Secondary | ICD-10-CM | POA: Diagnosis not present

## 2024-02-18 DIAGNOSIS — M9903 Segmental and somatic dysfunction of lumbar region: Secondary | ICD-10-CM | POA: Diagnosis not present

## 2024-02-18 DIAGNOSIS — M47817 Spondylosis without myelopathy or radiculopathy, lumbosacral region: Secondary | ICD-10-CM | POA: Diagnosis not present

## 2024-02-18 DIAGNOSIS — M9901 Segmental and somatic dysfunction of cervical region: Secondary | ICD-10-CM | POA: Diagnosis not present

## 2024-02-21 ENCOUNTER — Ambulatory Visit: Payer: Medicare Other | Admitting: Podiatry

## 2024-02-28 ENCOUNTER — Ambulatory Visit (INDEPENDENT_AMBULATORY_CARE_PROVIDER_SITE_OTHER): Payer: Medicare Other | Admitting: Podiatry

## 2024-02-28 ENCOUNTER — Encounter: Payer: Self-pay | Admitting: Podiatry

## 2024-02-28 DIAGNOSIS — E1149 Type 2 diabetes mellitus with other diabetic neurological complication: Secondary | ICD-10-CM

## 2024-02-28 DIAGNOSIS — M79674 Pain in right toe(s): Secondary | ICD-10-CM

## 2024-02-28 DIAGNOSIS — B351 Tinea unguium: Secondary | ICD-10-CM

## 2024-02-28 DIAGNOSIS — M79675 Pain in left toe(s): Secondary | ICD-10-CM

## 2024-03-01 NOTE — Progress Notes (Signed)
 Chief Complaint  Patient presents with   [facility]    RM#14 [facility] patient has no other concerns at this time.    83 year old female presents the office with her daughter for concerns of thick, elongated nails that she is not able to trim himself.  Otherwise her feet are doing well.  She is not want to calluses trimmed today as they are doing well.  No open lesions or other concerns.  PCP: Ailene Ravel, MD Last seen January 25, 2024  Objective: AAO 3, NAD-wearing orthotics DP/PT pulses palpable, CRT less than 3 seconds Nails hypertrophic, dystrophic, elongated, brittle, discolored 10. There is tenderness overlying the nails 1-5 bilaterally. There is no surrounding erythema or drainage along the nail sites. Hyperkeratotic lesions on the right medial arch on the area of the talar prominence. Significant flatfoot is present.  Decreased range of motion of the subtalar joint.  No other areas of tenderness today. No open lesions and no erythema bilaterally. No pain with calf compression, swelling, warmth, erythema.  Assessment: Patient presents with symptomatic onychomycosis, hyperkeratotic lesions; capsulitis  Plan: -Treatment options including alternatives, risks, complications were discussed -Nails sharply debrided 10 without complication/bleeding.  Vivi Barrack DPM

## 2024-03-03 DIAGNOSIS — M9902 Segmental and somatic dysfunction of thoracic region: Secondary | ICD-10-CM | POA: Diagnosis not present

## 2024-03-03 DIAGNOSIS — M4723 Other spondylosis with radiculopathy, cervicothoracic region: Secondary | ICD-10-CM | POA: Diagnosis not present

## 2024-03-03 DIAGNOSIS — M9901 Segmental and somatic dysfunction of cervical region: Secondary | ICD-10-CM | POA: Diagnosis not present

## 2024-03-03 DIAGNOSIS — M47817 Spondylosis without myelopathy or radiculopathy, lumbosacral region: Secondary | ICD-10-CM | POA: Diagnosis not present

## 2024-03-03 DIAGNOSIS — M4721 Other spondylosis with radiculopathy, occipito-atlanto-axial region: Secondary | ICD-10-CM | POA: Diagnosis not present

## 2024-03-03 DIAGNOSIS — M9903 Segmental and somatic dysfunction of lumbar region: Secondary | ICD-10-CM | POA: Diagnosis not present

## 2024-04-18 ENCOUNTER — Ambulatory Visit: Payer: Medicare Other | Admitting: Nurse Practitioner

## 2024-04-21 DIAGNOSIS — N189 Chronic kidney disease, unspecified: Secondary | ICD-10-CM | POA: Diagnosis not present

## 2024-04-21 DIAGNOSIS — E1149 Type 2 diabetes mellitus with other diabetic neurological complication: Secondary | ICD-10-CM | POA: Diagnosis not present

## 2024-04-21 DIAGNOSIS — E782 Mixed hyperlipidemia: Secondary | ICD-10-CM | POA: Diagnosis not present

## 2024-04-25 DIAGNOSIS — E782 Mixed hyperlipidemia: Secondary | ICD-10-CM | POA: Diagnosis not present

## 2024-04-25 DIAGNOSIS — I10 Essential (primary) hypertension: Secondary | ICD-10-CM | POA: Diagnosis not present

## 2024-04-25 DIAGNOSIS — N189 Chronic kidney disease, unspecified: Secondary | ICD-10-CM | POA: Diagnosis not present

## 2024-04-25 DIAGNOSIS — F330 Major depressive disorder, recurrent, mild: Secondary | ICD-10-CM | POA: Diagnosis not present

## 2024-04-25 DIAGNOSIS — N1831 Chronic kidney disease, stage 3a: Secondary | ICD-10-CM | POA: Diagnosis not present

## 2024-04-25 DIAGNOSIS — D631 Anemia in chronic kidney disease: Secondary | ICD-10-CM | POA: Diagnosis not present

## 2024-04-25 DIAGNOSIS — E1149 Type 2 diabetes mellitus with other diabetic neurological complication: Secondary | ICD-10-CM | POA: Diagnosis not present

## 2024-04-25 DIAGNOSIS — K219 Gastro-esophageal reflux disease without esophagitis: Secondary | ICD-10-CM | POA: Diagnosis not present

## 2024-04-28 DIAGNOSIS — M4723 Other spondylosis with radiculopathy, cervicothoracic region: Secondary | ICD-10-CM | POA: Diagnosis not present

## 2024-04-28 DIAGNOSIS — M9903 Segmental and somatic dysfunction of lumbar region: Secondary | ICD-10-CM | POA: Diagnosis not present

## 2024-04-28 DIAGNOSIS — M47892 Other spondylosis, cervical region: Secondary | ICD-10-CM | POA: Diagnosis not present

## 2024-04-28 DIAGNOSIS — M9902 Segmental and somatic dysfunction of thoracic region: Secondary | ICD-10-CM | POA: Diagnosis not present

## 2024-04-28 DIAGNOSIS — M9901 Segmental and somatic dysfunction of cervical region: Secondary | ICD-10-CM | POA: Diagnosis not present

## 2024-04-28 DIAGNOSIS — M47817 Spondylosis without myelopathy or radiculopathy, lumbosacral region: Secondary | ICD-10-CM | POA: Diagnosis not present

## 2024-05-09 ENCOUNTER — Encounter: Payer: Self-pay | Admitting: Nurse Practitioner

## 2024-05-09 ENCOUNTER — Ambulatory Visit: Attending: Nurse Practitioner | Admitting: Nurse Practitioner

## 2024-05-09 VITALS — BP 124/62 | HR 68 | Resp 17 | Ht 61.0 in | Wt 168.0 lb

## 2024-05-09 DIAGNOSIS — N182 Chronic kidney disease, stage 2 (mild): Secondary | ICD-10-CM | POA: Diagnosis not present

## 2024-05-09 DIAGNOSIS — I10 Essential (primary) hypertension: Secondary | ICD-10-CM | POA: Diagnosis not present

## 2024-05-09 DIAGNOSIS — E785 Hyperlipidemia, unspecified: Secondary | ICD-10-CM | POA: Insufficient documentation

## 2024-05-09 DIAGNOSIS — E119 Type 2 diabetes mellitus without complications: Secondary | ICD-10-CM

## 2024-05-09 DIAGNOSIS — E782 Mixed hyperlipidemia: Secondary | ICD-10-CM

## 2024-05-09 DIAGNOSIS — I5032 Chronic diastolic (congestive) heart failure: Secondary | ICD-10-CM

## 2024-05-09 DIAGNOSIS — E1169 Type 2 diabetes mellitus with other specified complication: Secondary | ICD-10-CM | POA: Diagnosis not present

## 2024-05-09 MED ORDER — CLONIDINE 0.1 MG/24HR TD PTWK: 0.1000 mg | MEDICATED_PATCH | TRANSDERMAL | Status: DC

## 2024-05-09 NOTE — Patient Instructions (Signed)
 Medication Instructions:  Your physician recommends that you continue on your current medications as directed. Please refer to the Current Medication list given to you today.  *If you need a refill on your cardiac medications before your next appointment, please call your pharmacy*  Lab Work: No labs ordered today  If you have labs (blood work) drawn today and your tests are completely normal, you will receive your results only by: MyChart Message (if you have MyChart) OR A paper copy in the mail If you have any lab test that is abnormal or we need to change your treatment, we will call you to review the results.  Testing/Procedures: No test ordered today   Follow-Up: At [facility], you and your health needs are our priority.  As part of our continuing mission to provide you with exceptional heart care, our providers are all part of one team.  This team includes your primary Cardiologist (physician) and Advanced Practice Providers or APPs (Physician Assistants and Nurse Practitioners) who all work together to provide you with the care you need, when you need it.  Your next appointment:   6 month(s)  Provider:   You may see Sammy Crisp, MD or one of the following Advanced Practice Providers on your designated Care Team:   Laneta Pintos, NP Gildardo Labrador, PA-C Varney Gentleman, PA-C Cadence Rewey, PA-C Ronald Cockayne, NP Morey Ar, NP    We recommend signing up for the patient portal called "MyChart".  Sign up information is provided on this After Visit Summary.  MyChart is used to connect with patients for Virtual Visits (Telemedicine).  Patients are able to view lab/test results, encounter notes, upcoming appointments, etc.  Non-urgent messages can be sent to your provider as well.   To learn more about what you can do with MyChart, go to [email or web address].

## 2024-05-09 NOTE — Progress Notes (Signed)
 Office Visit    Patient Name: Laura Garner Date of Encounter: 05/09/2024  Primary Care Provider:  Annette Barters, MD Primary Cardiologist:  Laura Crisp, MD  Chief Complaint    83 y.o. female with history of difficult to control hypertension, hyperlipidemia, type 2 diabetes mellitus, stage II-III chronic kidney disease, obesity, and GERD, who presents for hypertension follow-up.  Past Medical History   Subjective   Past Medical History:  Diagnosis Date   Anemia    Anxiety    no meds   Arthritis    neck - otc med prn   Chest pain    a. 10/2017 MV: EF 55-65%, no ischemia, infarct.   Diabetes mellitus    type 2   Diastolic dysfunction    a. 10/2017 Echo: EF 55-60%, no rwma, Gr1 DD, mod dil LA, PASP .   GERD (gastroesophageal reflux disease)    Heart palpitations    hx - caused by medication   History of kidney stones    multiple kidney stones -passed some stones and surgery x 3    Hyperlipidemia    Hypertension    a. Difficult to control-->10/2017 Renal Duplex: < 60% bilat RAS; b. 06/2023 24 hr ABM - mean BP 145/58 (range 119-200/44-92).   Left breast mass    PAH (pulmonary artery hypertension) (HCC)    a. 10/2017 Echo: PASP .   RBBB    Reflux    Seasonal allergies    Sleep apnea    wears CPAP nightly   SVD (spontaneous vaginal delivery)    x 3   Past Surgical History:  Procedure Laterality Date   BREAST LUMPECTOMY WITH RADIOACTIVE SEED LOCALIZATION Left 07/19/2018   Procedure: LEFT BREAST LUMPECTOMY WITH RADIOACTIVE SEED LOCALIZATION ERAS PATHWAY;  Surgeon: Laura Chandler, MD;  Location: [facility];  Service: General;  Laterality: Left;   CATARACT EXTRACTION     HYSTEROSCOPY WITH D & C N/A 03/22/2017   Procedure: DILATATION AND CURETTAGE /HYSTEROSCOPY;  Surgeon: Laura Friar, MD;  Location: [facility];  Service: Gynecology;  Laterality: N/A;   lithrotripsy     surgery x 3   right foot surgery     repair tendon   SHOULDER  SURGERY     x 2 - right and left    Allergies  Allergies  Allergen Reactions   Iodinated Contrast Media Anaphylaxis and Other (See Comments)   Amlodipine     Swelling/'foggy sensation'   Avelox [Moxifloxacin Hcl]     Weakness and felt bad   Doxazosin     Explosive diarrhea    Hydralazine     anxiety   Iodine    Moxifloxacin Other (See Comments)   Trimethoprim Other (See Comments) and Diarrhea    Extreme diarrhea        History of Present Illness      83 y.o. y/o female with above complex past medical history including difficult to control hypertension, hyperlipidemia, diabetes, stage II chronic kidney disease, obesity, and GERD. She has had multiple medication intolerances in the setting of trying to get her blood pressure down including amlodipine, hydralazine, HCTZ, doxazosin, and lisinopril. In November 2018, she underwent work-up for secondary hypertension was found to have a normal serum aldosterone/plasma renin activity, no significant renal artery stenosis on renal duplex, and normal LV function on echo. Stress testing was also undertaken was nonischemic.   In the setting of ongoing elevations of blood pressures with reports of normal pressures at home,  she underwent 24-hour blood pressure monitoring in the spring 2024, which showed labile readings though overall, pressures were mildly elevated.  She was subsequently placed on spironolactone , which resulted in rising BUN/Creat and K (5.1), and was d/c'd.     Laura Garner was last seen in cardiology clinic in November 2024, at which time she was doing well and blood pressure was stable.  She had mild ankle edema with plan to use compression socks.  Since her last visit, she has been using ozempic and tolerating well.  Her Wt is down 23 pounds since November 2024.  With this, her blood pressure has been much better regulated at home, often trending in the 120s to 130s.  Likewise, recent A1c was down to 6.8.  She has been feeling well  and is very proud of her progress.  She remains reasonably active.  She denies chest pain, dyspnea, palpitations, PND, orthopnea, dizziness, syncope, or early satiety.  She has been wearing compression socks and notes only mild ankle swelling at the end of the day, which is a significant improvement. Objective   Home Medications    Current Outpatient Medications  Medication Sig Dispense Refill   carvedilol (COREG) 12.5 MG tablet Take 12.5 mg by mouth 2 (two) times daily with a meal.     chlorthalidone  (HYGROTON ) 25 MG tablet TAKE 1 TABLET (25 MG TOTAL) BY MOUTH DAILY. 90 tablet 2   famotidine  (PEPCID ) 20 MG tablet Take 1 tablet (20 mg total) by mouth 2 (two) times daily. 60 tablet 2   loratadine (CLARITIN) 10 MG tablet Take 10 mg by mouth daily.     losartan (COZAAR) 100 MG tablet Take 100 mg by mouth at bedtime.      Multiple Vitamin (MULTIVITAMIN) capsule Take 1 capsule by mouth daily.      NON FORMULARY Equalactin 2 tabs PO daily     OZEMPIC, 0.25 OR 0.5 MG/DOSE, 2 MG/3ML SOPN Inject into the skin.     Polyethyl Glycol-Propyl Glycol (SYSTANE ULTRA OP) Place 1 drop into both eyes daily as needed (dry eyes).     pravastatin (PRAVACHOL) 20 MG tablet Take 20 mg by mouth daily.     cloNIDine  (CATAPRES  - DOSED IN MG/24 HR) 0.1 mg/24hr patch Place 1 patch (0.1 mg total) onto the skin once a week.     No current facility-administered medications for this visit.     Physical Exam    VS:  BP 124/62 (BP Location: Left Arm, Patient Position: Sitting, Cuff Size: Large)   Pulse 68   Resp 17   Ht 5\' 1"  (1.549 m)   Wt 168 lb (76.2 kg)   SpO2 97%   BMI 31.74 kg/m  , BMI Body mass index is 31.74 kg/m.       GEN: Well nourished, well developed, in no acute distress. HEENT: normal. Neck: Supple, no JVD, carotid bruits, or masses. Cardiac: RRR, no murmurs, rubs, or gallops. No clubbing, cyanosis, trace bilateral ankle edema.  Radials 2+/PT 2+ and equal bilaterally.  Respiratory:  Respirations  regular and unlabored, clear to auscultation bilaterally. GI: Soft, nontender, nondistended, BS + x 4. MS: no deformity or atrophy. Skin: warm and dry, no rash. Neuro:  Strength and sensation are intact. Psych: Normal affect.  Accessory Clinical Findings    ECG personally reviewed by me today - EKG Interpretation Date/Time:  Friday May 09 2024 09:30:29 EDT Ventricular Rate:  65 PR Interval:  160 QRS Duration:  130 QT Interval:  420 QTC Calculation:  436 R Axis:   -28  Text Interpretation: Normal sinus rhythm with sinus arrhythmia Right bundle branch block Minimal voltage criteria for LVH, may be normal variant ( R in aVL ) Confirmed by Laneta Pintos [phone]) on 05/09/2024 9:46:29 AM   - no acute changes.  Recent [facility] labs dated Apr 21, 2024:  Hemoglobin 10.7, hematocrit 33.2, WBC 7, platelets 194 Sodium 142, potassium 3.9, chloride 104, CO2 20, BUN 43 creatinine 1.17, glucose 158 Total bilirubin 0.7, alkaline phosphatase 58, AST 17, ALT 10 Total protein 6.7, calcium 10.5, albumin 4.4 Hemoglobin A1c 6.8 Total cholesterol 204, triglycerides 186, HDL 44, LDL 127    Assessment & Plan    1.  Labile hypertension: Much improved in the setting of 23 pound weight loss on Ozempic therapy.  Pressure today is 124/62.  Her records from home show fairly consistent systolics trending in the 120s to 130s which is a significant improvement for her.  She remains on carvedilol, chlorthalidone , clonidine , and losartan.  We discussed today that with ongoing weight loss, it is likely that we may need to scale back her antihypertensive regimen.  When that time comes, I would look to wean off clonidine  first.  2.  Stage II-III chronic kidney disease: Recent creatinine was 1.17 on May 5.  She remains on ARB therapy.  3.  Type 2 diabetes mellitus: Significant improvement on Ozempic therapy with A1c of 6.8.  Down 23 pounds since November.  4.  Hyperlipidemia: She was on pravastatin therapy.  LDL 127.   I suspect this will come down further with additional weight loss.  5.  Chronic HFpEF: Heart rate and blood pressure stable.  Trace ankle edema today.  She been wearing compression stockings and notes that she significantly adjusted her diet and reduce her sodium intake.  Continue current medications.  6.  Morbid obesity: Now on Ozempic and tolerating well.  She has also made significant changes to her diet.  She is down 23 pounds since November.  I congratulated her on this.  7.  Disposition: Follow-up in 6 months or sooner if necessary.  Laneta Pintos, NP 05/09/2024, 10:35 AM

## 2024-06-05 ENCOUNTER — Ambulatory Visit (INDEPENDENT_AMBULATORY_CARE_PROVIDER_SITE_OTHER): Admitting: Podiatry

## 2024-06-05 DIAGNOSIS — E1149 Type 2 diabetes mellitus with other diabetic neurological complication: Secondary | ICD-10-CM

## 2024-06-05 DIAGNOSIS — M79674 Pain in right toe(s): Secondary | ICD-10-CM

## 2024-06-05 DIAGNOSIS — B351 Tinea unguium: Secondary | ICD-10-CM

## 2024-06-05 DIAGNOSIS — M79675 Pain in left toe(s): Secondary | ICD-10-CM

## 2024-06-05 NOTE — Patient Instructions (Signed)
 For topical medications I like ASPERCREME WITH LIDOCAINE  and CAPSAICIN.   --  Neuropathic Pain Neuropathic pain is pain caused by damage to the nerves that are responsible for certain sensations in your body (sensory nerves). Neuropathic pain can make you more sensitive to pain. Even a minor sensation can feel very painful. This is usually a long-term (chronic) condition that can be difficult to treat. The type of pain differs from person to person. It may: Start suddenly (acute), or it may develop slowly and become chronic. Come and go as damaged nerves heal, or it may stay at the same level for years. Cause emotional distress, loss of sleep, and a lower quality of life. What are the causes? The most common cause of this condition is diabetes. Many other diseases and conditions can also cause neuropathic pain. Causes of neuropathic pain can be classified as: Toxic. This is caused by medicines and chemicals. The most common causes of toxic neuropathic pain is damage from medicines that kill cancer cells (chemotherapy) or alcohol abuse. Metabolic. This can be caused by: Diabetes. Lack of vitamins like B12. Traumatic. Any injury that cuts, crushes, or stretches a nerve can cause damage and pain. Compression-related. If a sensory nerve gets trapped or compressed for a long period of time, the blood supply to the nerve can be cut off. Vascular. Many blood vessel diseases can cause neuropathic pain by decreasing blood supply and oxygen  to nerves. Autoimmune. This type of pain results from diseases in which the body's defense system (immune system) mistakenly attacks sensory nerves. Examples of autoimmune diseases that can cause neuropathic pain include lupus and multiple sclerosis. Infectious. Many types of viral infections can damage sensory nerves and cause pain. Shingles infection is a common cause of this type of pain. Inherited. Neuropathic pain can be a symptom of many diseases that are passed  down through families (genetic). What increases the risk? You are more likely to develop this condition if: You have diabetes. You smoke. You drink too much alcohol. You are taking certain medicines, including chemotherapy or medicines that treat immune system disorders. What are the signs or symptoms? The main symptom is pain. Neuropathic pain is often described as: Burning. Shock-like. Stinging. Hot or cold. Itching. How is this diagnosed? No single test can diagnose neuropathic pain. It is diagnosed based on: A physical exam and your symptoms. Your health care provider will ask you about your pain. You may be asked to use a pain scale to describe how bad your pain is. Tests. These may be done to see if you have a cause and location of any nerve damage. They include: Nerve conduction studies and electromyography to test how well nerve signals travel through your nerves and muscles (electrodiagnostic testing). Skin biopsy to evaluate for small fiber neuropathy. Imaging studies, such as: X-rays. CT scan. MRI. How is this treated? Treatment for neuropathic pain may change over time. You may need to try different treatment options or a combination of treatments. Some options include: Treating the underlying cause of the neuropathy, such as diabetes, kidney disease, or vitamin deficiencies. Stopping medicines that can cause neuropathy, such as chemotherapy. Medicine to relieve pain. Medicines may include: Prescription or over-the-counter pain medicine. Anti-seizure medicine. Antidepressant medicines. Pain-relieving patches or creams that are applied to painful areas of skin. A medicine to numb the area (local anesthetic), which can be injected as a nerve block. Transcutaneous nerve stimulation. This uses electrical currents to block painful nerve signals. The treatment is painless. Alternative treatments,  such as: Acupuncture. Meditation. Massage. Occupational or physical  therapy. Pain management programs. Counseling. Follow these instructions at home: Medicines  Take over-the-counter and prescription medicines only as told by your health care provider. Ask your health care provider if the medicine prescribed to you: Requires you to avoid driving or using machinery. Can cause constipation. You may need to take these actions to prevent or treat constipation: Drink enough fluid to keep your urine pale yellow. Take over-the-counter or prescription medicines. Eat foods that are high in fiber, such as beans, whole grains, and fresh fruits and vegetables. Limit foods that are high in fat and processed sugars, such as fried or sweet foods. Lifestyle  Have a good support system at home. Consider joining a chronic pain support group. Do not use any products that contain nicotine or tobacco. These products include cigarettes, chewing tobacco, and vaping devices, such as e-cigarettes. If you need help quitting, ask your health care provider. Do not drink alcohol. General instructions Learn as much as you can about your condition. Work closely with all your health care providers to find the treatment plan that works best for you. Ask your health care provider what activities are safe for you. Keep all follow-up visits. This is important. Contact a health care provider if: Your pain treatments are not working. You are having side effects from your medicines. You are struggling with tiredness (fatigue), mood changes, depression, or anxiety. Get help right away if: You have thoughts of hurting yourself. Get help right away if you feel like you may hurt yourself or others, or have thoughts about taking your own life. Go to your nearest emergency room or: Call 911. Call the National Suicide Prevention Lifeline at 252-363-3995 or 988. This is open 24 hours a day. Text the Crisis Text Line at 4081294261. Summary Neuropathic pain is pain caused by damage to the nerves  that are responsible for certain sensations in your body (sensory nerves). Neuropathic pain may come and go as damaged nerves heal, or it may stay at the same level for years. Neuropathic pain is usually a long-term condition that can be difficult to treat. Consider joining a chronic pain support group. This information is not intended to replace advice given to you by your health care provider. Make sure you discuss any questions you have with your health care provider. Document Revised: 08/01/2021 Document Reviewed: 08/01/2021 Elsevier Patient Education  2024 [employer].

## 2024-06-07 NOTE — Progress Notes (Signed)
 Chief Complaint  Patient presents with   [facility]    RM#13 [facility] patient has no concerns just routine nail care.    83 year old female presents the office with her daughter for concerns of thick, elongated nails that she is not able to trim himself.  States that the calluses have been doing better and she thinks this with the inserts are pushing up too much pressure on her feet.  She does not report any open lesions.  No swelling redness or drainage.  She does not feel that she needs injections today as her foot pain has been relatively controlled.   PCP: Stephanie Charlene CROME, MD Last seen January 25, 2024  Objective: AAO 3, NAD-wearing orthotics DP/PT pulses palpable, CRT less than 3 seconds Nails hypertrophic, dystrophic, elongated, brittle, discolored 10. There is tenderness overlying the nails 1-5 bilaterally. There is no surrounding erythema or drainage along the nail sites. There is minimal hyperkeratotic lesions on the right medial arch on the area of the talar prominence. Significant flatfoot is present.  Decreased range of motion of the subtalar joint.  No other areas of tenderness today. No pain with calf compression, swelling, warmth, erythema.  Assessment: Patient presents with symptomatic onychomycosis  Plan: -Treatment options including alternatives, risks, complications were discussed -Nails sharply debrided 10 without complication/bleeding. - Calluses are doing much better.  Moisturizer.  Discussed and offered to modify the inserts  Donnice JONELLE Fees DPM

## 2024-07-16 DIAGNOSIS — H524 Presbyopia: Secondary | ICD-10-CM | POA: Diagnosis not present

## 2024-07-16 DIAGNOSIS — E119 Type 2 diabetes mellitus without complications: Secondary | ICD-10-CM | POA: Diagnosis not present

## 2024-08-07 ENCOUNTER — Ambulatory Visit: Admitting: Podiatry

## 2024-08-21 DIAGNOSIS — M9904 Segmental and somatic dysfunction of sacral region: Secondary | ICD-10-CM | POA: Diagnosis not present

## 2024-08-21 DIAGNOSIS — M9901 Segmental and somatic dysfunction of cervical region: Secondary | ICD-10-CM | POA: Diagnosis not present

## 2024-08-21 DIAGNOSIS — M9903 Segmental and somatic dysfunction of lumbar region: Secondary | ICD-10-CM | POA: Diagnosis not present

## 2024-08-21 DIAGNOSIS — M47817 Spondylosis without myelopathy or radiculopathy, lumbosacral region: Secondary | ICD-10-CM | POA: Diagnosis not present

## 2024-08-21 DIAGNOSIS — M9902 Segmental and somatic dysfunction of thoracic region: Secondary | ICD-10-CM | POA: Diagnosis not present

## 2024-08-21 DIAGNOSIS — M4722 Other spondylosis with radiculopathy, cervical region: Secondary | ICD-10-CM | POA: Diagnosis not present

## 2024-08-21 DIAGNOSIS — M4724 Other spondylosis with radiculopathy, thoracic region: Secondary | ICD-10-CM | POA: Diagnosis not present

## 2024-09-04 ENCOUNTER — Ambulatory Visit: Admitting: Podiatry

## 2024-09-15 DIAGNOSIS — N189 Chronic kidney disease, unspecified: Secondary | ICD-10-CM | POA: Diagnosis not present

## 2024-09-15 DIAGNOSIS — E782 Mixed hyperlipidemia: Secondary | ICD-10-CM | POA: Diagnosis not present

## 2024-09-15 DIAGNOSIS — E1149 Type 2 diabetes mellitus with other diabetic neurological complication: Secondary | ICD-10-CM | POA: Diagnosis not present

## 2024-09-22 DIAGNOSIS — I10 Essential (primary) hypertension: Secondary | ICD-10-CM | POA: Diagnosis not present

## 2024-09-22 DIAGNOSIS — E1149 Type 2 diabetes mellitus with other diabetic neurological complication: Secondary | ICD-10-CM | POA: Diagnosis not present

## 2024-09-22 DIAGNOSIS — F330 Major depressive disorder, recurrent, mild: Secondary | ICD-10-CM | POA: Diagnosis not present

## 2024-09-22 DIAGNOSIS — N189 Chronic kidney disease, unspecified: Secondary | ICD-10-CM | POA: Diagnosis not present

## 2024-09-22 DIAGNOSIS — Z23 Encounter for immunization: Secondary | ICD-10-CM | POA: Diagnosis not present

## 2024-09-22 DIAGNOSIS — M79641 Pain in right hand: Secondary | ICD-10-CM | POA: Diagnosis not present

## 2024-09-22 DIAGNOSIS — N1831 Chronic kidney disease, stage 3a: Secondary | ICD-10-CM | POA: Diagnosis not present

## 2024-09-22 DIAGNOSIS — D631 Anemia in chronic kidney disease: Secondary | ICD-10-CM | POA: Diagnosis not present

## 2024-09-22 DIAGNOSIS — K219 Gastro-esophageal reflux disease without esophagitis: Secondary | ICD-10-CM | POA: Diagnosis not present

## 2024-09-22 DIAGNOSIS — E782 Mixed hyperlipidemia: Secondary | ICD-10-CM | POA: Diagnosis not present

## 2024-09-24 ENCOUNTER — Other Ambulatory Visit: Payer: Self-pay | Admitting: Internal Medicine

## 2024-09-25 ENCOUNTER — Encounter: Payer: Self-pay | Admitting: Podiatry

## 2024-09-25 ENCOUNTER — Ambulatory Visit: Admitting: Podiatry

## 2024-09-25 VITALS — Ht 61.0 in | Wt 168.0 lb

## 2024-09-25 DIAGNOSIS — M79675 Pain in left toe(s): Secondary | ICD-10-CM

## 2024-09-25 DIAGNOSIS — M79674 Pain in right toe(s): Secondary | ICD-10-CM

## 2024-09-25 DIAGNOSIS — M7751 Other enthesopathy of right foot: Secondary | ICD-10-CM | POA: Diagnosis not present

## 2024-09-25 DIAGNOSIS — E1149 Type 2 diabetes mellitus with other diabetic neurological complication: Secondary | ICD-10-CM | POA: Diagnosis not present

## 2024-09-25 DIAGNOSIS — B351 Tinea unguium: Secondary | ICD-10-CM

## 2024-09-25 DIAGNOSIS — M7752 Other enthesopathy of left foot: Secondary | ICD-10-CM

## 2024-09-25 MED ORDER — TRIAMCINOLONE ACETONIDE 10 MG/ML IJ SUSP
5.0000 mg | Freq: Once | INTRAMUSCULAR | Status: AC
Start: 2024-09-25 — End: 2024-09-25
  Administered 2024-09-25: 5 mg via INTRAMUSCULAR

## 2024-09-25 NOTE — Patient Instructions (Signed)

## 2024-09-25 NOTE — Progress Notes (Signed)
 Chief Complaint  Patient presents with   Nail Problem    Pt is here for [facility] and injection into both feet for pain.   83 year old female presents the office with her daughter for concerns of thick, elongated nails that she is not able to trim himself.  No swelling redness or drainage.  She is also asking for steroid injections today as they are starting to become more uncomfortable. No recent injuries or changes.   PCP: Hamrick, Charlene CROME, MD  Objective: AAO 3, NAD-wearing orthotics DP/PT pulses palpable, CRT less than 3 seconds Nails hypertrophic, dystrophic, elongated, brittle, discolored 10. There is tenderness overlying the nails 1-5 bilaterally. There is no surrounding erythema or drainage along the nail sites. No other areas of tenderness today. No open lesions and no erythema bilaterally. No pain with calf compression, swelling, warmth, erythema.  Assessment: Patient presents with symptomatic onychomycosis,capsulitis  Plan: -Treatment options including alternatives, risks, complications were discussed -Nails sharply debrided 10 without complication/bleeding. -Steroid injections performed bilaterally.  I cleaned the skin.  I then infiltrated a mixture of 1 mL of Kenalog  10, 0.5 mL of lidocaine  plain, 0.5 mL of Marcaine  plain into the sinus tarsi bilaterally.  Postinjection care discussed.  She tolerated well.  Continue supportive shoes.  Laura Garner DPM

## 2024-09-29 DIAGNOSIS — M4722 Other spondylosis with radiculopathy, cervical region: Secondary | ICD-10-CM | POA: Diagnosis not present

## 2024-09-29 DIAGNOSIS — M9902 Segmental and somatic dysfunction of thoracic region: Secondary | ICD-10-CM | POA: Diagnosis not present

## 2024-09-29 DIAGNOSIS — M4723 Other spondylosis with radiculopathy, cervicothoracic region: Secondary | ICD-10-CM | POA: Diagnosis not present

## 2024-09-29 DIAGNOSIS — M9901 Segmental and somatic dysfunction of cervical region: Secondary | ICD-10-CM | POA: Diagnosis not present

## 2024-09-29 DIAGNOSIS — M9904 Segmental and somatic dysfunction of sacral region: Secondary | ICD-10-CM | POA: Diagnosis not present

## 2024-09-29 DIAGNOSIS — M47817 Spondylosis without myelopathy or radiculopathy, lumbosacral region: Secondary | ICD-10-CM | POA: Diagnosis not present

## 2024-10-27 DIAGNOSIS — M47812 Spondylosis without myelopathy or radiculopathy, cervical region: Secondary | ICD-10-CM | POA: Diagnosis not present

## 2024-10-27 DIAGNOSIS — M25511 Pain in right shoulder: Secondary | ICD-10-CM | POA: Diagnosis not present

## 2024-10-27 DIAGNOSIS — M542 Cervicalgia: Secondary | ICD-10-CM | POA: Diagnosis not present

## 2024-11-24 DIAGNOSIS — M4722 Other spondylosis with radiculopathy, cervical region: Secondary | ICD-10-CM | POA: Diagnosis not present

## 2024-11-24 DIAGNOSIS — M4723 Other spondylosis with radiculopathy, cervicothoracic region: Secondary | ICD-10-CM | POA: Diagnosis not present

## 2024-11-24 DIAGNOSIS — M9903 Segmental and somatic dysfunction of lumbar region: Secondary | ICD-10-CM | POA: Diagnosis not present

## 2024-11-24 DIAGNOSIS — M9901 Segmental and somatic dysfunction of cervical region: Secondary | ICD-10-CM | POA: Diagnosis not present

## 2024-11-24 DIAGNOSIS — M9902 Segmental and somatic dysfunction of thoracic region: Secondary | ICD-10-CM | POA: Diagnosis not present

## 2024-11-24 DIAGNOSIS — M5459 Other low back pain: Secondary | ICD-10-CM | POA: Diagnosis not present

## 2024-12-26 ENCOUNTER — Ambulatory Visit: Admitting: Podiatry

## 2024-12-26 DIAGNOSIS — M79675 Pain in left toe(s): Secondary | ICD-10-CM | POA: Diagnosis not present

## 2024-12-26 DIAGNOSIS — E1149 Type 2 diabetes mellitus with other diabetic neurological complication: Secondary | ICD-10-CM

## 2024-12-26 DIAGNOSIS — B351 Tinea unguium: Secondary | ICD-10-CM

## 2024-12-26 DIAGNOSIS — M79674 Pain in right toe(s): Secondary | ICD-10-CM

## 2024-12-26 NOTE — Progress Notes (Signed)
" °  Chief Complaint  Patient presents with   [facility]    NIDDM patient with an A1c of 6.5 presents today for a [facility] patient denies any tingling or numbness sensation.    84 year old female presents the office with her daughter for concerns of thick, elongated nails that she is not able to trim himself.  No swelling redness or drainage.   Injections have done well no pain from that area today.  PCP: Hamrick, Charlene CROME, MD  Objective: AAO 3, NAD-wearing orthotics DP/PT pulses palpable, CRT less than 3 seconds Nails hypertrophic, dystrophic, elongated, brittle, discolored 10. There is tenderness overlying the nails 1-5 bilaterally. There is no surrounding erythema or drainage along the nail sites. No other areas of tenderness today. No open lesions and no erythema bilaterally. No pain with calf compression, swelling, warmth, erythema.  Assessment: Patient presents with symptomatic onychomycosis,capsulitis  Plan: -Treatment options including alternatives, risks, complications were discussed -Nails sharply debrided 10 without complication/bleeding.   Donnice JONELLE Fees DPM "

## 2025-01-01 ENCOUNTER — Encounter: Payer: Self-pay | Admitting: *Deleted

## 2025-01-09 ENCOUNTER — Encounter: Payer: Self-pay | Admitting: Nurse Practitioner

## 2025-01-09 ENCOUNTER — Ambulatory Visit: Attending: Nurse Practitioner | Admitting: Nurse Practitioner

## 2025-01-09 VITALS — BP 138/60 | HR 69 | Ht 61.5 in | Wt 154.1 lb

## 2025-01-09 DIAGNOSIS — I10 Essential (primary) hypertension: Secondary | ICD-10-CM | POA: Diagnosis not present

## 2025-01-09 DIAGNOSIS — G4733 Obstructive sleep apnea (adult) (pediatric): Secondary | ICD-10-CM | POA: Diagnosis present

## 2025-01-09 DIAGNOSIS — I5032 Chronic diastolic (congestive) heart failure: Secondary | ICD-10-CM | POA: Insufficient documentation

## 2025-01-09 DIAGNOSIS — E119 Type 2 diabetes mellitus without complications: Secondary | ICD-10-CM | POA: Insufficient documentation

## 2025-01-09 DIAGNOSIS — E782 Mixed hyperlipidemia: Secondary | ICD-10-CM | POA: Insufficient documentation

## 2025-01-09 DIAGNOSIS — N182 Chronic kidney disease, stage 2 (mild): Secondary | ICD-10-CM | POA: Insufficient documentation

## 2025-01-09 NOTE — Progress Notes (Signed)
 "    Office Visit    Patient Name: Laura Garner Date of Encounter: 01/09/2025  Primary Care Provider:  Stephanie Charlene CROME, MD Primary Cardiologist:  Laura Hanson, MD    Chief Complaint    84 y.o. female with history of difficult to control hypertension, hyperlipidemia, type 2 diabetes mellitus, stage II-III chronic kidney disease, obesity, and GERD, who presents for hypertension follow-up.   Past Medical History   Subjective   Past Medical History:  Diagnosis Date   Anemia    Anxiety    no meds   Arthritis    neck - otc med prn   Chest pain    a. 10/2017 MV: EF 55-65%, no ischemia, infarct.   Diabetes mellitus    type 2   Diastolic dysfunction    a. 10/2017 Echo: EF 55-60%, no rwma, Gr1 DD, mod dil LA, PASP .   GERD (gastroesophageal reflux disease)    Heart palpitations    hx - caused by medication   History of kidney stones    multiple kidney stones -passed some stones and surgery x 3    Hyperlipidemia    Hypertension    a. Difficult to control-->10/2017 Renal Duplex: < 60% bilat RAS; b. 06/2023 24 hr ABM - mean BP 145/58 (range 119-200/44-92).   Left breast mass    PAH (pulmonary artery hypertension) (HCC)    a. 10/2017 Echo: PASP .   RBBB    Reflux    Seasonal allergies    Sleep apnea    wears CPAP nightly   SVD (spontaneous vaginal delivery)    x 3   Past Surgical History:  Procedure Laterality Date   BREAST LUMPECTOMY WITH RADIOACTIVE SEED LOCALIZATION Left 07/19/2018   Procedure: LEFT BREAST LUMPECTOMY WITH RADIOACTIVE SEED LOCALIZATION ERAS PATHWAY;  Surgeon: Laura Deward MOULD, MD;  Location: [facility];  Service: General;  Laterality: Left;   CATARACT EXTRACTION     HYSTEROSCOPY WITH D & C N/A 03/22/2017   Procedure: DILATATION AND CURETTAGE /HYSTEROSCOPY;  Surgeon: Laura Lares, MD;  Location: [facility];  Service: Gynecology;  Laterality: N/A;   lithrotripsy     surgery x 3   right foot surgery     repair tendon   SHOULDER  SURGERY     x 2 - right and left    Allergies  Allergies[1]     History of Present Illness      84 y.o. y/o female female with above complex past medical history including difficult to control hypertension, hyperlipidemia, diabetes, stage II chronic kidney disease, obesity, and GERD. She has had multiple medication intolerances in the setting of trying to get her blood pressure down including amlodipine, hydralazine, HCTZ, doxazosin, and lisinopril. In November 2018, she underwent work-up for secondary hypertension was found to have a normal serum aldosterone/plasma renin activity, no significant renal artery stenosis on renal duplex, and normal LV function on echo. Stress testing was also undertaken and was nonischemic.   In the setting of ongoing elevations of blood pressures with reports of normal pressures at home, she underwent 24-hour blood pressure monitoring in the Spring 2024, which showed labile readings though overall, pressures were mildly elevated.  She was subsequently placed on spironolactone , which resulted in rising BUN/Creat and K (5.1), and spiro was d/c'd.      Laura Garner was last seen in cardiology clinic in May 2025, at which time her weight was down 23 pounds in the setting of Ozempic therapy and with  this, she noted significant improvement in blood pressure management at home with readings 120s-130s on carvedilol, chlorthalidone , clonidine , losartan.   Today she presents with daughter. Is feeling well from cardiac standpoint, without chest pain, shortness of breath, lightheadedness, dizziness. Reports stable, mild bilateral ankle edema at end of day that resolves overnight. Weight down 14lbs from last visit 04/2024. Remains on Ozempic 0.25mg  weekly, which is generally well tolerated however she has had an IBS flare with diarrhea since November and is concerned could be related to GLP1. Has follow up with GI next month. She remains relatively active, walking 0.25 miles twice  daily in her driveway when weather permits, otherwise 10-20 minutes most days on recumbent bike. Most meals are cooked at home, with protein sources from chicken, beef, beans. Home BPs average 110s-120s, with isolated SBP in mid/high 90s at which times she was asymptomatic. Approximately 1 month prior, she noticed her BP cuff indicated irregular heart rhythm, without noted tachycardia and no symptoms. This occurred less than 5 times with no recurrence. She wears CPAP at night. She inquires about discontinuing a BP medication given weight loss and BP readings.   Objective   Home Medications    Current Outpatient Medications  Medication Sig Dispense Refill   carvedilol (COREG) 12.5 MG tablet Take 12.5 mg by mouth 2 (two) times daily with a meal.     chlorthalidone  (HYGROTON ) 25 MG tablet TAKE 1 TABLET (25 MG TOTAL) BY MOUTH DAILY. 90 tablet 2   famotidine  (PEPCID ) 20 MG tablet Take 1 tablet (20 mg total) by mouth 2 (two) times daily. 60 tablet 2   loratadine (CLARITIN) 10 MG tablet Take 10 mg by mouth daily.     losartan (COZAAR) 100 MG tablet Take 100 mg by mouth at bedtime.      Multiple Vitamin (MULTIVITAMIN) capsule Take 1 capsule by mouth daily.      NON FORMULARY Equalactin 2 tabs PO daily     OZEMPIC, 0.25 OR 0.5 MG/DOSE, 2 MG/3ML SOPN Inject into the skin.     Polyethyl Glycol-Propyl Glycol (SYSTANE ULTRA OP) Place 1 drop into both eyes daily as needed (dry eyes).     pravastatin (PRAVACHOL) 20 MG tablet Take 20 mg by mouth daily.     No current facility-administered medications for this visit.     Physical Exam    VS:  BP 138/60 (BP Location: Left Arm, Patient Position: Sitting, Cuff Size: Large)   Pulse 69   Ht 5' 1.5 (1.562 m)   Wt 154 lb 2 oz (69.9 kg)   SpO2 97%   BMI 28.65 kg/m  , BMI Body mass index is 28.65 kg/m.          GEN: Well nourished, well developed, in no acute distress. HEENT: normal. Neck: Supple, no JVD, carotid bruits, or masses. Cardiac: RRR, no  murmurs, rubs, or gallops. No clubbing, cyanosis, edema.  Radials 2+/PT 2+ and equal bilaterally.  Respiratory:  Respirations regular and unlabored, clear to auscultation bilaterally. GI: Soft, nontender, nondistended, BS + x 4. MS: no deformity or atrophy. Skin: warm and dry, no rash. Neuro:  Strength and sensation are intact. Psych: Normal affect.  Accessory Clinical Findings    ECG personally reviewed by me today - EKG Interpretation Date/Time:  Friday January 09 2025 10:20:59 EST Ventricular Rate:  69 PR Interval:  148 QRS Duration:  122 QT Interval:  402 QTC Calculation: 430 R Axis:   -29  Text Interpretation: Normal sinus rhythm Right bundle branch block  Minimal voltage criteria for LVH, may be normal variant ( R in aVL ) Confirmed by Vivienne Bruckner [phone]) on 01/09/2025 10:53:25 AM - no acute changes.  Lab Results  Component Value Date   WBC 8.6 08/29/2022   HGB 10.8 (L) 08/29/2022   HCT 31.3 (L) 08/29/2022   MCV 96.6 08/29/2022   PLT 210 08/29/2022   Lab Results  Component Value Date   CREATININE 1.41 (H) 08/17/2023   BUN 46 (H) 08/17/2023   NA 139 08/17/2023   K 4.4 08/17/2023   CL 100 08/17/2023   CO2 22 08/17/2023   Lab Results  Component Value Date   ALT 13 08/29/2022   AST 20 08/29/2022   ALKPHOS 79 08/29/2022   BILITOT 1.4 (H) 08/29/2022     Lab Results  Component Value Date   HGBA1C 8.9 (H) 03/15/2023     08/2024 lab work (care everywhere):  BUN/Creat 39 / 1.10 K+ 3.8 A1c 6.3%  Hgb/Hct: 10.8 / 33.5 TC 201 LDL 132 Trigs 151    Assessment & Plan    1. Hypertension - She brings with her log of home BP readings from last 2 months ranging 110s-120s, with isolated SBP mid/upper 90s without symptoms, on home meds of: carvedilol 12.5mg  BID, chlorthalidone  25mg  daily, losartan 100mg  daily, clonidine  0.1mg  patch/weekly. Weights have continued to trend down on Ozempic, which have contributed to improvement in BP. She is without symptoms of  lightheadedness, dizziness, headaches, vision changes. Lab work 08/2024: BUN/Creat: 39/1.10, K+ 3.8.  # With improvement in BP and weight, will wean off clonidine  0.1mg  patch. Cut in half x2 weeks then stop  # Continue carvedilol, chlorthalidone , losartan  # Continue monitoring BP at home, contact office if elevated home readings  2. HFpEF - Echo 2018 with LVEF 55-60%, G1DD, no RWMA, mod dilated LA, increased pulm pressures. Denies shortness of breath. Stable mild ankle edema at end of day (none currently).  # Continue: carvedilol 12.5mg  BID, chlorthalidone  25mg  daily, losartan 100mg  daily, ozempic.  3. HLD, goal LDL <70 - Labs 08/2024: LDL 132 (up from 127) on pravastatin 20mg  daily. She reports having tried rosuvastatin in the past with intolerability. Discussed LDL goal of <70 r/t diabetes. Discussed non-statin options of ezetimibe, PCSK9i, Leqvio. She is amendable to additional therapy, however would like to have consult with GI prior to medication changes.  # Anticipate addition of ezetimibe 10mg  daily, pending GI appointment. Patient will update.  # Discussed dietary modifications of plant-based protein sources, incorporating more whole foods, fiber.   4. T2DM - Labs 08/2024: A1c 6.3%, improved from 6.8% on Ozempic monotherapy. Appreciate GLP1 in setting of DM, HFpEF and CV risk reduction. Continue as prescribed.   5. Stage II-III CKD - Labs 08/2024: BUN/Creat 39/1.10, K+ 3.8. Continue losartan.   6. OSA - Notes compliance with CPAP. Continue therapy.   Disp: 6 months with Dr. Mady Bruckner Vivienne, NP 01/09/2025, 1:37 PM     [1]  Allergies Allergen Reactions   Iodinated Contrast Media Anaphylaxis and Other (See Comments)   Amlodipine     Swelling/'foggy sensation'   Avelox [Moxifloxacin Hcl]     Weakness and felt bad   Doxazosin     Explosive diarrhea    Hydralazine     anxiety   Iodine    Moxifloxacin Other (See Comments)   Rosuvastatin     ?   Trimethoprim Other (See  Comments) and Diarrhea    Extreme diarrhea    "

## 2025-01-09 NOTE — Patient Instructions (Signed)
 Medication Instructions:  Your physician recommends the following medication changes.  STOP TAKING: Clonidine  after the 2 week taper down  DECREASE: Clonidine  (CATAPRES ) 0.1 mg/24hr patch. Place half of a  patch onto the skin once a week for 2 weeks  *If you need a refill on your cardiac medications before your next appointment, please call your pharmacy*  Lab Work: None ordered at this time   Follow-Up: At [facility], you and your health needs are our priority.  As part of our continuing mission to provide you with exceptional heart care, our providers are all part of one team.  This team includes your primary Cardiologist (physician) and Advanced Practice Providers or APPs (Physician Assistants and Nurse Practitioners) who all work together to provide you with the care you need, when you need it.  Your next appointment:   6 month(s)  Provider:   You may see Lonni Hanson, MD  We recommend signing up for the patient portal called MyChart.  Sign up information is provided on this After Visit Summary.  MyChart is used to connect with patients for Virtual Visits (Telemedicine).  Patients are able to view lab/test results, encounter notes, upcoming appointments, etc.  Non-urgent messages can be sent to your provider as well.   To learn more about what you can do with MyChart, go to [email or web address].

## 2025-01-19 ENCOUNTER — Ambulatory Visit: Admitting: Internal Medicine

## 2025-04-03 ENCOUNTER — Ambulatory Visit: Admitting: Podiatry

## 2025-05-04 ENCOUNTER — Ambulatory Visit: Admitting: Internal Medicine

## 2025-06-10 ENCOUNTER — Ambulatory Visit: Admitting: Internal Medicine
